# Patient Record
Sex: Male | Born: 1940 | Race: White | Hispanic: No | Marital: Married | Smoking: Current every day smoker
Health system: Southern US, Community
[De-identification: ages and names within clinical notes are randomized; demographics above are authoritative.]

## PROBLEM LIST (undated history)

## (undated) DIAGNOSIS — N183 Chronic kidney disease, stage 3 unspecified: Secondary | ICD-10-CM

## (undated) DIAGNOSIS — I509 Heart failure, unspecified: Secondary | ICD-10-CM

## (undated) DIAGNOSIS — E871 Hypo-osmolality and hyponatremia: Secondary | ICD-10-CM

## (undated) DIAGNOSIS — E7800 Pure hypercholesterolemia, unspecified: Secondary | ICD-10-CM

## (undated) DIAGNOSIS — I4891 Unspecified atrial fibrillation: Secondary | ICD-10-CM

## (undated) DIAGNOSIS — I10 Essential (primary) hypertension: Secondary | ICD-10-CM

## (undated) HISTORY — PX: CORONARY ARTERY BYPASS GRAFT: SHX141

## (undated) HISTORY — PX: HERNIA REPAIR: SHX51

---

## 2005-05-31 ENCOUNTER — Ambulatory Visit: Admission: RE | Admit: 2005-05-31 | Discharge: 2005-08-29 | Payer: Self-pay | Admitting: Radiation Oncology

## 2005-06-10 ENCOUNTER — Encounter: Admission: RE | Admit: 2005-06-10 | Discharge: 2005-06-10 | Payer: Self-pay | Admitting: Plastic Surgery

## 2005-07-21 ENCOUNTER — Ambulatory Visit (HOSPITAL_BASED_OUTPATIENT_CLINIC_OR_DEPARTMENT_OTHER): Admission: RE | Admit: 2005-07-21 | Discharge: 2005-07-21 | Payer: Self-pay | Admitting: Urology

## 2015-12-19 ENCOUNTER — Emergency Department (HOSPITAL_BASED_OUTPATIENT_CLINIC_OR_DEPARTMENT_OTHER): Payer: Commercial Managed Care - HMO

## 2015-12-19 ENCOUNTER — Encounter (HOSPITAL_BASED_OUTPATIENT_CLINIC_OR_DEPARTMENT_OTHER): Payer: Self-pay

## 2015-12-19 ENCOUNTER — Inpatient Hospital Stay (HOSPITAL_BASED_OUTPATIENT_CLINIC_OR_DEPARTMENT_OTHER)
Admission: EM | Admit: 2015-12-19 | Discharge: 2015-12-23 | DRG: 390 | Disposition: A | Discharge status: Home / Self Care | Payer: Commercial Managed Care - HMO | Attending: Surgery | Admitting: Surgery

## 2015-12-19 DIAGNOSIS — Z0189 Encounter for other specified special examinations: Secondary | ICD-10-CM

## 2015-12-19 DIAGNOSIS — K5660 Unspecified intestinal obstruction: Principal | ICD-10-CM | POA: Diagnosis present

## 2015-12-19 DIAGNOSIS — R1031 Right lower quadrant pain: Secondary | ICD-10-CM | POA: Diagnosis not present

## 2015-12-19 DIAGNOSIS — K56609 Unspecified intestinal obstruction, unspecified as to partial versus complete obstruction: Secondary | ICD-10-CM

## 2015-12-19 DIAGNOSIS — E7800 Pure hypercholesterolemia, unspecified: Secondary | ICD-10-CM | POA: Diagnosis present

## 2015-12-19 DIAGNOSIS — Z4659 Encounter for fitting and adjustment of other gastrointestinal appliance and device: Secondary | ICD-10-CM

## 2015-12-19 DIAGNOSIS — I10 Essential (primary) hypertension: Secondary | ICD-10-CM | POA: Diagnosis present

## 2015-12-19 DIAGNOSIS — F17200 Nicotine dependence, unspecified, uncomplicated: Secondary | ICD-10-CM | POA: Diagnosis present

## 2015-12-19 DIAGNOSIS — Z951 Presence of aortocoronary bypass graft: Secondary | ICD-10-CM

## 2015-12-19 HISTORY — DX: Pure hypercholesterolemia, unspecified: E78.00

## 2015-12-19 HISTORY — DX: Essential (primary) hypertension: I10

## 2015-12-19 LAB — BASIC METABOLIC PANEL
Anion gap: 12 (ref 5–15)
BUN: 29 mg/dL — AB (ref 6–20)
CHLORIDE: 103 mmol/L (ref 101–111)
CO2: 24 mmol/L (ref 22–32)
CREATININE: 1.44 mg/dL — AB (ref 0.61–1.24)
Calcium: 10 mg/dL (ref 8.9–10.3)
GFR, EST AFRICAN AMERICAN: 54 mL/min — AB (ref >60)
GFR, EST NON AFRICAN AMERICAN: 46 mL/min — AB (ref >60)
Glucose, Bld: 139 mg/dL — ABNORMAL HIGH (ref 65–99)
POTASSIUM: 4 mmol/L (ref 3.5–5.1)
SODIUM: 139 mmol/L (ref 135–145)

## 2015-12-19 LAB — CBC WITH DIFFERENTIAL/PLATELET
BASOS PCT: 0 %
Basophils Absolute: 0 10*3/uL (ref 0.0–0.1)
EOS ABS: 0 10*3/uL (ref 0.0–0.7)
EOS PCT: 0 %
HCT: 45.5 % (ref 39.0–52.0)
HEMOGLOBIN: 15.5 g/dL (ref 13.0–17.0)
Lymphocytes Relative: 10 %
Lymphs Abs: 0.8 10*3/uL (ref 0.7–4.0)
MCH: 29.3 pg (ref 26.0–34.0)
MCHC: 34.1 g/dL (ref 30.0–36.0)
MCV: 86 fL (ref 78.0–100.0)
Monocytes Absolute: 0.2 10*3/uL (ref 0.1–1.0)
Monocytes Relative: 2 %
NEUTROS PCT: 88 %
Neutro Abs: 7.3 10*3/uL (ref 1.7–7.7)
PLATELETS: 372 10*3/uL (ref 150–400)
RBC: 5.29 MIL/uL (ref 4.22–5.81)
RDW: 14.5 % (ref 11.5–15.5)
WBC: 8.3 10*3/uL (ref 4.0–10.5)

## 2015-12-19 LAB — URINALYSIS, ROUTINE W REFLEX MICROSCOPIC
BILIRUBIN URINE: NEGATIVE
GLUCOSE, UA: NEGATIVE mg/dL
HGB URINE DIPSTICK: NEGATIVE
KETONES UR: 15 mg/dL — AB
Leukocytes, UA: NEGATIVE
NITRITE: NEGATIVE
PH: 6.5 (ref 5.0–8.0)
Protein, ur: 100 mg/dL — AB
SPECIFIC GRAVITY, URINE: 1.035 — AB (ref 1.005–1.030)

## 2015-12-19 LAB — URINE MICROSCOPIC-ADD ON: Bacteria, UA: NONE SEEN

## 2015-12-19 MED ORDER — LIDOCAINE VISCOUS 2 % MT SOLN
15.0000 mL | Freq: Once | OROMUCOSAL | Status: AC
  Administered 2015-12-19: 15 mL via OROMUCOSAL
  Filled 2015-12-19: qty 15

## 2015-12-19 MED ORDER — ONDANSETRON HCL 4 MG/2ML IJ SOLN
4.0000 mg | Freq: Once | INTRAMUSCULAR | Status: AC
  Administered 2015-12-19: 4 mg via INTRAVENOUS
  Filled 2015-12-19: qty 2

## 2015-12-19 MED ORDER — MORPHINE SULFATE (PF) 4 MG/ML IV SOLN
4.0000 mg | INTRAVENOUS | Status: DC | PRN
  Administered 2015-12-19 – 2015-12-20 (×2): 4 mg via INTRAVENOUS
  Filled 2015-12-19 (×2): qty 1

## 2015-12-19 MED ORDER — FENTANYL CITRATE (PF) 100 MCG/2ML IJ SOLN
50.0000 ug | Freq: Once | INTRAMUSCULAR | Status: AC
  Administered 2015-12-19: 50 ug via INTRAVENOUS
  Filled 2015-12-19: qty 2

## 2015-12-19 MED ORDER — IOPAMIDOL (ISOVUE-300) INJECTION 61%
80.0000 mL | Freq: Once | INTRAVENOUS | Status: AC | PRN
  Administered 2015-12-19: 80 mL via INTRAVENOUS

## 2015-12-19 MED ORDER — SODIUM CHLORIDE 0.9 % IV BOLUS (SEPSIS)
500.0000 mL | Freq: Once | INTRAVENOUS | Status: AC
  Administered 2015-12-19: 500 mL via INTRAVENOUS

## 2015-12-19 NOTE — ED Notes (Signed)
MD at bedside. 

## 2015-12-19 NOTE — ED Triage Notes (Addendum)
RLQ pain n/v started today-pt grimacing in pain-squirming in chair-steady gait however taken to tx room in w/c

## 2015-12-19 NOTE — ED Provider Notes (Signed)
Emergency Department Provider Note By signing my name below, I, Emmanuella Mensah, attest that this documentation has been prepared under the direction and in the presence of Maia Plan, MD. Electronically Signed: Angelene Giovanni, ED Scribe. 12/19/15. 9:09 PM.   Maia Plan, MD has reviewed the triage vital signs and the nursing notes.   HISTORY  Chief Complaint Abdominal Pain   HPI Comments: Patrick Knox is a 75 y.o. male with a hx of high cholesterol and hypertension who presents to the Emergency Department complaining of sudden onset of gradually worsening RLQ pain onset 3 pm today. He reports associated nausea and multiple episodes of non-bloody vomiting. He adds that he has not been able to urinate since 2 pm. No alleviating factors noted. Pt has not tried any medications PTA. He reports a hx of hernia repair with a mesh in 2010. No anti-coagulant use but pt takes a daily aspirin. He reports NKDA. He denies any fever, chest pain, SOB, pain in BUE/BUE, groin pain, generalized rash, or any open wounds.   Past Medical History:  Diagnosis Date  . High cholesterol   . Hypertension     Patient Active Problem List   Diagnosis Date Noted  . SBO (small bowel obstruction) (HCC) 12/20/2015  . Hypertension   . High cholesterol     Past Surgical History:  Procedure Laterality Date  . CORONARY ARTERY BYPASS GRAFT    . HERNIA REPAIR        Allergies Review of patient's allergies indicates no known allergies.  No family history on file.  Social History Social History  Substance Use Topics  . Smoking status: Current Every Day Smoker  . Smokeless tobacco: Never Used  . Alcohol use No    Review of Systems Constitutional: No fever/chills Eyes: No visual changes. ENT: No sore throat. Cardiovascular: Denies chest pain. Respiratory: Denies shortness of breath. Gastrointestinal: Abdominal pain. Nausea, Vomiting.  No diarrhea.  No constipation. Genitourinary:  Negative for dysuria. Urinary retention Musculoskeletal: Negative for back pain. Skin: Negative for rash. Neurological: Negative for headaches, focal weakness or numbness.  10-point ROS otherwise negative.  ____________________________________________   PHYSICAL EXAM:  VITAL SIGNS: ED Triage Vitals  Enc Vitals Group     BP 12/19/15 2036 (!) 177/104     Pulse Rate 12/19/15 2036 72     Resp 12/19/15 2036 18     Temp 12/19/15 2036 98.2 F (36.8 C)     Temp Source 12/19/15 2036 Oral     SpO2 12/19/15 2036 100 %     Weight 12/19/15 2036 153 lb (69.4 kg)     Height 12/19/15 2036 6' (1.829 m)     Pain Score 12/19/15 2035 10   Constitutional: Alert and oriented. Appears uncomfortable.  Eyes: Conjunctivae are normal.  Head: Atraumatic. Nose: No congestion/rhinnorhea. Mouth/Throat: Mucous membranes are moist.  Oropharynx non-erythematous. Neck: No stridor.   Cardiovascular: Normal rate, regular rhythm. Good peripheral circulation. Grossly normal heart sounds.   Respiratory: Normal respiratory effort.  No retractions. Lungs CTAB. Gastrointestinal: Soft with significant RLQ tenderness. No rebound but some voluntary guarding. No distention.  Genitourinary: Normal exam. No scrotal mass.  Musculoskeletal: No lower extremity tenderness nor edema. No gross deformities of extremities. Neurologic:  Normal speech and language. No gross focal neurologic deficits are appreciated.  Skin:  Skin is warm, dry and intact. No rash noted. Psychiatric: Mood and affect are normal. Speech and behavior are normal.  ____________________________________________ DIAGNOSTIC STUDIES: Oxygen Saturation is 100% on RA, normal  by my interpretation.    COORDINATION OF CARE: 9:07 PM- Pt advised of plan for treatment and pt agrees. Pt will receive lab work, CT abdomen, and bladder US for further evaluation. He will also receive IV fluids, Fentanyl IM, Zofran IM, ann Morphine IM.    LABS (all labs ordered are  listed, but only abnormal results are displayed)  Labs Reviewed  BASIC METABOLIC PANEL - Abnormal; Notable for the following:       Result Value   Glucose, Bld 139 (*)    BUN 29 (*)    Creatinine, Ser 1.44 (*)    GFR calc non Af Amer 46 (*)    GFR calc Af Amer 54 (*)    All other components within normal limits  URINALYSIS, ROUTINE W REFLEX MICROSCOPIC (NOT AT [facility]) - Abnormal; Notable for the following:    Specific Gravity, Urine 1.035 (*)    Ketones, ur 15 (*)    Protein, ur 100 (*)    All other components within normal limits  URINE MICROSCOPIC-ADD ON - Abnormal; Notable for the following:    Squamous Epithelial / LPF 0-5 (*)    Casts HYALINE CASTS (*)    All other components within normal limits  BASIC METABOLIC PANEL - Abnormal; Notable for the following:    Glucose, Bld 139 (*)    BUN 30 (*)    GFR calc non Af Amer 60 (*)    All other components within normal limits  CBC WITH DIFFERENTIAL/PLATELET  CBC   ____________________________________________  EKG  Reviewed in MUSE. No STEMI.  ____________________________________________  RADIOLOGY  Ct Abdomen Pelvis W Contrast  Result Date: 12/19/2015 CLINICAL DATA:  Right lower quadrant pain, nausea and vomiting starting 3 p.m. today EXAM: CT ABDOMEN AND PELVIS WITH CONTRAST TECHNIQUE: Multidetector CT imaging of the abdomen and pelvis was performed using the standard protocol following bolus administration of intravenous contrast. CONTRAST:  80mL ISOVUE-300 IOPAMIDOL (ISOVUE-300) INJECTION 61% COMPARISON:  None. FINDINGS: Lower chest:  Lung bases are unremarkable. Hepatobiliary: Mild fatty infiltration of the liver. No calcified gallstones are noted within gallbladder. Pancreas: Enhanced pancreas is unremarkable. Spleen: Enhanced spleen is unremarkable. Adrenals/Urinary Tract: No adrenal gland mass. Enhanced kidneys are symmetrical in size. There is a cyst in upper pole of the left kidney measures 9 mm. Delayed renal images shows  bilateral renal symmetrical excretion. Bilateral visualized proximal ureter is unremarkable. The urinary bladder is unremarkable. Stomach/Bowel: Oral contrast material is noted within stomach. There are fluid distended small bowel loops in upper and lower abdomen with multiple air-fluid levels. In axial image 49 there is transition point in caliber of small bowel. The terminal ileum is decompressed small caliber. No any contrast material noted within distal small bowel or within colon. Findings are consistent with small bowel obstruction probable due to adhesions. Moderate stool noted in right colon and cecum. No pericecal inflammation. Normal appendix is partially visualized. Some colonic stool noted within rectum. Vascular/Lymphatic: Atherosclerotic calcifications of abdominal aorta iliac arteries are noted. No aortic aneurysm. No retroperitoneal or mesenteric adenopathy. Reproductive: Radiation seeds are noted in prostate gland. Other: No ascites or free abdominal air. Musculoskeletal: No destructive bony lesions are noted. There are degenerative changes lumbar spine. Multilevel disc space flattening with anterior spurring and vacuum disc phenomenon. IMPRESSION: 1. There are multiple distended small bowel loops with air-fluid levels. Axial image 48 there is transition point in caliber of small bowel in mid upper pelvis. Findings are consistent with small bowel obstruction probable due to adhesions.  Terminal ileum is decompressed small caliber. 2. No pericecal inflammation.  Normal appendix. 3. No hydronephrosis or hydroureter. 4. Atherosclerotic calcifications of abdominal aorta and iliac arteries are noted. 5. Radiation seeds are noted in prostate gland region. 6. These results were called by telephone at the time of interpretation on 12/19/2015 at 10:35 pm to Dr. Alona BeneJOSHUA Thomasenia Dowse , who verbally acknowledged these results. Electronically Signed   By: Natasha MeadLiviu  Pop M.D.   On: 12/19/2015 22:35   Dg Abd Portable  1v  Result Date: 12/19/2015 CLINICAL DATA:  Nasogastric tube placement.  Initial encounter. EXAM: PORTABLE ABDOMEN - 1 VIEW COMPARISON:  CT of the abdomen and pelvis performed earlier today at 10:19 p.m. FINDINGS: The patient's enteric tube is noted ending overlying the body of the stomach, with the side port about the gastroesophageal junction. This could be advanced at least 3 cm. The visualized bowel gas pattern is grossly unremarkable. Contrast is noted within the renal calyces. No free intra-abdominal air is seen, though evaluation for free air is limited on a single supine view. Mild degenerative change is noted along the lower lumbar spine. IMPRESSION: Enteric tube noted ending overlying the body of the stomach, with the side port about the gastroesophageal junction. This could be advanced at least 3 cm. Electronically Signed   By: Roanna RaiderJeffery  Chang M.D.   On: 12/19/2015 23:48   Dg Abd Portable 2v  Result Date: 12/20/2015 CLINICAL DATA:  Small bowel obstruction EXAM: PORTABLE ABDOMEN - 2 VIEW COMPARISON:  Yesterday FINDINGS: Nasogastric tube tip overlaps the proximal stomach. Negative for pneumoperitoneum. Persistent small bowel fluid levels and dilated loops. Stable limited gas within the colon. Persistent excretion of urinary contrast. IMPRESSION: 1. Unchanged pattern of small bowel obstruction. 2. Unchanged nasogastric tube with tip over the proximal stomach. Electronically Signed   By: Marnee SpringJonathon  Watts M.D.   On: 12/20/2015 06:49    ____________________________________________   PROCEDURES  Procedure(s) performed:   Procedures  None ____________________________________________   INITIAL IMPRESSION / ASSESSMENT AND PLAN / ED COURSE  Pertinent labs & imaging results that were available during my care of the patient were reviewed by me and considered in my medical decision making (see chart for details).  Patient resents to the emergency department for evaluation of acute onset right  lower quadrant abdominal discomfort. Patient had some associated nausea. He has tenderness to the lower abdomen worse in the right lower quadrant. No significant distention. Plan for IVF and CT abdomen/pelvis.   10:46 PM Spoke with radiology regarding CT. Patient with a complete SBO with adhesions and transition point in the mid-pelvis. Patient unsure who did his hernia surgery but reports it was some [facility]. No active vomiting but discussed needing to place NG tube and be admitted. [facility] discussion regarding treatment and admission.   11:02 PM Spoke with Dr. Carolynne Edouardoth with general surgery. He is [facility] for him to evaluate there prior to admission. Called and spoke with Dr. Clydene PughKnott, ED attending, who will accept the transfer. Appreciate assistance. Placing NG tube now. Will call [facility]. Patient is HDS.  ____________________________________________  FINAL CLINICAL IMPRESSION(S) / ED DIAGNOSES  Final diagnoses:  SBO (small bowel obstruction) (HCC)     MEDICATIONS GIVEN DURING THIS VISIT:  Medications  heparin injection 5,000 Units (not administered)  dextrose 5 % and 0.9 % NaCl with KCl 20 mEq/L infusion ( Intravenous New Bag/Given 12/20/15 0705)  morphine 2 MG/ML injection 1-2 mg (not administered)  ondansetron (ZOFRAN-ODT) disintegrating tablet 4 mg (not administered)    Or  ondansetron (ZOFRAN) injection 4 mg (not administered)  famotidine (PEPCID) IVPB 20 mg premix (not administered)  diatrizoate meglumine-sodium (GASTROGRAFIN) 66-10 % solution 90 mL (not administered)  lip balm (CARMEX) ointment 1 application (not administered)  phenol (CHLORASEPTIC) mouth spray 2 spray (not administered)  menthol-cetylpyridinium (CEPACOL) lozenge 3 mg (not administered)  magic mouthwash (not administered)  alum & mag hydroxide-simeth (MAALOX/MYLANTA) 200-200-20 MG/5ML suspension  30 mL (not administered)  bisacodyl (DULCOLAX) suppository 10 mg (not administered)  metoprolol (LOPRESSOR) injection 5 mg (not administered)  hydrALAZINE (APRESOLINE) injection 5-20 mg (not administered)  fentaNYL (SUBLIMAZE) injection 50 mcg (50 mcg Intravenous Given 12/19/15 2103)  ondansetron (ZOFRAN) injection 4 mg (4 mg Intravenous Given 12/19/15 2103)  sodium chloride 0.9 % bolus 500 mL (0 mLs Intravenous Stopped 12/19/15 2158)  iopamidol (ISOVUE-300) 61 % injection 80 mL (80 mLs Intravenous Contrast Given 12/19/15 2211)  lidocaine (XYLOCAINE) 2 % viscous mouth solution 15 mL (15 mLs Mouth/Throat Given 12/19/15 2315)  fentaNYL (SUBLIMAZE) injection 50 mcg (50 mcg Intravenous Given 12/20/15 0251)     NEW OUTPATIENT MEDICATIONS STARTED DURING THIS VISIT:  None  Performed with assistance of scribe. I have reviewed the documentation and made changes as necessary.   Note:  This document was prepared using Dragon voice recognition software and may include unintentional dictation errors.  Alona BeneJoshua Hedaya Latendresse, MD Emergency Medicine    Maia PlanJoshua G Destiny Hagin, MD 12/20/15 [phone]

## 2015-12-19 NOTE — ED Notes (Signed)
Bladder scan 134 mL.

## 2015-12-19 NOTE — ED Notes (Signed)
MD at bedside discussing results with patient and family. 

## 2015-12-19 NOTE — ED Notes (Signed)
[facility] at this time. [facility] will arrive in approximately 25 minutes

## 2015-12-19 NOTE — ED Notes (Signed)
After pain medication administration, pt seems much more relaxed. Reports pain is "no better."

## 2015-12-19 NOTE — ED Notes (Signed)
Pt transported to CT at this time.

## 2015-12-20 ENCOUNTER — Inpatient Hospital Stay (HOSPITAL_COMMUNITY): Payer: Commercial Managed Care - HMO

## 2015-12-20 ENCOUNTER — Encounter (HOSPITAL_COMMUNITY): Payer: Self-pay | Admitting: Surgery

## 2015-12-20 DIAGNOSIS — K56609 Unspecified intestinal obstruction, unspecified as to partial versus complete obstruction: Secondary | ICD-10-CM | POA: Diagnosis present

## 2015-12-20 DIAGNOSIS — E7800 Pure hypercholesterolemia, unspecified: Secondary | ICD-10-CM | POA: Diagnosis present

## 2015-12-20 DIAGNOSIS — R1031 Right lower quadrant pain: Secondary | ICD-10-CM | POA: Diagnosis present

## 2015-12-20 DIAGNOSIS — F17200 Nicotine dependence, unspecified, uncomplicated: Secondary | ICD-10-CM | POA: Diagnosis present

## 2015-12-20 DIAGNOSIS — Z951 Presence of aortocoronary bypass graft: Secondary | ICD-10-CM | POA: Diagnosis not present

## 2015-12-20 DIAGNOSIS — K5660 Unspecified intestinal obstruction: Secondary | ICD-10-CM | POA: Diagnosis present

## 2015-12-20 DIAGNOSIS — I10 Essential (primary) hypertension: Secondary | ICD-10-CM | POA: Diagnosis present

## 2015-12-20 LAB — BASIC METABOLIC PANEL
Anion gap: 10 (ref 5–15)
BUN: 30 mg/dL — AB (ref 6–20)
CALCIUM: 9.7 mg/dL (ref 8.9–10.3)
CO2: 25 mmol/L (ref 22–32)
CREATININE: 1.17 mg/dL (ref 0.61–1.24)
Chloride: 104 mmol/L (ref 101–111)
GFR calc Af Amer: >60 mL/min (ref >60)
GFR, EST NON AFRICAN AMERICAN: 60 mL/min — AB (ref >60)
GLUCOSE: 139 mg/dL — AB (ref 65–99)
POTASSIUM: 3.8 mmol/L (ref 3.5–5.1)
SODIUM: 139 mmol/L (ref 135–145)

## 2015-12-20 LAB — CBC
HCT: 43.8 % (ref 39.0–52.0)
Hemoglobin: 14.6 g/dL (ref 13.0–17.0)
MCH: 28.5 pg (ref 26.0–34.0)
MCHC: 33.3 g/dL (ref 30.0–36.0)
MCV: 85.4 fL (ref 78.0–100.0)
PLATELETS: 346 10*3/uL (ref 150–400)
RBC: 5.13 MIL/uL (ref 4.22–5.81)
RDW: 14.7 % (ref 11.5–15.5)
WBC: 8.4 10*3/uL (ref 4.0–10.5)

## 2015-12-20 MED ORDER — LIP MEDEX EX OINT
1.0000 | TOPICAL_OINTMENT | Freq: Two times a day (BID) | CUTANEOUS | Status: DC
Start: 2015-12-20 — End: 2015-12-23
  Administered 2015-12-20 – 2015-12-23 (×7): 1 via TOPICAL
  Filled 2015-12-20: qty 7

## 2015-12-20 MED ORDER — DIATRIZOATE MEGLUMINE & SODIUM 66-10 % PO SOLN
90.0000 mL | Freq: Once | ORAL | Status: AC
  Administered 2015-12-20: 90 mL via NASOGASTRIC
  Filled 2015-12-20: qty 90

## 2015-12-20 MED ORDER — METOPROLOL TARTRATE 5 MG/5ML IV SOLN: 5.0000 mg | Freq: Four times a day (QID) | INTRAVENOUS | Status: DC | PRN

## 2015-12-20 MED ORDER — MENTHOL 3 MG MT LOZG
1.0000 | LOZENGE | OROMUCOSAL | Status: DC | PRN
  Filled 2015-12-20: qty 9

## 2015-12-20 MED ORDER — MORPHINE SULFATE (PF) 2 MG/ML IV SOLN
1.0000 mg | INTRAVENOUS | Status: DC | PRN
  Administered 2015-12-20: 2 mg via INTRAVENOUS
  Filled 2015-12-20: qty 1

## 2015-12-20 MED ORDER — HEPARIN SODIUM (PORCINE) 5000 UNIT/ML IJ SOLN
5000.0000 [IU] | Freq: Three times a day (TID) | INTRAMUSCULAR | Status: DC
  Administered 2015-12-20 – 2015-12-23 (×9): 5000 [IU] via SUBCUTANEOUS
  Filled 2015-12-20 (×9): qty 1

## 2015-12-20 MED ORDER — BISACODYL 10 MG RE SUPP: 10.0000 mg | Freq: Two times a day (BID) | RECTAL | Status: DC | PRN

## 2015-12-20 MED ORDER — KCL IN DEXTROSE-NACL 20-5-0.9 MEQ/L-%-% IV SOLN
INTRAVENOUS | Status: DC
  Administered 2015-12-20 – 2015-12-23 (×7): via INTRAVENOUS
  Filled 2015-12-20 (×9): qty 1000

## 2015-12-20 MED ORDER — FAMOTIDINE IN NACL 20-0.9 MG/50ML-% IV SOLN
20.0000 mg | Freq: Two times a day (BID) | INTRAVENOUS | Status: DC
  Administered 2015-12-20 – 2015-12-22 (×6): 20 mg via INTRAVENOUS
  Filled 2015-12-20 (×7): qty 50

## 2015-12-20 MED ORDER — MAGIC MOUTHWASH
15.0000 mL | Freq: Four times a day (QID) | ORAL | Status: DC | PRN
  Filled 2015-12-20: qty 15

## 2015-12-20 MED ORDER — ONDANSETRON 4 MG PO TBDP: 4.0000 mg | ORAL_TABLET | Freq: Four times a day (QID) | ORAL | Status: DC | PRN

## 2015-12-20 MED ORDER — FENTANYL CITRATE (PF) 100 MCG/2ML IJ SOLN
50.0000 ug | Freq: Once | INTRAMUSCULAR | Status: AC
  Administered 2015-12-20: 50 ug via INTRAVENOUS
  Filled 2015-12-20: qty 2

## 2015-12-20 MED ORDER — HYDRALAZINE HCL 20 MG/ML IJ SOLN: 5.0000 mg | Freq: Four times a day (QID) | INTRAMUSCULAR | Status: DC | PRN

## 2015-12-20 MED ORDER — ONDANSETRON HCL 4 MG/2ML IJ SOLN
4.0000 mg | Freq: Four times a day (QID) | INTRAMUSCULAR | Status: DC | PRN
Start: 2015-12-20 — End: 2015-12-23
  Administered 2015-12-20 – 2015-12-21 (×2): 4 mg via INTRAVENOUS
  Filled 2015-12-20 (×2): qty 2

## 2015-12-20 MED ORDER — PHENOL 1.4 % MT LIQD
2.0000 | OROMUCOSAL | Status: DC | PRN
  Filled 2015-12-20: qty 177

## 2015-12-20 MED ORDER — ALUM & MAG HYDROXIDE-SIMETH 200-200-20 MG/5ML PO SUSP: 30.0000 mL | Freq: Four times a day (QID) | ORAL | Status: DC | PRN

## 2015-12-20 NOTE — ED Notes (Signed)
[facility]

## 2015-12-20 NOTE — H&P (Signed)
Patrick Knox is an 75 y.o. male.   Chief Complaint: vomiting HPI:  The patient is a 75 year old black male who began feeling bad yesterday afternoon. He describes some lower abdominal pain. He has also been having some nausea and vomiting. His last bowel movement was yesterday. He has never had an episode like this before. He denies any fevers or chills. He went to the emergency department where a CT scan was consistent with a small bowel obstruction. He has never had abdominal surgery before. An NG tube is in place.  Past Medical History:  Diagnosis Date  . High cholesterol   . Hypertension     Past Surgical History:  Procedure Laterality Date  . CORONARY ARTERY BYPASS GRAFT    . HERNIA REPAIR      No family history on file. Social History:  reports that he has been smoking.  He has never used smokeless tobacco. He reports that he does not drink alcohol or use drugs.  Allergies: No Known Allergies   (Not in a hospital admission)  Results for orders placed or performed during the hospital encounter of 12/19/15 (from the past 48 hour(s))  CBC with Differential     Status: None   Collection Time: 12/19/15  8:52 PM  Result Value Ref Range   WBC 8.3 4.0 - 10.5 K/uL   RBC 5.29 4.22 - 5.81 MIL/uL   Hemoglobin 15.5 13.0 - 17.0 g/dL   HCT 45.5 39.0 - 52.0 %   MCV 86.0 78.0 - 100.0 fL   MCH 29.3 26.0 - 34.0 pg   MCHC 34.1 30.0 - 36.0 g/dL   RDW 14.5 11.5 - 15.5 %   Platelets 372 150 - 400 K/uL   Neutrophils Relative % 88 %   Neutro Abs 7.3 1.7 - 7.7 K/uL   Lymphocytes Relative 10 %   Lymphs Abs 0.8 0.7 - 4.0 K/uL   Monocytes Relative 2 %   Monocytes Absolute 0.2 0.1 - 1.0 K/uL   Eosinophils Relative 0 %   Eosinophils Absolute 0.0 0.0 - 0.7 K/uL   Basophils Relative 0 %   Basophils Absolute 0.0 0.0 - 0.1 K/uL  Basic metabolic panel     Status: Abnormal   Collection Time: 12/19/15  8:52 PM  Result Value Ref Range   Sodium 139 135 - 145 mmol/L   Potassium 4.0 3.5 - 5.1 mmol/L    Chloride 103 101 - 111 mmol/L   CO2 24 22 - 32 mmol/L   Glucose, Bld 139 (H) 65 - 99 mg/dL   BUN 29 (H) 6 - 20 mg/dL   Creatinine, Ser 1.44 (H) 0.61 - 1.24 mg/dL   Calcium 10.0 8.9 - 10.3 mg/dL   GFR calc non Af Amer 46 (L) >60 mL/min   GFR calc Af Amer 54 (L) >60 mL/min    Comment: (NOTE) The eGFR has been calculated using the CKD EPI equation. This calculation has not been validated in all clinical situations. eGFR's persistently <60 mL/min signify possible Chronic Kidney Disease.    Anion gap 12 5 - 15  Urinalysis, Routine w reflex microscopic (not at [facility])     Status: Abnormal   Collection Time: 12/19/15 10:35 PM  Result Value Ref Range   Color, Urine YELLOW YELLOW   APPearance CLEAR CLEAR   Specific Gravity, Urine 1.035 (H) 1.005 - 1.030   pH 6.5 5.0 - 8.0   Glucose, UA NEGATIVE NEGATIVE mg/dL   Hgb urine dipstick NEGATIVE NEGATIVE   Bilirubin Urine NEGATIVE  NEGATIVE   Ketones, ur 15 (A) NEGATIVE mg/dL   Protein, ur 100 (A) NEGATIVE mg/dL   Nitrite NEGATIVE NEGATIVE   Leukocytes, UA NEGATIVE NEGATIVE  Urine microscopic-add on     Status: Abnormal   Collection Time: 12/19/15 10:35 PM  Result Value Ref Range   Squamous Epithelial / LPF 0-5 (A) NONE SEEN   WBC, UA 0-5 0 - 5 WBC/hpf   RBC / HPF 0-5 0 - 5 RBC/hpf   Bacteria, UA NONE SEEN NONE SEEN   Casts HYALINE CASTS (A) NEGATIVE    Comment: GRANULAR CAST   Ct Abdomen Pelvis W Contrast  Result Date: 12/19/2015 CLINICAL DATA:  Right lower quadrant pain, nausea and vomiting starting 3 p.m. today EXAM: CT ABDOMEN AND PELVIS WITH CONTRAST TECHNIQUE: Multidetector CT imaging of the abdomen and pelvis was performed using the standard protocol following bolus administration of intravenous contrast. CONTRAST:  4m ISOVUE-300 IOPAMIDOL (ISOVUE-300) INJECTION 61% COMPARISON:  None. FINDINGS: Lower chest:  Lung bases are unremarkable. Hepatobiliary: Mild fatty infiltration of the liver. No calcified gallstones are noted within  gallbladder. Pancreas: Enhanced pancreas is unremarkable. Spleen: Enhanced spleen is unremarkable. Adrenals/Urinary Tract: No adrenal gland mass. Enhanced kidneys are symmetrical in size. There is a cyst in upper pole of the left kidney measures 9 mm. Delayed renal images shows bilateral renal symmetrical excretion. Bilateral visualized proximal ureter is unremarkable. The urinary bladder is unremarkable. Stomach/Bowel: Oral contrast material is noted within stomach. There are fluid distended small bowel loops in upper and lower abdomen with multiple air-fluid levels. In axial image 49 there is transition point in caliber of small bowel. The terminal ileum is decompressed small caliber. No any contrast material noted within distal small bowel or within colon. Findings are consistent with small bowel obstruction probable due to adhesions. Moderate stool noted in right colon and cecum. No pericecal inflammation. Normal appendix is partially visualized. Some colonic stool noted within rectum. Vascular/Lymphatic: Atherosclerotic calcifications of abdominal aorta iliac arteries are noted. No aortic aneurysm. No retroperitoneal or mesenteric adenopathy. Reproductive: Radiation seeds are noted in prostate gland. Other: No ascites or free abdominal air. Musculoskeletal: No destructive bony lesions are noted. There are degenerative changes lumbar spine. Multilevel disc space flattening with anterior spurring and vacuum disc phenomenon. IMPRESSION: 1. There are multiple distended small bowel loops with air-fluid levels. Axial image 48 there is transition point in caliber of small bowel in mid upper pelvis. Findings are consistent with small bowel obstruction probable due to adhesions. Terminal ileum is decompressed small caliber. 2. No pericecal inflammation.  Normal appendix. 3. No hydronephrosis or hydroureter. 4. Atherosclerotic calcifications of abdominal aorta and iliac arteries are noted. 5. Radiation seeds are noted in  prostate gland region. 6. These results were called by telephone at the time of interpretation on 12/19/2015 at 10:35 pm to Dr. JNanda Quinton, who verbally acknowledged these results. Electronically Signed   By: LLahoma CrockerM.D.   On: 12/19/2015 22:35   Dg Abd Portable 1v  Result Date: 12/19/2015 CLINICAL DATA:  Nasogastric tube placement.  Initial encounter. EXAM: PORTABLE ABDOMEN - 1 VIEW COMPARISON:  CT of the abdomen and pelvis performed earlier today at 10:19 p.m. FINDINGS: The patient's enteric tube is noted ending overlying the body of the stomach, with the side port about the gastroesophageal junction. This could be advanced at least 3 cm. The visualized bowel gas pattern is grossly unremarkable. Contrast is noted within the renal calyces. No free intra-abdominal air is seen, though evaluation for  free air is limited on a single supine view. Mild degenerative change is noted along the lower lumbar spine. IMPRESSION: Enteric tube noted ending overlying the body of the stomach, with the side port about the gastroesophageal junction. This could be advanced at least 3 cm. Electronically Signed   By: Garald Balding M.D.   On: 12/19/2015 23:48    Review of Systems  Constitutional: Negative.   HENT: Negative.   Eyes: Negative.   Respiratory: Negative.   Cardiovascular: Negative.   Gastrointestinal: Positive for abdominal pain, nausea and vomiting.  Genitourinary: Negative.   Musculoskeletal: Negative.   Skin: Negative.   Neurological: Negative.   Endo/Heme/Allergies: Negative.   Psychiatric/Behavioral: Negative.     Blood pressure 172/92, pulse 67, temperature 98.4 F (36.9 C), resp. rate 14, height 6' (1.829 m), weight 69.4 kg (153 lb), SpO2 99 %. Physical Exam  Constitutional: He is oriented to person, place, and time. He appears well-developed and well-nourished.  HENT:  Head: Normocephalic and atraumatic.  Eyes: Conjunctivae and EOM are normal. Pupils are equal, round, and reactive to  light.  Neck: Normal range of motion. Neck supple.  Cardiovascular: Normal rate, regular rhythm and normal heart sounds.   Respiratory: Effort normal and breath sounds normal.  GI: Soft.  There is no distension. There is mild RLQ tenderness but no guarding  Musculoskeletal: Normal range of motion.  Neurological: He is alert and oriented to person, place, and time.  Skin: Skin is warm and dry.  Psychiatric: He has a normal mood and affect. His behavior is normal.     Assessment/Plan  The patient appears to have a small bowel obstruction. The etiology of this is unclear given that he has not had previous abdominal surgery. The CT scan does not show any evidence of hernia or mass. I would agree with admitting him for bowel rest and close monitoring. We will consider starting the small bowel protocol in the morning after repeating his abdominal x-rays.  Merrie Roof, MD 12/20/2015, 3:47 AM

## 2015-12-20 NOTE — ED Notes (Signed)
Report given to PorterBobby, RN 6E.

## 2015-12-20 NOTE — Progress Notes (Signed)
[facility]  [address] [place], [place] [address] Phone: [phone] FAX: [phone]   Patrick Knox [id number] [birth year]  CARE TEAM:  PCP: Irven Shelling, MD  Outpatient Care Team: Patient Care Team: Lavone Orn, MD as PCP - General (Internal Medicine)  Inpatient Treatment Team: Treatment Team: Attending Provider: Nolon Nations, MD; Attending Physician: Nolon Nations, MD; Registered Nurse: Yvette Rack, RN; Technician: Jefm Bryant, NT  Problem List:   Active Problems:   SBO (small bowel obstruction) ([place])           Assessment  Persistent SBO  Plan:  -NGT -IVF -SBO protocol -HTN control - PRN IV med -VTE prophylaxis- SCDs, etc -mobilize as tolerated to help recovery  [name], M.D., F.A.C.S. Gastrointestinal and Minimally Invasive Surgery [facility] [address], [place], [place] [address] [phone] Main / Paging   12/20/2015  Subjective:  Sleeping No emesis  Objective:  Vital signs:  Vitals:   12/19/15 2321 12/19/15 2333 12/20/15 0504 12/20/15 0549  BP: 173/95 172/92 189/95 183/87  Pulse: 67 67 71 65  Resp: _0 Temp:  98.4 F (36.9 C)  99.3 F (37.4 C)  TempSrc:    Oral  SpO2: 99% 99% 99% 100%  Weight:      Height:        Last BM Date: 12/19/15  Intake/Output   Yesterday:  08/18 0701 - 08/19 0700 In: -  Out: 475 [Emesis/NG output:475] This shift:  No intake/output data recorded.  Bowel function:  Flatus: No  BM:  No  Drain: NGT with old contrast   Physical Exam:  General: Pt awake/alert/oriented x4 in No acute distress Eyes: PERRL, normal EOM.  Sclera clear.  No icterus Neuro: CN II-XII intact w/o focal sensory/motor deficits. Lymph: No head/neck/groin lymphadenopathy Psych:  No delerium/psychosis/paranoia HENT: Normocephalic, Mucus membranes moist.  No thrush Neck: Supple, No tracheal deviation Chest: No  chest wall pain w good excursion CV:  Pulses intact.  Regular rhythm MS: Normal AROM mjr joints.  No obvious deformity Abdomen: Soft.  Moderately distended.  Nontender.  No evidence of peritonitis.  No incarcerated hernias. Ext:  SCDs BLE.  No mjr edema.  No cyanosis Skin: No petechiae / purpura  Results:   Labs: Results for orders placed or performed during the hospital encounter of 12/19/15 (from the past 48 hour(s))  CBC with Differential     Status: None   Collection Time: 12/19/15  8:52 PM  Result Value Ref Range   WBC 8.3 4.0 - 10.5 K/uL   RBC 5.29 4.22 - 5.81 MIL/uL   Hemoglobin 15.5 13.0 - 17.0 g/dL   HCT 45.5 39.0 - 52.0 %   MCV 86.0 78.0 - 100.0 fL   MCH 29.3 26.0 - 34.0 pg   MCHC 34.1 30.0 - 36.0 g/dL   RDW 14.5 11.5 - 15.5 %   Platelets 372 150 - 400 K/uL   Neutrophils Relative % 88 %   Neutro Abs 7.3 1.7 - 7.7 K/uL   Lymphocytes Relative 10 %   Lymphs Abs 0.8 0.7 - 4.0 K/uL   Monocytes Relative 2 %   Monocytes Absolute 0.2 0.1 - 1.0 K/uL   Eosinophils Relative 0 %   Eosinophils Absolute 0.0 0.0 - 0.7 K/uL   Basophils Relative 0 %   Basophils Absolute 0.0 0.0 - 0.1 K/uL  Basic metabolic panel     Status: Abnormal   Collection Time:  12/19/15  8:52 PM  Result Value Ref Range   Sodium 139 135 - 145 mmol/L   Potassium 4.0 3.5 - 5.1 mmol/L   Chloride 103 101 - 111 mmol/L   CO2 24 22 - 32 mmol/L   Glucose, Bld 139 (H) 65 - 99 mg/dL   BUN 29 (H) 6 - 20 mg/dL   Creatinine, Ser 1.44 (H) 0.61 - 1.24 mg/dL   Calcium 10.0 8.9 - 10.3 mg/dL   GFR calc non Af Amer 46 (L) >60 mL/min   GFR calc Af Amer 54 (L) >60 mL/min    Comment: (NOTE) The eGFR has been calculated using the CKD EPI equation. This calculation has not been validated in all clinical situations. eGFR's persistently <60 mL/min signify possible Chronic Kidney Disease.    Anion gap 12 5 - 15  Urinalysis, Routine w reflex microscopic (not at [facility])     Status: Abnormal   Collection Time: 12/19/15 10:35 PM   Result Value Ref Range   Color, Urine YELLOW YELLOW   APPearance CLEAR CLEAR   Specific Gravity, Urine 1.035 (H) 1.005 - 1.030   pH 6.5 5.0 - 8.0   Glucose, UA NEGATIVE NEGATIVE mg/dL   Hgb urine dipstick NEGATIVE NEGATIVE   Bilirubin Urine NEGATIVE NEGATIVE   Ketones, ur 15 (A) NEGATIVE mg/dL   Protein, ur 100 (A) NEGATIVE mg/dL   Nitrite NEGATIVE NEGATIVE   Leukocytes, UA NEGATIVE NEGATIVE  Urine microscopic-add on     Status: Abnormal   Collection Time: 12/19/15 10:35 PM  Result Value Ref Range   Squamous Epithelial / LPF 0-5 (A) NONE SEEN   WBC, UA 0-5 0 - 5 WBC/hpf   RBC / HPF 0-5 0 - 5 RBC/hpf   Bacteria, UA NONE SEEN NONE SEEN   Casts HYALINE CASTS (A) NEGATIVE    Comment: GRANULAR CAST  Basic metabolic panel     Status: Abnormal   Collection Time: 12/20/15  6:00 AM  Result Value Ref Range   Sodium 139 135 - 145 mmol/L   Potassium 3.8 3.5 - 5.1 mmol/L   Chloride 104 101 - 111 mmol/L   CO2 25 22 - 32 mmol/L   Glucose, Bld 139 (H) 65 - 99 mg/dL   BUN 30 (H) 6 - 20 mg/dL   Creatinine, Ser 1.17 0.61 - 1.24 mg/dL   Calcium 9.7 8.9 - 10.3 mg/dL   GFR calc non Af Amer 60 (L) >60 mL/min   GFR calc Af Amer >60 >60 mL/min    Comment: (NOTE) The eGFR has been calculated using the CKD EPI equation. This calculation has not been validated in all clinical situations. eGFR's persistently <60 mL/min signify possible Chronic Kidney Disease.    Anion gap 10 5 - 15  CBC     Status: None   Collection Time: 12/20/15  6:00 AM  Result Value Ref Range   WBC 8.4 4.0 - 10.5 K/uL   RBC 5.13 4.22 - 5.81 MIL/uL   Hemoglobin 14.6 13.0 - 17.0 g/dL   HCT 43.8 39.0 - 52.0 %   MCV 85.4 78.0 - 100.0 fL   MCH 28.5 26.0 - 34.0 pg   MCHC 33.3 30.0 - 36.0 g/dL   RDW 14.7 11.5 - 15.5 %   Platelets 346 150 - 400 K/uL    Imaging / Studies: Ct Abdomen Pelvis W Contrast  Result Date: 12/19/2015 CLINICAL DATA:  Right lower quadrant pain, nausea and vomiting starting 3 p.m. today EXAM: CT  ABDOMEN AND PELVIS WITH  CONTRAST TECHNIQUE: Multidetector CT imaging of the abdomen and pelvis was performed using the standard protocol following bolus administration of intravenous contrast. CONTRAST:  23m ISOVUE-300 IOPAMIDOL (ISOVUE-300) INJECTION 61% COMPARISON:  None. FINDINGS: Lower chest:  Lung bases are unremarkable. Hepatobiliary: Mild fatty infiltration of the liver. No calcified gallstones are noted within gallbladder. Pancreas: Enhanced pancreas is unremarkable. Spleen: Enhanced spleen is unremarkable. Adrenals/Urinary Tract: No adrenal gland mass. Enhanced kidneys are symmetrical in size. There is a cyst in upper pole of the left kidney measures 9 mm. Delayed renal images shows bilateral renal symmetrical excretion. Bilateral visualized proximal ureter is unremarkable. The urinary bladder is unremarkable. Stomach/Bowel: Oral contrast material is noted within stomach. There are fluid distended small bowel loops in upper and lower abdomen with multiple air-fluid levels. In axial image 49 there is transition point in caliber of small bowel. The terminal ileum is decompressed small caliber. No any contrast material noted within distal small bowel or within colon. Findings are consistent with small bowel obstruction probable due to adhesions. Moderate stool noted in right colon and cecum. No pericecal inflammation. Normal appendix is partially visualized. Some colonic stool noted within rectum. Vascular/Lymphatic: Atherosclerotic calcifications of abdominal aorta iliac arteries are noted. No aortic aneurysm. No retroperitoneal or mesenteric adenopathy. Reproductive: Radiation seeds are noted in prostate gland. Other: No ascites or free abdominal air. Musculoskeletal: No destructive bony lesions are noted. There are degenerative changes lumbar spine. Multilevel disc space flattening with anterior spurring and vacuum disc phenomenon. IMPRESSION: 1. There are multiple distended small bowel loops with  air-fluid levels. Axial image 48 there is transition point in caliber of small bowel in mid upper pelvis. Findings are consistent with small bowel obstruction probable due to adhesions. Terminal ileum is decompressed small caliber. 2. No pericecal inflammation.  Normal appendix. 3. No hydronephrosis or hydroureter. 4. Atherosclerotic calcifications of abdominal aorta and iliac arteries are noted. 5. Radiation seeds are noted in prostate gland region. 6. These results were called by telephone at the time of interpretation on 12/19/2015 at 10:35 pm to Dr. JNanda Quinton, who verbally acknowledged these results. Electronically Signed   By: LLahoma CrockerM.D.   On: 12/19/2015 22:35   Dg Abd Portable 1v  Result Date: 12/19/2015 CLINICAL DATA:  Nasogastric tube placement.  Initial encounter. EXAM: PORTABLE ABDOMEN - 1 VIEW COMPARISON:  CT of the abdomen and pelvis performed earlier today at 10:19 p.m. FINDINGS: The patient's enteric tube is noted ending overlying the body of the stomach, with the side port about the gastroesophageal junction. This could be advanced at least 3 cm. The visualized bowel gas pattern is grossly unremarkable. Contrast is noted within the renal calyces. No free intra-abdominal air is seen, though evaluation for free air is limited on a single supine view. Mild degenerative change is noted along the lower lumbar spine. IMPRESSION: Enteric tube noted ending overlying the body of the stomach, with the side port about the gastroesophageal junction. This could be advanced at least 3 cm. Electronically Signed   By: JGarald BaldingM.D.   On: 12/19/2015 23:48   Dg Abd Portable 2v  Result Date: 12/20/2015 CLINICAL DATA:  Small bowel obstruction EXAM: PORTABLE ABDOMEN - 2 VIEW COMPARISON:  Yesterday FINDINGS: Nasogastric tube tip overlaps the proximal stomach. Negative for pneumoperitoneum. Persistent small bowel fluid levels and dilated loops. Stable limited gas within the colon. Persistent excretion  of urinary contrast. IMPRESSION: 1. Unchanged pattern of small bowel obstruction. 2. Unchanged nasogastric tube with tip over the  proximal stomach. Electronically Signed   By: Monte Fantasia M.D.   On: 12/20/2015 06:49    Medications / Allergies: per chart  Antibiotics: Anti-infectives    None        Note: Portions of this report may have been transcribed using voice recognition software. Every effort was made to ensure accuracy; however, inadvertent computerized transcription errors may be present.   Any transcriptional errors that result from this process are unintentional.     [name], M.D., F.A.C.S. Gastrointestinal and Minimally Invasive Surgery [facility] [address] [place], [place] [address] [phone] Main / Paging   12/20/2015

## 2015-12-20 NOTE — Progress Notes (Signed)
Pt received gastrografin this morning at 0830. He was clamped for two hours and he reported having nausea so was placed back on LIWS and given zofran. Patient hasn't wanted to ambulate, as he says he doesn't feel well. Post 8-hour xray showed the dye in the stomach and not the bowel. Patient still on LIWS.

## 2015-12-20 NOTE — Care Management Note (Signed)
Case Management Note  Patient Details  Name: Lanice SchwabHarvie Nabi MRN: [id number] Date of Birth: 07/14/1940  Subjective/Objective:  Small bowel obstruction                  Action/Plan: Discharge Planning: Chart reviewed. Waiting final recommendations for home. CM will continue to follow for dc needs.   PCP- Kirby FunkGRIFFIN, JOHN MD   Expected Discharge Date:                Expected Discharge Plan:  Home/Self Care  In-House Referral:  NA  Discharge planning Services  CM Consult  Post Acute Care Choice:  NA Choice offered to:  NA  DME Arranged:  N/A DME Agency:  NA  HH Arranged:    HH Agency:     Status of Service:  In process, will continue to follow  If discussed at Long Length of Stay Meetings, dates discussed:    Additional Comments:  Elliot CousinShavis, Asmi Fugere Ellen, RN 12/20/2015, 3:48 PM

## 2015-12-20 NOTE — ED Notes (Signed)
[facility] here at this time 

## 2015-12-20 NOTE — ED Provider Notes (Signed)
Patient was seen and evaluated on arrival to the emergency department.  Dr Carolynne Edouardoth called and will come to evaluate as he accepted Pt.    Lyndal Pulleyaniel Willson Lipa, MD 12/20/15 [phone]

## 2015-12-20 NOTE — ED Notes (Signed)
Attempted report x1, RN in a patient room performing patient care.

## 2015-12-21 MED ORDER — ACETAMINOPHEN 650 MG RE SUPP: 650.0000 mg | Freq: Four times a day (QID) | RECTAL | Status: DC | PRN

## 2015-12-21 MED ORDER — ACETAMINOPHEN 325 MG PO TABS: 325.0000 mg | ORAL_TABLET | Freq: Four times a day (QID) | ORAL | Status: DC | PRN

## 2015-12-21 MED ORDER — METHOCARBAMOL 1000 MG/10ML IJ SOLN
1000.0000 mg | Freq: Four times a day (QID) | INTRAVENOUS | Status: DC | PRN
  Filled 2015-12-21: qty 10

## 2015-12-21 MED ORDER — BISACODYL 10 MG RE SUPP
10.0000 mg | Freq: Every day | RECTAL | Status: DC
  Administered 2015-12-21 – 2015-12-22 (×2): 10 mg via RECTAL
  Filled 2015-12-21 (×2): qty 1

## 2015-12-21 MED ORDER — HYDROMORPHONE HCL 1 MG/ML IJ SOLN: 0.5000 mg | INTRAMUSCULAR | Status: DC | PRN

## 2015-12-21 NOTE — Progress Notes (Signed)
Patrick [address]  [address] [place], [place] [address] Phone: [phone] FAX: [phone]   Patrick Knox [id number] May 15, 1940  CARE TEAM:  PCP: Irven Shelling, MD  Outpatient Care Team: Patient Care Team: Lavone Orn, MD as PCP - General (Internal Medicine)  Inpatient Treatment Team: Treatment Team: Attending Provider: Nolon Nations, MD; Attending Physician: Nolon Nations, MD; Registered Nurse: Yvette Rack, RN; Technician: Jefm Bryant, NT; Technician: April R Juarez, NT; Registered Nurse: Bobbye Charleston, RN  Problem List:   Active Problems:   SBO (small bowel obstruction) ([place])   Hypertension   High cholesterol           Assessment  Persistent SBO - ? better clinically but not by Xray  Plan:  -SBO protocol:  check Xray in AM  If Xray not better & no flatus, may need OR exploration tomorrow.  If better, may try PO.  We will see... -NGT needs to be advanced in - d/w RN -IVF  -HTN control - PRN IV med OK for now -VTE prophylaxis- SCDs, etc -mobilize as tolerated to help recovery  Patrick Knox, M.D., F.A.C.S. Gastrointestinal and Minimally Invasive Surgery [facility] [address], [place], [place] [address] [phone] Main / Paging   12/21/2015  Subjective:  Felt worse but now better No emesis  Objective:  Vital signs:  Vitals:   12/20/15 1403 12/20/15 1833 12/20/15 2311 12/21/15 0613  BP: (!) 180/88 (!) 153/73 (!) 171/68 (!) 160/67  Pulse:  (!) 40  (!) 36  Resp: _0 Temp: 98.6 F (37 C) 98.3 F (36.8 C) 98.9 F (37.2 C) 98.6 F (37 C)  TempSrc: Oral Oral Oral Oral  SpO2: 100% 100% 100% 100%  Weight:      Height:        Last BM Date: 12/19/15  Intake/Output   Yesterday:  08/19 0701 - 08/20 0700 In: 4742 [I.V.:1485; IV Piggyback:100] Out: 1450 [Urine:700; Emesis/NG output:750] This shift:  Total I/O In: -  Out: 200  [Urine:200]  Bowel function:  Flatus: A little bit  BM:  No  Drain: NGT with thick brown/tan output   Physical Exam:  General: Pt awake/alert/oriented x4 in No acute distress Eyes: PERRL, normal EOM.  Sclera clear.  No icterus Neuro: CN II-XII intact w/o focal sensory/motor deficits. Lymph: No head/neck/groin lymphadenopathy Psych:  No delerium/psychosis/paranoia HENT: Normocephalic, Mucus membranes moist.  No thrush Neck: Supple, No tracheal deviation Chest: No chest wall pain w good excursion CV:  Pulses intact.  Regular rhythm MS: Normal AROM mjr joints.  No obvious deformity Abdomen: Soft.  Mildy distended.  Nontender.  No evidence of peritonitis.  No incarcerated hernias. Ext:  SCDs BLE.  No mjr edema.  No cyanosis Skin: No petechiae / purpura  Results:   Labs: Results for orders placed or performed during the hospital encounter of 12/19/15 (from the past 48 hour(s))  CBC with Differential     Status: None   Collection Time: 12/19/15  8:52 PM  Result Value Ref Range   WBC 8.3 4.0 - 10.5 K/uL   RBC 5.29 4.22 - 5.81 MIL/uL   Hemoglobin 15.5 13.0 - 17.0 g/dL   HCT 45.5 39.0 - 52.0 %   MCV 86.0 78.0 - 100.0 fL   MCH 29.3 26.0 - 34.0 pg   MCHC 34.1 30.0 - 36.0 g/dL   RDW 14.5 11.5 - 15.5 %   Platelets 372 150 - 400  K/uL   Neutrophils Relative % 88 %   Neutro Abs 7.3 1.7 - 7.7 K/uL   Lymphocytes Relative 10 %   Lymphs Abs 0.8 0.7 - 4.0 K/uL   Monocytes Relative 2 %   Monocytes Absolute 0.2 0.1 - 1.0 K/uL   Eosinophils Relative 0 %   Eosinophils Absolute 0.0 0.0 - 0.7 K/uL   Basophils Relative 0 %   Basophils Absolute 0.0 0.0 - 0.1 K/uL  Basic metabolic panel     Status: Abnormal   Collection Time: 12/19/15  8:52 PM  Result Value Ref Range   Sodium 139 135 - 145 mmol/L   Potassium 4.0 3.5 - 5.1 mmol/L   Chloride 103 101 - 111 mmol/L   CO2 24 22 - 32 mmol/L   Glucose, Bld 139 (H) 65 - 99 mg/dL   BUN 29 (H) 6 - 20 mg/dL   Creatinine, Ser 1.44 (H) 0.61 - 1.24  mg/dL   Calcium 10.0 8.9 - 10.3 mg/dL   GFR calc non Af Amer 46 (L) >60 mL/min   GFR calc Af Amer 54 (L) >60 mL/min    Comment: (NOTE) The eGFR has been calculated using the CKD EPI equation. This calculation has not been validated in all clinical situations. eGFR's persistently <60 mL/min signify possible Chronic Kidney Disease.    Anion gap 12 5 - 15  Urinalysis, Routine w reflex microscopic (not at [facility])     Status: Abnormal   Collection Time: 12/19/15 10:35 PM  Result Value Ref Range   Color, Urine YELLOW YELLOW   APPearance CLEAR CLEAR   Specific Gravity, Urine 1.035 (H) 1.005 - 1.030   pH 6.5 5.0 - 8.0   Glucose, UA NEGATIVE NEGATIVE mg/dL   Hgb urine dipstick NEGATIVE NEGATIVE   Bilirubin Urine NEGATIVE NEGATIVE   Ketones, ur 15 (A) NEGATIVE mg/dL   Protein, ur 100 (A) NEGATIVE mg/dL   Nitrite NEGATIVE NEGATIVE   Leukocytes, UA NEGATIVE NEGATIVE  Urine microscopic-add on     Status: Abnormal   Collection Time: 12/19/15 10:35 PM  Result Value Ref Range   Squamous Epithelial / LPF 0-5 (A) NONE SEEN   WBC, UA 0-5 0 - 5 WBC/hpf   RBC / HPF 0-5 0 - 5 RBC/hpf   Bacteria, UA NONE SEEN NONE SEEN   Casts HYALINE CASTS (A) NEGATIVE    Comment: GRANULAR CAST  Basic metabolic panel     Status: Abnormal   Collection Time: 12/20/15  6:00 AM  Result Value Ref Range   Sodium 139 135 - 145 mmol/L   Potassium 3.8 3.5 - 5.1 mmol/L   Chloride 104 101 - 111 mmol/L   CO2 25 22 - 32 mmol/L   Glucose, Bld 139 (H) 65 - 99 mg/dL   BUN 30 (H) 6 - 20 mg/dL   Creatinine, Ser 1.17 0.61 - 1.24 mg/dL   Calcium 9.7 8.9 - 10.3 mg/dL   GFR calc non Af Amer 60 (L) >60 mL/min   GFR calc Af Amer >60 >60 mL/min    Comment: (NOTE) The eGFR has been calculated using the CKD EPI equation. This calculation has not been validated in all clinical situations. eGFR's persistently <60 mL/min signify possible Chronic Kidney Disease.    Anion gap 10 5 - 15  CBC     Status: None   Collection Time:  12/20/15  6:00 AM  Result Value Ref Range   WBC 8.4 4.0 - 10.5 K/uL   RBC 5.13 4.22 - 5.81 MIL/uL  Hemoglobin 14.6 13.0 - 17.0 g/dL   HCT 43.8 39.0 - 52.0 %   MCV 85.4 78.0 - 100.0 fL   MCH 28.5 26.0 - 34.0 pg   MCHC 33.3 30.0 - 36.0 g/dL   RDW 14.7 11.5 - 15.5 %   Platelets 346 150 - 400 K/uL    Imaging / Studies: Ct Abdomen Pelvis W Contrast  Result Date: 12/19/2015 CLINICAL DATA:  Right lower quadrant pain, nausea and vomiting starting 3 p.m. today EXAM: CT ABDOMEN AND PELVIS WITH CONTRAST TECHNIQUE: Multidetector CT imaging of the abdomen and pelvis was performed using the standard protocol following bolus administration of intravenous contrast. CONTRAST:  67m ISOVUE-300 IOPAMIDOL (ISOVUE-300) INJECTION 61% COMPARISON:  None. FINDINGS: Lower chest:  Lung bases are unremarkable. Hepatobiliary: Mild fatty infiltration of the liver. No calcified gallstones are noted within gallbladder. Pancreas: Enhanced pancreas is unremarkable. Spleen: Enhanced spleen is unremarkable. Adrenals/Urinary Tract: No adrenal gland mass. Enhanced kidneys are symmetrical in size. There is a cyst in upper pole of the left kidney measures 9 mm. Delayed renal images shows bilateral renal symmetrical excretion. Bilateral visualized proximal ureter is unremarkable. The urinary bladder is unremarkable. Stomach/Bowel: Oral contrast material is noted within stomach. There are fluid distended small bowel loops in upper and lower abdomen with multiple air-fluid levels. In axial image 49 there is transition point in caliber of small bowel. The terminal ileum is decompressed small caliber. No any contrast material noted within distal small bowel or within colon. Findings are consistent with small bowel obstruction probable due to adhesions. Moderate stool noted in right colon and cecum. No pericecal inflammation. Normal appendix is partially visualized. Some colonic stool noted within rectum. Vascular/Lymphatic: Atherosclerotic  calcifications of abdominal aorta iliac arteries are noted. No aortic aneurysm. No retroperitoneal or mesenteric adenopathy. Reproductive: Radiation seeds are noted in prostate gland. Other: No ascites or free abdominal air. Musculoskeletal: No destructive bony lesions are noted. There are degenerative changes lumbar spine. Multilevel disc space flattening with anterior spurring and vacuum disc phenomenon. IMPRESSION: 1. There are multiple distended small bowel loops with air-fluid levels. Axial image 48 there is transition point in caliber of small bowel in mid upper pelvis. Findings are consistent with small bowel obstruction probable due to adhesions. Terminal ileum is decompressed small caliber. 2. No pericecal inflammation.  Normal appendix. 3. No hydronephrosis or hydroureter. 4. Atherosclerotic calcifications of abdominal aorta and iliac arteries are noted. 5. Radiation seeds are noted in prostate gland region. 6. These results were called by telephone at the time of interpretation on 12/19/2015 at 10:35 pm to Dr. JNanda Quinton, who verbally acknowledged these results. Electronically Signed   By: LLahoma CrockerM.D.   On: 12/19/2015 22:35   Dg Abd Portable 1v-small Bowel Obstruction Protocol-initial, 8 Hr Delay  Result Date: 12/20/2015 CLINICAL DATA:  Small-bowel obstruction EXAM: PORTABLE ABDOMEN - 1 VIEW COMPARISON:  12/20/2015 FINDINGS: Scattered large and small bowel gas is noted. The degree of small bowel dilatation is relatively stable. No free air is seen. Contrast material is noted within the stomach but has not shown any emptying into the proximal small bowel. Continued follow-up is recommended. IMPRESSION: 8 hour film following contrast administration shows all the contrast within the stomach. Electronically Signed   By: MInez CatalinaM.D.   On: 12/20/2015 16:58   Dg Abd Portable 1v  Result Date: 12/19/2015 CLINICAL DATA:  Nasogastric tube placement.  Initial encounter. EXAM: PORTABLE ABDOMEN - 1  VIEW COMPARISON:  CT of the abdomen and  pelvis performed earlier today at 10:19 p.m. FINDINGS: The patient's enteric tube is noted ending overlying the body of the stomach, with the side port about the gastroesophageal junction. This could be advanced at least 3 cm. The visualized bowel gas pattern is grossly unremarkable. Contrast is noted within the renal calyces. No free intra-abdominal air is seen, though evaluation for free air is limited on a single supine view. Mild degenerative change is noted along the lower lumbar spine. IMPRESSION: Enteric tube noted ending overlying the body of the stomach, with the side port about the gastroesophageal junction. This could be advanced at least 3 cm. Electronically Signed   By: Garald Balding M.D.   On: 12/19/2015 23:48   Dg Abd Portable 2v  Result Date: 12/20/2015 CLINICAL DATA:  Small bowel obstruction EXAM: PORTABLE ABDOMEN - 2 VIEW COMPARISON:  Yesterday FINDINGS: Nasogastric tube tip overlaps the proximal stomach. Negative for pneumoperitoneum. Persistent small bowel fluid levels and dilated loops. Stable limited gas within the colon. Persistent excretion of urinary contrast. IMPRESSION: 1. Unchanged pattern of small bowel obstruction. 2. Unchanged nasogastric tube with tip over the proximal stomach. Electronically Signed   By: Monte Fantasia M.D.   On: 12/20/2015 06:49    Medications / Allergies: per chart  Antibiotics: Anti-infectives    None        Note: Portions of this report may have been transcribed using voice recognition software. Every effort was made to ensure accuracy; however, inadvertent computerized transcription errors may be present.   Any transcriptional errors that result from this process are unintentional.     Patrick Knox, M.D., F.A.C.S. Gastrointestinal and Minimally Invasive Surgery [facility] [address], [place],  [address] [phone] Main /  Paging   12/21/2015

## 2015-12-22 ENCOUNTER — Inpatient Hospital Stay (HOSPITAL_COMMUNITY): Payer: Commercial Managed Care - HMO

## 2015-12-22 LAB — CBC
HCT: 42.7 % (ref 39.0–52.0)
Hemoglobin: 14.1 g/dL (ref 13.0–17.0)
MCH: 28.8 pg (ref 26.0–34.0)
MCHC: 33 g/dL (ref 30.0–36.0)
MCV: 87.1 fL (ref 78.0–100.0)
PLATELETS: 269 10*3/uL (ref 150–400)
RBC: 4.9 MIL/uL (ref 4.22–5.81)
RDW: 14.7 % (ref 11.5–15.5)
WBC: 9.5 10*3/uL (ref 4.0–10.5)

## 2015-12-22 LAB — BASIC METABOLIC PANEL
Anion gap: 5 (ref 5–15)
BUN: 20 mg/dL (ref 6–20)
CO2: 29 mmol/L (ref 22–32)
Calcium: 9.1 mg/dL (ref 8.9–10.3)
Chloride: 109 mmol/L (ref 101–111)
Creatinine, Ser: 1.19 mg/dL (ref 0.61–1.24)
GFR, EST NON AFRICAN AMERICAN: 58 mL/min — AB (ref >60)
Glucose, Bld: 128 mg/dL — ABNORMAL HIGH (ref 65–99)
Potassium: 4.6 mmol/L (ref 3.5–5.1)
SODIUM: 143 mmol/L (ref 135–145)

## 2015-12-22 LAB — MAGNESIUM: MAGNESIUM: 1.7 mg/dL (ref 1.7–2.4)

## 2015-12-22 NOTE — Progress Notes (Signed)
Subjective: He feels much better. Abdomen is no longer distended. BM 2 since yesterday. NG drainage looks mostly like ice chips.  Objective: Vital signs in last 24 hours: Temp:  [98.7 F (37.1 C)-99.7 F (37.6 C)] 99.3 F (37.4 C) (08/21 0532) Pulse Rate:  [65-70] 69 (08/21 0532) Resp:  [16-20] 20 (08/21 0532) BP: (155-159)/(66-83) 156/70 (08/21 0532) SpO2:  [100 %] 100 % (08/21 0532) Last BM Date: 12/22/15 Nothing by mouth BM 1 yesterday and again this a.m. 675 urine recorded Afebrile vital signs stable Labs are all normal Film a few dilated small bowel loops in the upper abdomen contrast seen in the bowel and colon extending to the rectum. NG tube in place. Intake/Output from previous day: 08/20 0701 - 08/21 0700 In: 751.8 [I.V.:703; IV Piggyback:48.8] Out: 1126 [Urine:675; Emesis/NG output:450; Stool:1] Intake/Output this shift: Total I/O In: 0  Out: 170 [Emesis/NG output:170]  General appearance: alert, cooperative and no distress Resp: clear to auscultation bilaterally GI: soft, non-tender; bowel sounds normal; no masses,  no organomegaly  Lab Results:   Recent Labs  12/20/15 0600 12/22/15 0457  WBC 8.4 9.5  HGB 14.6 14.1  HCT 43.8 42.7  PLT 346 269    BMET  Recent Labs  12/20/15 0600 12/22/15 0457  NA 139 143  K 3.8 4.6  CL 104 109  CO2 25 29  GLUCOSE 139* 128*  BUN 30* 20  CREATININE 1.17 1.19  CALCIUM 9.7 9.1   PT/INR No results for input(s): LABPROT, INR in the last 72 hours.  No results for input(s): AST, ALT, ALKPHOS, BILITOT, PROT, ALBUMIN in the last 168 hours.   Lipase  No results found for: LIPASE   Studies/Results: Dg Abd Acute W/chest  Result Date: 12/22/2015 CLINICAL DATA:  Small bowel obstruction EXAM: DG ABDOMEN ACUTE W/ 1V CHEST COMPARISON:  Chest radiograph June 10, 2005; abdominal image December 20, 2015 FINDINGS: PA chest: There is slight left base scarring. Lungs elsewhere clear. Heart size and pulmonary  vascularity are normal. No adenopathy. Patient is status post coronary artery bypass grafting. Supine and upright abdomen: Nasogastric tube tip and side port are in stomach. Contrast is seen throughout the colon and rectum. There remain a few loops of mildly dilated small bowel in the left upper quadrant. Most small bowel loops appear normal. There are for scattered air-fluid levels. No free air. There are seed implants in the prostate. IMPRESSION: Findings consistent with resolving bowel obstruction. There remain a few dilated small bowel loops in the left upper abdomen with scattered air-fluid levels. Contrast is then bowel seen throughout the colon extending to the rectum. No free air. Nasogastric tube tip and side port in stomach. Seed implants noted in prostate. No lung edema or consolidation. Stable mild scarring left base. Electronically Signed   By: Bretta BangWilliam  Woodruff III M.D.   On: 12/22/2015 08:43   Dg Abd Portable 1v-small Bowel Obstruction Protocol-initial, 8 Hr Delay  Result Date: 12/20/2015 CLINICAL DATA:  Small-bowel obstruction EXAM: PORTABLE ABDOMEN - 1 VIEW COMPARISON:  12/20/2015 FINDINGS: Scattered large and small bowel gas is noted. The degree of small bowel dilatation is relatively stable. No free air is seen. Contrast material is noted within the stomach but has not shown any emptying into the proximal small bowel. Continued follow-up is recommended. IMPRESSION: 8 hour film following contrast administration shows all the contrast within the stomach. Electronically Signed   By: Alcide CleverMark  Lukens M.D.   On: 12/20/2015 16:58   Prior to Admission medications  Medication Sig Start Date End Date Taking? Authorizing Provider  aspirin EC 81 MG tablet Take 81 mg by mouth daily.   Yes Historical Provider, MD  hydrochlorothiazide (MICROZIDE) 12.5 MG capsule Take 1 capsule by mouth every morning. 12/02/15  Yes Historical Provider, MD  rosuvastatin (CRESTOR) 20 MG tablet Take 1 tablet by mouth every  evening. 12/02/15  Yes Historical Provider, MD    Medications: . bisacodyl  10 mg Rectal Daily  . famotidine (PEPCID) IV  20 mg Intravenous Q12H  . heparin  5,000 Units Subcutaneous Q8H  . lip balm  1 application Topical BID   . dextrose 5 % and 0.9 % NaCl with KCl 20 mEq/L 100 mL/hr at 12/22/15 0436    Assessment/Plan SBO  Hx of hernia repair Hypertension FEN: NPO ID: None DVT:  Heparin  Plan: Clamping NG maybe remove it later. Clear liquids and advance him as tolerated. I told her to get up and walk the halls as much as he can today. He asked about going home. I told him if he progresses maybe tomorrow.     LOS: 2 days    Weslee Fogg 12/22/2015 [phone]

## 2015-12-23 NOTE — Discharge Summary (Signed)
Physician Discharge Summary  Patient ID: Patrick Knox MRN: [id number] DOB/AGE: 74/10/1940 75 y.o.  Admit date: 12/19/2015 Discharge date: 12/23/2015  Admission Diagnoses:  SBO Hx of hernia repair Hypertension  Discharge Diagnoses:  SBO Active Problems:   SBO (small bowel obstruction) (HCC)   Hypertension   High cholesterol   PROCEDURES: None  Hospital Course:  The patient is a 75 year old black male who began feeling bad yesterday afternoon. He describes some lower abdominal pain. He has also been having some nausea and vomiting. His last bowel movement was yesterday. He has never had an episode like this before. He denies any fevers or chills. He went to the emergency department where a CT scan was consistent with a small bowel obstruction. He has never had abdominal surgery before. An NG tube is in place. Pt was seen in the ED, NG had already been placed.  He was admitted and placed on bowel rest, hydration and NG decompression. He was placed on the small bowel protocol. Contrast was seen in the bowel and colon to the rectum on the 8 hour film.  He has 2 BM's with the study by the following AM.  His NG was clamped and then removed.  He was advanced to full liquids and he ask to go home that AM after advancing to Full liquids. He can follow up with PCP and our office if he has further issues.  He was placed back on pre admit medicines as before.    Condition on D/C:  Improved  Disposition: 01-Home or Self Care     Medication List    TAKE these medications   aspirin EC 81 MG tablet Take 81 mg by mouth daily.   hydrochlorothiazide 12.5 MG capsule Commonly known as:  MICROZIDE Take 1 capsule by mouth every morning.   rosuvastatin 20 MG tablet Commonly known as:  CRESTOR Take 1 tablet by mouth every evening.      Follow-up Information    Lillia MountainGRIFFIN,JOHN JOSEPH, MD .   Specialty:  Internal Medicine Why:  Call for follow-up for medical issues. Contact information: [address] [place] [address] [phone]        [facility] .   Specialty:  General Surgery Why:  [facility] to care of you during your hospitalization. You do not need to follow-up. If you have recurrent issues with small bowel obstruction return to the emergency department for evaluation. Contact information: [address] [place] [address] [phone]           Signed: Sherrie GeorgeJENNINGS,Aleena Kirkeby 12/23/2015, 4:20 PM

## 2015-12-23 NOTE — Discharge Instructions (Signed)
Soft-Food Meal Plan for the next week then resume your regular diet. A soft-food meal plan includes foods that are safe and easy to swallow. This meal plan typically is used:  If you are having trouble chewing or swallowing foods.  As a transition meal plan after only having had liquid meals for a long period. WHAT DO I NEED TO KNOW ABOUT THE SOFT-FOOD MEAL PLAN? A soft-food meal plan includes tender foods that are soft and easy to chew and swallow. In most cases, bite-sized pieces of food are easier to swallow. A bite-sized piece is about  inch or smaller. Foods in this plan do not need to be ground or pureed. Foods that are very hard, crunchy, or sticky should be avoided. Also, breads, cereals, yogurts, and desserts with nuts, seeds, or fruits should be avoided. WHAT FOODS CAN I EAT? Grains Rice and wild rice. Moist bread, dressing, pasta, and noodles. Well-moistened dry or cooked cereals, such as farina (cooked wheat cereal), oatmeal, or grits. Biscuits, breads, muffins, pancakes, and waffles that have been well moistened. Vegetables Shredded lettuce. Cooked, tender vegetables, including potatoes without skins. Vegetable juices. Broths or creamed soups made with vegetables that are not stringy or chewy. Strained tomatoes (without seeds). Fruits Canned or well-cooked fruits. Soft (ripe), peeled fresh fruits, such as peaches, nectarines, kiwi, cantaloupe, honeydew melon, and watermelon (without seeds). Soft berries with small seeds, such as strawberries. Fruit juices (without pulp). Meats and Other Protein Sources Moist, tender, lean beef. Mutton. Lamb. Veal. Chicken. [place]key. Liver. Ham. Fish without bones. Eggs. Dairy Milk, milk drinks, and cream. Plain cream cheese and cottage cheese. Plain yogurt. Sweets/Desserts Flavored gelatin desserts. Custard. Plain ice cream, frozen yogurt, sherbet, milk shakes, and malts. Plain cakes and cookies. Plain hard candy.  Other Butter, margarine (without  trans fat), and cooking oils. Mayonnaise. Cream sauces. Mild spices, salt, and sugar. Syrup, molasses, honey, and jelly. The items listed above may not be a complete list of recommended foods or beverages. Contact your dietitian for more options. WHAT FOODS ARE NOT RECOMMENDED? Grains Dry bread, toast, crackers that have not been moistened. Coarse or dry cereals, such as bran, granola, and shredded wheat. Tough or chewy crusty breads, such as [place] bread or baguettes. Vegetables Corn. Raw vegetables except shredded lettuce. Cooked vegetables that are tough or stringy. Tough, crisp, fried potatoes and potato skins. Fruits Fresh fruits with skins or seeds or both, such as apples, pears, or grapes. Stringy, high-pulp fruits, such as papaya, pineapple, coconut, or mango. Fruit leather, fruit roll-ups, and all dried fruits. Meats and Other Protein Sources Sausages and hot dogs. Meats with gristle. Fish with bones. Nuts, seeds, and chunky peanut or other nut butters. Sweets/Desserts Cakes or cookies that are very dry or chewy.  The items listed above may not be a complete list of foods and beverages to avoid. Contact your dietitian for more information.   This information is not intended to replace advice given to you by your health care provider. Make sure you discuss any questions you have with your health care provider.   Document Released: 07/27/2007 Document Revised: 04/24/2013 Document Reviewed: 03/16/2013 Elsevier Interactive Patient Education [employer]2016 Elsevier Inc.

## 2015-12-23 NOTE — Progress Notes (Signed)
  Subjective: He feels good, no abdominal discomfort, tolerating full liquids. Normal bowel movement again. He would like to go home.  Objective: Vital signs in last 24 hours: Temp:  [98.5 F (36.9 C)-99.9 F (37.7 C)] 98.7 F (37.1 C) (08/22 0652) Pulse Rate:  [50-123] 50 (08/22 0652) Resp:  [16-18] 16 (08/22 0652) BP: (147-165)/(62-71) 147/69 (08/22 0652) SpO2:  [96 %-100 %] 100 % (08/22 0652) Last BM Date: 12/22/15 720 PO BM x 1 Afebrile, VSS Labs OK yesterday  Intake/Output from previous day: 08/21 0701 - 08/22 0700 In: 3218.3 [P.O.:720; I.V.:2398.3; IV Piggyback:100] Out: 250 [Emesis/NG output:250] Intake/Output this shift: No intake/output data recorded.  General appearance: alert, cooperative and no distress Resp: clear to auscultation bilaterally GI: soft, non-tender; bowel sounds normal; no masses,  no organomegaly  Lab Results:   Recent Labs  12/22/15 0457  WBC 9.5  HGB 14.1  HCT 42.7  PLT 269    BMET  Recent Labs  12/22/15 0457  NA 143  K 4.6  CL 109  CO2 29  GLUCOSE 128*  BUN 20  CREATININE 1.19  CALCIUM 9.1   PT/INR No results for input(s): LABPROT, INR in the last 72 hours.  No results for input(s): AST, ALT, ALKPHOS, BILITOT, PROT, ALBUMIN in the last 168 hours.   Lipase  No results found for: LIPASE   Studies/Results: Dg Abd Acute W/chest  Result Date: 12/22/2015 CLINICAL DATA:  Small bowel obstruction EXAM: DG ABDOMEN ACUTE W/ 1V CHEST COMPARISON:  Chest radiograph June 10, 2005; abdominal image December 20, 2015 FINDINGS: PA chest: There is slight left base scarring. Lungs elsewhere clear. Heart size and pulmonary vascularity are normal. No adenopathy. Patient is status post coronary artery bypass grafting. Supine and upright abdomen: Nasogastric tube tip and side port are in stomach. Contrast is seen throughout the colon and rectum. There remain a few loops of mildly dilated small bowel in the left upper quadrant. Most small  bowel loops appear normal. There are for scattered air-fluid levels. No free air. There are seed implants in the prostate. IMPRESSION: Findings consistent with resolving bowel obstruction. There remain a few dilated small bowel loops in the left upper abdomen with scattered air-fluid levels. Contrast is then bowel seen throughout the colon extending to the rectum. No free air. Nasogastric tube tip and side port in stomach. Seed implants noted in prostate. No lung edema or consolidation. Stable mild scarring left base. Electronically Signed   By: Bretta BangWilliam  Woodruff III M.D.   On: 12/22/2015 08:43    Medications: . bisacodyl  10 mg Rectal Daily  . famotidine (PEPCID) IV  20 mg Intravenous Q12H  . heparin  5,000 Units Subcutaneous Q8H  . lip balm  1 application Topical BID    Assessment/Plan SBO  Hx of hernia repair Hypertension FEN: full liquids ID: None DVT:  Heparin    Plan: Discharge home today soft diet, follow-up with PCP.   LOS: 3 days    Voncile Schwarz 12/23/2015 [phone]

## 2015-12-23 NOTE — Progress Notes (Signed)
Nursing Discharge Summary  Patient ID: Patrick Knox MRN: [id number] DOB/AGE: 74/10/1940 75 y.o.  Admit date: 12/19/2015 Discharge date: 12/23/2015  Discharged Condition: good  Disposition: Final discharge disposition not confirmed  Follow-up Information    Lillia MountainGRIFFIN,JOHN JOSEPH, MD .   Specialty:  Internal Medicine Why:  Call for follow-up for medical issues. Contact information: [address] [place] [address] [phone]        [facility] .   Specialty:  General Surgery Why:  [facility] to care of you during your hospitalization. You do not need to follow-up. If you have recurrent issues with small bowel obstruction return to the emergency department for evaluation. Contact information: [address] [place] [address] [phone]           Prescriptions Given: No prescriptions given - no change in medications.  Follow up appointments and medications discussed.  Patient verbalized understanding without further questions.   Means of Discharge: Patient to be discharged via private vehicle.   Signed: Gloriajean DellBaldwin, Billey Wojciak Danielle 12/23/2015, 9:42 AM

## 2016-02-17 ENCOUNTER — Inpatient Hospital Stay (HOSPITAL_BASED_OUTPATIENT_CLINIC_OR_DEPARTMENT_OTHER)
Admission: EM | Admit: 2016-02-17 | Discharge: 2016-02-19 | DRG: 244 | Disposition: A | Discharge status: Home / Self Care | Payer: Commercial Managed Care - HMO | Attending: Cardiology | Admitting: Cardiology

## 2016-02-17 ENCOUNTER — Encounter (HOSPITAL_BASED_OUTPATIENT_CLINIC_OR_DEPARTMENT_OTHER): Payer: Self-pay

## 2016-02-17 DIAGNOSIS — R001 Bradycardia, unspecified: Secondary | ICD-10-CM | POA: Diagnosis present

## 2016-02-17 DIAGNOSIS — R55 Syncope and collapse: Secondary | ICD-10-CM | POA: Diagnosis present

## 2016-02-17 DIAGNOSIS — I442 Atrioventricular block, complete: Principal | ICD-10-CM | POA: Diagnosis present

## 2016-02-17 DIAGNOSIS — E785 Hyperlipidemia, unspecified: Secondary | ICD-10-CM | POA: Diagnosis present

## 2016-02-17 DIAGNOSIS — F17210 Nicotine dependence, cigarettes, uncomplicated: Secondary | ICD-10-CM | POA: Diagnosis present

## 2016-02-17 DIAGNOSIS — I2510 Atherosclerotic heart disease of native coronary artery without angina pectoris: Secondary | ICD-10-CM | POA: Diagnosis present

## 2016-02-17 DIAGNOSIS — I255 Ischemic cardiomyopathy: Secondary | ICD-10-CM

## 2016-02-17 DIAGNOSIS — I509 Heart failure, unspecified: Secondary | ICD-10-CM | POA: Diagnosis not present

## 2016-02-17 DIAGNOSIS — I10 Essential (primary) hypertension: Secondary | ICD-10-CM | POA: Diagnosis present

## 2016-02-17 DIAGNOSIS — Z7982 Long term (current) use of aspirin: Secondary | ICD-10-CM

## 2016-02-17 DIAGNOSIS — Z8249 Family history of ischemic heart disease and other diseases of the circulatory system: Secondary | ICD-10-CM

## 2016-02-17 DIAGNOSIS — I459 Conduction disorder, unspecified: Secondary | ICD-10-CM

## 2016-02-17 DIAGNOSIS — Z951 Presence of aortocoronary bypass graft: Secondary | ICD-10-CM

## 2016-02-17 DIAGNOSIS — Z959 Presence of cardiac and vascular implant and graft, unspecified: Secondary | ICD-10-CM

## 2016-02-17 LAB — I-STAT CG4 LACTIC ACID, ED: LACTIC ACID, VENOUS: 1.18 mmol/L (ref 0.5–1.9)

## 2016-02-17 LAB — CBC WITH DIFFERENTIAL/PLATELET
BASOS ABS: 0 10*3/uL (ref 0.0–0.1)
Basophils Relative: 0 %
EOS ABS: 0.1 10*3/uL (ref 0.0–0.7)
EOS PCT: 2 %
HCT: 43.8 % (ref 39.0–52.0)
Hemoglobin: 14.8 g/dL (ref 13.0–17.0)
LYMPHS ABS: 2.4 10*3/uL (ref 0.7–4.0)
LYMPHS PCT: 42 %
MCH: 29.5 pg (ref 26.0–34.0)
MCHC: 33.8 g/dL (ref 30.0–36.0)
MCV: 87.3 fL (ref 78.0–100.0)
MONO ABS: 0.5 10*3/uL (ref 0.1–1.0)
Monocytes Relative: 8 %
Neutro Abs: 2.7 10*3/uL (ref 1.7–7.7)
Neutrophils Relative %: 48 %
PLATELETS: 228 10*3/uL (ref 150–400)
RBC: 5.02 MIL/uL (ref 4.22–5.81)
RDW: 15.6 % — AB (ref 11.5–15.5)
WBC: 5.7 10*3/uL (ref 4.0–10.5)

## 2016-02-17 LAB — COMPREHENSIVE METABOLIC PANEL
ALT: 24 U/L (ref 17–63)
AST: 23 U/L (ref 15–41)
Albumin: 4 g/dL (ref 3.5–5.0)
Alkaline Phosphatase: 45 U/L (ref 38–126)
Anion gap: 7 (ref 5–15)
BUN: 22 mg/dL — ABNORMAL HIGH (ref 6–20)
CHLORIDE: 101 mmol/L (ref 101–111)
CO2: 25 mmol/L (ref 22–32)
Calcium: 9.2 mg/dL (ref 8.9–10.3)
Creatinine, Ser: 1.32 mg/dL — ABNORMAL HIGH (ref 0.61–1.24)
GFR, EST AFRICAN AMERICAN: 60 mL/min — AB (ref >60)
GFR, EST NON AFRICAN AMERICAN: 51 mL/min — AB (ref >60)
Glucose, Bld: 110 mg/dL — ABNORMAL HIGH (ref 65–99)
POTASSIUM: 4.1 mmol/L (ref 3.5–5.1)
SODIUM: 133 mmol/L — AB (ref 135–145)
Total Bilirubin: 0.7 mg/dL (ref 0.3–1.2)
Total Protein: 7.1 g/dL (ref 6.5–8.1)

## 2016-02-17 LAB — MAGNESIUM: MAGNESIUM: 1.8 mg/dL (ref 1.7–2.4)

## 2016-02-17 LAB — TROPONIN I

## 2016-02-17 LAB — BRAIN NATRIURETIC PEPTIDE: B NATRIURETIC PEPTIDE 5: 68 pg/mL (ref 0.0–100.0)

## 2016-02-17 LAB — MRSA PCR SCREENING: MRSA by PCR: NEGATIVE

## 2016-02-17 MED ORDER — SODIUM CHLORIDE 0.9% FLUSH: 3.0000 mL | INTRAVENOUS | Status: DC | PRN

## 2016-02-17 MED ORDER — ONDANSETRON HCL 4 MG/2ML IJ SOLN: 4.0000 mg | Freq: Four times a day (QID) | INTRAMUSCULAR | Status: DC | PRN

## 2016-02-17 MED ORDER — SODIUM CHLORIDE 0.9 % IV SOLN
1.0000 g | Freq: Once | INTRAVENOUS | Status: AC
  Administered 2016-02-17: 1 g via INTRAVENOUS
  Filled 2016-02-17: qty 10

## 2016-02-17 MED ORDER — SODIUM CHLORIDE 0.9 % IV SOLN: 250.0000 mL | INTRAVENOUS | Status: DC | PRN

## 2016-02-17 MED ORDER — ATROPINE SULFATE 1 MG/ML IJ SOLN
1.0000 mg | Freq: Once | INTRAMUSCULAR | Status: AC
  Administered 2016-02-17: 1 mg via INTRAVENOUS

## 2016-02-17 MED ORDER — ASPIRIN EC 81 MG PO TBEC
81.0000 mg | DELAYED_RELEASE_TABLET | Freq: Every day | ORAL | Status: DC
  Administered 2016-02-18 – 2016-02-19 (×2): 81 mg via ORAL
  Filled 2016-02-17 (×2): qty 1

## 2016-02-17 MED ORDER — ACETAMINOPHEN 325 MG PO TABS: 650.0000 mg | ORAL_TABLET | ORAL | Status: DC | PRN

## 2016-02-17 MED ORDER — SODIUM CHLORIDE 0.9% FLUSH
3.0000 mL | Freq: Two times a day (BID) | INTRAVENOUS | Status: DC
  Administered 2016-02-17 – 2016-02-19 (×4): 3 mL via INTRAVENOUS

## 2016-02-17 MED ORDER — ATROPINE SULFATE 1 MG/ML IJ SOLN
INTRAMUSCULAR | Status: AC
  Filled 2016-02-17: qty 1

## 2016-02-17 NOTE — ED Notes (Signed)
Pt placed on pads and atropine at bedside.

## 2016-02-17 NOTE — Progress Notes (Signed)
eLink Physician-Brief Progress Note Patient Name: Patrick Knox DOB: 08/16/1940 MRN: [id number]   Date of Service  02/17/2016  HPI/Events of Note  Pt admitted to Cardiology with [facility]. Admission note pending  eICU Interventions  Will assist in monitoring     Intervention Category Major Interventions: Arrhythmia - evaluation and management Evaluation Type: New Patient Evaluation  Merwyn KatosDavid B Simonds 02/17/2016, 4:22 PM

## 2016-02-17 NOTE — ED Provider Notes (Signed)
[facility] Note   CSN: [id number] Arrival date & time: 02/17/16  1249     History   Chief Complaint Chief Complaint  Patient presents with  . Dizziness    HPI Patrick Knox is a 75 y.o. male.  HPI Reports lightheadedness over the last month intermittently, not associated with anything. Had episode of syncope on Sunday. No chest pain, no shortness of breath. Does not take any beta blockers/calcium channel blockers or digoxin. No known kidney disease.  Reports intermittent lightheadedness that worsened today.  Past Medical History:  Diagnosis Date  . High cholesterol   . Hypertension     Patient Active Problem List   Diagnosis Date Noted  . Bradycardia 02/17/2016  . 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 Heart block   . SBO (small bowel obstruction) 12/20/2015  . Hypertension   . High cholesterol     Past Surgical History:  Procedure Laterality Date  . CORONARY ARTERY BYPASS GRAFT    . HERNIA REPAIR         Home Medications    Prior to Admission medications   Medication Sig Start Date End Date Taking? Authorizing Provider  aspirin EC 81 MG tablet Take 81 mg by mouth daily.    Historical Provider, MD  hydrochlorothiazide (MICROZIDE) 12.5 MG capsule Take 1 capsule by mouth every morning. 12/02/15   Historical Provider, MD  rosuvastatin (CRESTOR) 20 MG tablet Take 1 tablet by mouth every evening. 12/02/15   Historical Provider, MD    Family History No family history on file.  Social History Social History  Substance Use Topics  . Smoking status: Current Every Day Smoker    Types: Cigarettes  . Smokeless tobacco: Never Used  . Alcohol use No     Allergies   Review of patient's allergies indicates no known allergies.   Review of Systems Review of Systems  Constitutional: Negative for fever.  HENT: Negative for sore throat.   Eyes: Negative for visual disturbance.  Respiratory: Negative for shortness of breath.     Cardiovascular: Negative for chest pain.  Gastrointestinal: Negative for abdominal pain, diarrhea, nausea and vomiting.  Genitourinary: Negative for difficulty urinating.  Musculoskeletal: Negative for back pain and neck stiffness.  Skin: Negative for rash.  Neurological: Positive for syncope and light-headedness. Negative for headaches.     Physical Exam Updated Vital Signs BP 125/60   Pulse (!) 47   Temp 98 F (36.7 C) (Oral)   Resp 15   Ht 6\' 1"  (1.854 m)   Wt 148 lb 9.4 oz (67.4 kg)   SpO2 100%   BMI 19.60 kg/m   Physical Exam  Constitutional: He is oriented to person, place, and time. He appears well-developed and well-nourished. No distress.  HENT:  Head: Normocephalic and atraumatic.  Eyes: Conjunctivae and EOM are normal.  Neck: Normal range of motion.  Cardiovascular: Regular rhythm, normal heart sounds and intact distal pulses.  Bradycardia present.  Exam reveals no gallop and no friction rub.   No murmur heard. Pulmonary/Chest: Effort normal and breath sounds normal. No respiratory distress. He has no wheezes. He has no rales.  Abdominal: Soft. He exhibits no distension. There is no tenderness. There is no guarding.  Musculoskeletal: He exhibits no edema.  Neurological: He is alert and oriented to person, place, and time.  Skin: Skin is warm and dry. He is not diaphoretic.  Nursing note and vitals reviewed.    ED Treatments / Results  Labs (all labs ordered  are listed, but only abnormal results are displayed) Labs Reviewed  CBC WITH DIFFERENTIAL/PLATELET - Abnormal; Notable for the following:       Result Value   RDW 15.6 (*)    All other components within normal limits  COMPREHENSIVE METABOLIC PANEL - Abnormal; Notable for the following:    Sodium 133 (*)    Glucose, Bld 110 (*)    BUN 22 (*)    Creatinine, Ser 1.32 (*)    GFR calc non Af Amer 51 (*)    GFR calc Af Amer 60 (*)    All other components within normal limits  MRSA PCR SCREENING   TROPONIN I  BRAIN NATRIURETIC PEPTIDE  MAGNESIUM  BASIC METABOLIC PANEL  CBC  I-STAT CG4 LACTIC ACID, ED  I-STAT CG4 LACTIC ACID, ED    EKG  EKG Interpretation  Date/Time:  Tuesday February 17 2016 13:03:00 EDT Ventricular Rate:  35 PR Interval:    QRS Duration: 175 QT Interval:  601 QTC Calculation: 459 R Axis:   -73 Text Interpretation:  Bradycardia with heart block, unclear type of heart block by ECG, possible complete Ventricular premature complex RBBB and LAFB Rate is slower and heart block is new since prior ECG Confirmed by [facility] MD, Mearl Olver ([id number]) on 02/17/2016 2:17:22 PM       Radiology No results found.  Procedures Procedures (including critical care time)  Medications Ordered in ED Medications  aspirin EC tablet 81 mg (not administered)  acetaminophen (TYLENOL) tablet 650 mg (not administered)  ondansetron (ZOFRAN) injection 4 mg (not administered)  sodium chloride flush (NS) 0.9 % injection 3 mL (not administered)  sodium chloride flush (NS) 0.9 % injection 3 mL (not administered)  0.9 %  sodium chloride infusion (not administered)  atropine injection 1 mg (1 mg Intravenous Given 02/17/16 1318)  calcium gluconate 1 g in sodium chloride 0.9 % 100 mL IVPB (0 g Intravenous Stopped 02/17/16 1351)     Initial Impression / Assessment and Plan / ED Course  I have reviewed the triage vital signs and the nursing notes.  Pertinent labs & imaging results that were available during my care of the patient were reviewed by me and considered in my medical decision making (see chart for details).  Clinical Course   75yo male presents with concern for lightheadedness. Patient bradycardic to 30s on arrival, ECG shows new heart block and more severe bradycardia, unclear type of heart block on ECG however suspect complete heart block. Rhythm strip also concerning for complete heart block.  Morphology of QRS similar to prior.  Pads placed on patient.  Atropine given  without change.  Patient with normal mentation, normal blood pressures, no CP, and continued to monitor closely in ED. HR decreased to 27 however blood pressure and mentation remain stable.  Calcium given empirically initially on arrival as awaiting potassium.  Potassium returned within normal limits. Cardiology consulted emergently and patient transferred to ICU at [facility] for further care.   Final Clinical Impressions(s) / ED Diagnoses   Final diagnoses:  Bradycardia  Heart block    New Prescriptions Current Discharge Medication List       Alvira Monday, MD 02/17/16 2308

## 2016-02-17 NOTE — ED Notes (Signed)
Pt is A/Ox 4, pt denies dizziness at this time, denies SOB.  Pt states he fainted Sunday but didn't get seen.  Pt denies any pain at this time.

## 2016-02-17 NOTE — ED Triage Notes (Signed)
C/o intermittent dizziness x "few months"-has not sought medical treatment-denies HA, CP-NAD-steady gait

## 2016-02-17 NOTE — H&P (Signed)
CARDIOLOGY CONSULT NOTE     Primary Care Physician: Lillia Mountain, MD Referring Physician:  Admit Date: 02/17/2016  Reason for consultation:  Patrick Knox is a 75 y.o. male with a h/o CABG in the 1990s presented to the hospital after being dizzy for the last few days and a syncopal episode on Sunday.  He says that he has not had a change in energy levels in that time.  He cannot say how long he has been feeling bad, only that he felt well prior to his hospitalization in August. He has no chest pain, but does get SOB when walking up to 0.5 miles on flat ground.  He can climb a flight of stairs and has no PND or orthopnea.  He was lost to cardiology follow up in the past.  Today, he denies symptoms of palpitations, chest pain, shortness of breath, orthopnea, PND, lower extremity edema, dizziness, presyncope, syncope, or neurologic sequela. The patient is tolerating medications without difficulties and is otherwise without complaint today.   Past Medical History:  Diagnosis Date  . High cholesterol   . Hypertension    Past Surgical History:  Procedure Laterality Date  . CORONARY ARTERY BYPASS GRAFT    . HERNIA REPAIR         No Known Allergies  Social History   Social History  . Marital status: Married    Spouse name: N/A  . Number of children: N/A  . Years of education: N/A   Occupational History  . Not on file.   Social History Main Topics  . Smoking status: Current Every Day Smoker    Types: Cigarettes  . Smokeless tobacco: Never Used  . Alcohol use No  . Drug use: No  . Sexual activity: Not on file   Other Topics Concern  . Not on file   Social History Narrative  . No narrative on file    Family History: HTN in mother, CAD in brother  ROS- All systems are reviewed and negative except as per the HPI above  Physical Exam: Telemetry: Vitals:   02/17/16 1331 02/17/16 1347 02/17/16 1423 02/17/16 1619  BP: 138/69 136/66 154/66 (!) 133/119  Pulse:  (!) 33 (!) 31 (!) 28 (!) 29  Resp: 18 17 18    Temp:    98 F (36.7 C)  TempSrc:    Oral  SpO2: 100% 100% 100% 100%  Weight:    148 lb 9.4 oz (67.4 kg)  Height:    6\' 1"  (1.854 m)    GEN- The patient is well appearing, alert and oriented x 3 today.   Head- normocephalic, atraumatic Eyes-  Sclera clear, conjunctiva pink Ears- hearing intact Oropharynx- clear Neck- supple, no JVP Lymph- no cervical lymphadenopathy Lungs- Clear to ausculation bilaterally, normal work of breathing Heart- regular, bradycardic, no murmurs, rubs or gallops, PMI not laterally displaced GI- soft, NT, ND, + BS Extremities- no clubbing, cyanosis, or edema MS- no significant deformity or atrophy Skin- no rash or lesion Psych- euthymic mood, full affect Neuro- strength and sensation are intact  EKG: complete AV block with RBBB, LAFB escape  Labs:   Lab Results  Component Value Date   WBC 5.7 02/17/2016   HGB 14.8 02/17/2016   HCT 43.8 02/17/2016   MCV 87.3 02/17/2016   PLT 228 02/17/2016    Recent Labs Lab 02/17/16 1305  NA 133*  K 4.1  CL 101  CO2 25  BUN 22*  CREATININE 1.32*  CALCIUM 9.2  PROT 7.1  BILITOT 0.7  ALKPHOS 45  ALT 24  AST 23  GLUCOSE 110*   Lab Results  Component Value Date   TROPONINI <0.03 02/17/2016   No results found for: CHOL No results found for: HDL No results found for: LDLCALC No results found for: TRIG No results found for: CHOLHDL No results found for: LDLDIRECT    Echo: pending  ASSESSMENT AND PLAN:  1. Complete AV block: currently with escape that resembles his native QRS and thus may be high in the conduction system making it more stable.  Bedside TTE shows EF potentially at 35%.  Athalene Kolle plan for formal TTE tomorrow with possible pacemaker placement. Charnelle Bergeman possibly need CRT depending on echo results.  2. Hyperlipidemia: continue statin  3. Coronary artery disease: no current chest pain.  Danney Bungert need beta blockers at discharge potentially.   Guenther Dunshee  Jorja LoaMartin Quantarius Genrich, MD 02/17/2016  4:30 PM

## 2016-02-17 NOTE — ED Notes (Signed)
Pt states he does have hx of low HR but not in the 30s-taken to treatment area in w/c

## 2016-02-17 NOTE — ED Notes (Signed)
MD at bedside. 

## 2016-02-18 ENCOUNTER — Encounter (HOSPITAL_COMMUNITY): Payer: Self-pay | Admitting: Internal Medicine

## 2016-02-18 ENCOUNTER — Inpatient Hospital Stay (HOSPITAL_COMMUNITY): Payer: Commercial Managed Care - HMO

## 2016-02-18 ENCOUNTER — Encounter (HOSPITAL_COMMUNITY)
Admission: EM | Disposition: A | Discharge status: Home / Self Care | Payer: Self-pay | Source: Home / Self Care | Attending: Cardiology

## 2016-02-18 DIAGNOSIS — I255 Ischemic cardiomyopathy: Secondary | ICD-10-CM

## 2016-02-18 DIAGNOSIS — I442 Atrioventricular block, complete: Principal | ICD-10-CM

## 2016-02-18 DIAGNOSIS — R55 Syncope and collapse: Secondary | ICD-10-CM

## 2016-02-18 DIAGNOSIS — I509 Heart failure, unspecified: Secondary | ICD-10-CM

## 2016-02-18 HISTORY — PX: EP IMPLANTABLE DEVICE: SHX172B

## 2016-02-18 LAB — BASIC METABOLIC PANEL
ANION GAP: 6 (ref 5–15)
BUN: 23 mg/dL — AB (ref 6–20)
CO2: 24 mmol/L (ref 22–32)
Calcium: 9 mg/dL (ref 8.9–10.3)
Chloride: 104 mmol/L (ref 101–111)
Creatinine, Ser: 1.2 mg/dL (ref 0.61–1.24)
GFR, EST NON AFRICAN AMERICAN: 58 mL/min — AB (ref >60)
Glucose, Bld: 124 mg/dL — ABNORMAL HIGH (ref 65–99)
POTASSIUM: 3.9 mmol/L (ref 3.5–5.1)
SODIUM: 134 mmol/L — AB (ref 135–145)

## 2016-02-18 LAB — CBC
HEMATOCRIT: 40.1 % (ref 39.0–52.0)
Hemoglobin: 13.2 g/dL (ref 13.0–17.0)
MCH: 28.6 pg (ref 26.0–34.0)
MCHC: 32.9 g/dL (ref 30.0–36.0)
MCV: 86.8 fL (ref 78.0–100.0)
PLATELETS: 198 10*3/uL (ref 150–400)
RBC: 4.62 MIL/uL (ref 4.22–5.81)
RDW: 15.6 % — AB (ref 11.5–15.5)
WBC: 5.1 10*3/uL (ref 4.0–10.5)

## 2016-02-18 LAB — ECHOCARDIOGRAM COMPLETE
HEIGHTINCHES: 73 in
Weight: 2377.44 oz

## 2016-02-18 LAB — SURGICAL PCR SCREEN
MRSA, PCR: NEGATIVE
Staphylococcus aureus: POSITIVE — AB

## 2016-02-18 SURGERY — BIV PACEMAKER INSERTION CRT-P
Anesthesia: LOCAL

## 2016-02-18 MED ORDER — MIDAZOLAM HCL 5 MG/5ML IJ SOLN
INTRAMUSCULAR | Status: AC
  Filled 2016-02-18: qty 5

## 2016-02-18 MED ORDER — CARVEDILOL 3.125 MG PO TABS
3.1250 mg | ORAL_TABLET | Freq: Two times a day (BID) | ORAL | Status: DC
  Administered 2016-02-18 – 2016-02-19 (×2): 3.125 mg via ORAL
  Filled 2016-02-18 (×2): qty 1

## 2016-02-18 MED ORDER — CHLORHEXIDINE GLUCONATE 4 % EX LIQD
60.0000 mL | Freq: Once | CUTANEOUS | Status: AC
  Administered 2016-02-18: 4 via TOPICAL
  Filled 2016-02-18: qty 60

## 2016-02-18 MED ORDER — CEFAZOLIN SODIUM-DEXTROSE 2-4 GM/100ML-% IV SOLN
2.0000 g | INTRAVENOUS | Status: AC
  Administered 2016-02-18: 2 g via INTRAVENOUS

## 2016-02-18 MED ORDER — HEPARIN (PORCINE) IN NACL 2-0.9 UNIT/ML-% IJ SOLN
INTRAMUSCULAR | Status: AC
  Filled 2016-02-18: qty 500

## 2016-02-18 MED ORDER — CEFAZOLIN IN D5W 1 GM/50ML IV SOLN
1.0000 g | Freq: Four times a day (QID) | INTRAVENOUS | Status: AC
  Administered 2016-02-18 – 2016-02-19 (×3): 1 g via INTRAVENOUS
  Filled 2016-02-18 (×4): qty 50

## 2016-02-18 MED ORDER — ONDANSETRON HCL 4 MG/2ML IJ SOLN: 4.0000 mg | Freq: Four times a day (QID) | INTRAMUSCULAR | Status: DC | PRN

## 2016-02-18 MED ORDER — IOPAMIDOL (ISOVUE-370) INJECTION 76%
INTRAVENOUS | Status: DC | PRN
  Administered 2016-02-18: 16 mL via INTRAVENOUS

## 2016-02-18 MED ORDER — SODIUM CHLORIDE 0.9 % IR SOLN
Status: AC
  Filled 2016-02-18: qty 2

## 2016-02-18 MED ORDER — SODIUM CHLORIDE 0.9 % IR SOLN: 80.0000 mg | Status: DC

## 2016-02-18 MED ORDER — IOPAMIDOL (ISOVUE-370) INJECTION 76%
INTRAVENOUS | Status: AC
  Filled 2016-02-18: qty 50

## 2016-02-18 MED ORDER — SODIUM CHLORIDE 0.9 % IV SOLN
INTRAVENOUS | Status: DC
  Administered 2016-02-18: 09:00:00 via INTRAVENOUS

## 2016-02-18 MED ORDER — CEFAZOLIN SODIUM-DEXTROSE 2-4 GM/100ML-% IV SOLN
INTRAVENOUS | Status: AC
  Filled 2016-02-18: qty 100

## 2016-02-18 MED ORDER — SODIUM CHLORIDE 0.9 % IV SOLN
INTRAVENOUS | Status: AC
  Administered 2016-02-18: 13:00:00 via INTRAVENOUS

## 2016-02-18 MED ORDER — ACETAMINOPHEN 325 MG PO TABS
325.0000 mg | ORAL_TABLET | ORAL | Status: DC | PRN
  Administered 2016-02-18 – 2016-02-19 (×2): 650 mg via ORAL
  Filled 2016-02-18 (×2): qty 2

## 2016-02-18 MED ORDER — FENTANYL CITRATE (PF) 100 MCG/2ML IJ SOLN
INTRAMUSCULAR | Status: DC | PRN
  Administered 2016-02-18 (×2): 25 ug via INTRAVENOUS

## 2016-02-18 MED ORDER — CHLORHEXIDINE GLUCONATE 4 % EX LIQD: 60.0000 mL | Freq: Once | CUTANEOUS | Status: AC

## 2016-02-18 MED ORDER — FENTANYL CITRATE (PF) 100 MCG/2ML IJ SOLN
INTRAMUSCULAR | Status: AC
  Filled 2016-02-18: qty 2

## 2016-02-18 MED ORDER — MIDAZOLAM HCL 5 MG/5ML IJ SOLN
INTRAMUSCULAR | Status: DC | PRN
  Administered 2016-02-18 (×3): 1 mg via INTRAVENOUS

## 2016-02-18 MED ORDER — LIDOCAINE HCL (PF) 1 % IJ SOLN
INTRAMUSCULAR | Status: AC
  Filled 2016-02-18: qty 60

## 2016-02-18 SURGICAL SUPPLY — 20 items
ALLURE CRT PM3262 (Pacemaker) ×2 IMPLANT
BALLN COR SINUS VENO 6FR 80 (BALLOONS) ×2
BALLOON COR SINUS VENO 6FR 80 (BALLOONS) IMPLANT
CABLE SURGICAL S-101-97-12 (CABLE) ×2 IMPLANT
CATH CPS DIRECT 135 DS2C020 (CATHETERS) ×1 IMPLANT
ELECT DEFIB PAD ADLT CADENCE (PAD) ×1 IMPLANT
HEMOSTAT SURGICEL 2X4 FIBR (HEMOSTASIS) ×1 IMPLANT
KIT ESSENTIALS PG (KITS) ×1 IMPLANT
LEAD QUARTET 1458Q-86CM (Lead) ×1 IMPLANT
LEAD TENDRIL MRI 52CM LPA1200M (Lead) ×1 IMPLANT
LEAD TENDRIL MRI 58CM LPA1200M (Lead) ×1 IMPLANT
PACEMAKER ALLURE CRT (Pacemaker) IMPLANT
PAD DEFIB LIFELINK (PAD) ×1 IMPLANT
SHEATH CLASSIC 8F (SHEATH) ×2 IMPLANT
SHEATH CLASSIC 9.5F (SHEATH) ×1 IMPLANT
SHIELD RADPAD SCOOP 12X17 (MISCELLANEOUS) ×1 IMPLANT
SLITTER UNIVERSAL DS2A003 (MISCELLANEOUS) ×1 IMPLANT
TRAY PACEMAKER INSERTION (PACKS) ×1 IMPLANT
WIRE ACUITY WHISPER EDS 4648 (WIRE) ×1 IMPLANT
WIRE HI TORQ VERSACORE-J 145CM (WIRE) ×1 IMPLANT

## 2016-02-18 NOTE — Progress Notes (Signed)
    SUBJECTIVE: The patient is doing well today.  At this time, he denies chest pain, shortness of breath, or any new concerns.  CURRENT MEDICATIONS: . aspirin EC  81 mg Oral Daily  . sodium chloride flush  3 mL Intravenous Q12H      OBJECTIVE: Physical Exam: Vitals:   02/18/16 0200 02/18/16 0300 02/18/16 0400 02/18/16 0500  BP: (!) 116/49   (!) 114/52  Pulse: (!) 30 (!) 29 (!) 31 (!) 30  Resp: 13 12 17 13   Temp:      TempSrc:      SpO2: 99% 100% 100% 100%  Weight:      Height:        Intake/Output Summary (Last 24 hours) at 02/18/16 0721 Last data filed at 02/18/16 0200  Gross per 24 hour  Intake              240 ml  Output              525 ml  Net             -285 ml    Telemetry reveals sinus rhythm with complete heart block   GEN- The patient is well appearing, alert and oriented x 3 today.   Head- normocephalic, atraumatic Eyes-  Sclera clear, conjunctiva pink Ears- hearing intact Oropharynx- clear Neck- supple  Lungs- Clear to ausculation bilaterally, normal work of breathing Heart- Bradycardic regular rate and rhythm  GI- soft, NT, ND, + BS Extremities- no clubbing, cyanosis, or edema Skin- no rash or lesion Psych- euthymic mood, full affect Neuro- strength and sensation are intact  LABS: Basic Metabolic Panel:  Recent Labs  78/29/5610/17/17 1305 02/18/16 0325  NA 133* 134*  K 4.1 3.9  CL 101 104  CO2 25 24  GLUCOSE 110* 124*  BUN 22* 23*  CREATININE 1.32* 1.20  CALCIUM 9.2 9.0  MG 1.8  --    Liver Function Tests:  Recent Labs  02/17/16 1305  AST 23  ALT 24  ALKPHOS 45  BILITOT 0.7  PROT 7.1  ALBUMIN 4.0   CBC:  Recent Labs  02/17/16 1305 02/18/16 0325  WBC 5.7 5.1  NEUTROABS 2.7  --   HGB 14.8 13.2  HCT 43.8 40.1  MCV 87.3 86.8  PLT 228 198   Cardiac Enzymes:  Recent Labs  02/17/16 1305  TROPONINI <0.03    ASSESSMENT AND PLAN:  Active Problems:   Bradycardia   Heart block   Complete heart block (HCC)  1.   Complete heart block with syncope The patient has complete heart block with no reversible causes identified. Plan PPM implant later today. Risks, benefits reviewed with patient who wishes to proceed. Will update echo prior to device implant.   2.  CAD s/p CABG No recent ischemic symptoms Continue ASA, statin Consider BB after pacemaker placement He needs to re-establish with general cardiology.  Will arrange at discharge.  FLP in am  Gypsy BalsamAmber Seiler, NP 02/18/2016 7:24 AM  Seen and examined  CHB in setting of ischemic cardiomyopathy With LV dysfunction will need cath  prob delay following device implant Will need CRT for CHB and LV dysfunction EF 40%  Reviewed withDr DB  The benefits and risks were reviewed including but not limited to death,  perforation, infection, lead dislodgement and device malfunction.  The patient understands agrees and is willing to proceed.

## 2016-02-18 NOTE — Progress Notes (Signed)
EKG CRITICAL VALUE     12 lead EKG performed.  Critical value noted.  Lexi, RN notified.   Oda Coganiara S Woodard, CCT 02/18/2016 7:46 AM

## 2016-02-18 NOTE — Progress Notes (Signed)
  Echocardiogram 2D Echocardiogram has been performed.  Arvil ChacoFoster, Mansour Balboa 02/18/2016, 9:12 AM

## 2016-02-19 ENCOUNTER — Inpatient Hospital Stay (HOSPITAL_COMMUNITY): Payer: Commercial Managed Care - HMO

## 2016-02-19 ENCOUNTER — Telehealth: Payer: Self-pay | Admitting: Nurse Practitioner

## 2016-02-19 MED ORDER — CARVEDILOL 6.25 MG PO TABS: 6.2500 mg | ORAL_TABLET | Freq: Two times a day (BID) | ORAL | 2 refills | Status: DC

## 2016-02-19 MED ORDER — CARVEDILOL 6.25 MG PO TABS: 3.1250 mg | ORAL_TABLET | Freq: Two times a day (BID) | ORAL | 2 refills | Status: DC

## 2016-02-19 MED FILL — Cefazolin Sodium-Dextrose IV Solution 2 GM/100ML-4%: INTRAVENOUS | Qty: 100 | Status: AC

## 2016-02-19 MED FILL — Sodium Chloride Irrigation Soln 0.9%: Qty: 500 | Status: AC

## 2016-02-19 MED FILL — Heparin Sodium (Porcine) 2 Unit/ML in Sodium Chloride 0.9%: INTRAMUSCULAR | Qty: 500 | Status: AC

## 2016-02-19 MED FILL — Lidocaine HCl Local Preservative Free (PF) Inj 1%: INTRAMUSCULAR | Qty: 30 | Status: AC

## 2016-02-19 MED FILL — Gentamicin Sulfate Inj 40 MG/ML: INTRAMUSCULAR | Qty: 2 | Status: AC

## 2016-02-19 NOTE — Telephone Encounter (Signed)
Discharge Medications:    Medication List    TAKE these medications   aspirin EC 81 MG tablet Take 81 mg by mouth daily.  carvedilol 6.25 MG tablet Commonly known as:  COREG Take 1 tablet (6.25 mg total) by mouth 2 (two) times daily with a meal.  hydrochlorothiazide 12.5 MG capsule Commonly known as:  MICROZIDE Take 12.5 mg by mouth every morning.  rosuvastatin 20 MG tablet Commonly known as:  CRESTOR Take 20 mg by mouth every morning.

## 2016-02-19 NOTE — Telephone Encounter (Signed)
New Message:    Needs clarification of his Carvedilol prescription please.

## 2016-02-19 NOTE — Care Management Note (Signed)
Case Management Note  Patient Details  Name: Lanice SchwabHarvie Bye MRN: [id number] Date of Birth: 12/24/1940  Subjective/Objective:          Adm w complete heart block          Action/Plan: lives at home, pacer placed on 10-18   Expected Discharge Date:  02/20/16               Expected Discharge Plan:  Home/Self Care  In-House Referral:     Discharge planning Services     Post Acute Care Choice:    Choice offered to:     DME Arranged:    DME Agency:     HH Arranged:    HH Agency:     Status of Service:  Completed, signed off  If discussed at [employer] Length of Stay Meetings, dates discussed:    Additional Comments:dc home today. No dc needs.  Hanley Haysowell, Wilmont Olund T, RN 02/19/2016, 9:37 AM

## 2016-02-19 NOTE — Telephone Encounter (Signed)
Called the patient and he stated that he has not called the office, so I am not sure why the message was sent indicating that he did. I made him aware that the rx is at the pharmacy, but he stated that he just left the pharmacy and they claim to not have it. I called the pharmacy and they stated that they called the office for clarification as they received two different rx's with different sigs. The latest one that was sent in and per hospital note, the patient is to take one tablet twice a day. I asked that they please refill this and let the patient know when it is ready per his request.

## 2016-02-19 NOTE — Discharge Summary (Signed)
ELECTROPHYSIOLOGY PROCEDURE DISCHARGE SUMMARY    Patient ID: Patrick Knox,  MRN: [id number], DOB/AGE: 1941/02/11 75 y.o.  Admit date: 02/17/2016 Discharge date: 02/19/2016  Primary Care Physician: Patrick Mountain, MD Primary Cardiologist: Patrick Knox (to establish as outpatient) Electrophysiologist: Patrick Knox  Primary Discharge Diagnosis:  Symptomatic complete heart block and LV dysfunction status post CRTP implantation this admission  Secondary Discharge Diagnosis:  1.  CAD s/p CABG  No Known Allergies   Procedures This Admission:  1.  Echo on 02/18/16 demonstrated EF 35-40%, LA mildly dilated, mid/basal inferior wall, septal and apical akinesis 2.  Implantation of a STJ CRTP on 02/18/16 by Dr Patrick Knox.  See op note for full details.  There were no immediate post procedure complications. 2.  CXR on 02/19/16 demonstrated no pneumothorax status post device implantation.   Brief HPI/Knox Course:  Patrick Knox is a 75 y.o. male with a past medical history of CAD s/p CABG.  He has not had cardiology follow up in several years. He presented for evaluation of dizziness and was found to be in complete heart block. He also has had recent syncope. Echocardiogram demonstrated LV dysfunction.  The patient has had symptomatic bradycardia without reversible causes identified.  Risks, benefits, and alternatives to CRTP implantation were reviewed with the patient who wished to proceed. The patient underwent implantation of a STJ CRTP with details as outlined above.  He  was monitored on telemetry overnight which demonstrated AV pacing.  Left chest was without hematoma or ecchymosis.  The device was interrogated and found to be functioning normally.  CXR was obtained and demonstrated no pneumothorax status post device implantation.  Wound care, arm mobility, and restrictions were reviewed with the patient.  The patient was examined and considered stable for discharge to home.   Patrick Knox be  added at discharge and up-titrated as an outpatient. Consideration could be given for outpatient ischemic evaluation, but as the patient has not had recent ischemic symptoms, Patrick Knox defer for now.    Physical Exam: Vitals:   02/18/16 2300 02/19/16 0000 02/19/16 0200 02/19/16 0400  BP: (!) 154/99 136/88 (!) 144/90 140/88  Pulse:      Resp: 18 15 14 15   Temp:  98.8 F (37.1 C)  99 F (37.2 C)  TempSrc:  Oral  Oral  SpO2: 100% 98% 98% 99%  Weight:      Height:        GEN- The patient is well appearing, alert and oriented x 3 today.   HEENT: normocephalic, atraumatic; sclera clear, conjunctiva pink; hearing intact; oropharynx clear; neck supple  Lungs- Clear to ausculation bilaterally, normal work of breathing.  No wheezes, rales, rhonchi Heart- Regular rate and rhythm (paced) GI- soft, non-tender, non-distended, bowel sounds present  Extremities- no clubbing, cyanosis, or edema; DP/PT/radial pulses 2+ bilaterally MS- no significant deformity or atrophy Skin- warm and dry, no rash or lesion, left chest without hematoma/ecchymosis Psych- euthymic mood, full affect Neuro- strength and sensation are intact   Labs:   Lab Results  Component Value Date   WBC 5.1 02/18/2016   HGB 13.2 02/18/2016   HCT 40.1 02/18/2016   MCV 86.8 02/18/2016   PLT 198 02/18/2016     Recent Labs Lab 02/17/16 1305 02/18/16 0325  NA 133* 134*  K 4.1 3.9  CL 101 104  CO2 25 24  BUN 22* 23*  CREATININE 1.32* 1.20  CALCIUM 9.2 9.0  PROT 7.1  --   BILITOT 0.7  --   [facility]  45  --   ALT 24  --   AST 23  --   GLUCOSE 110* 124*    Discharge Medications:    Medication List    TAKE these medications   aspirin EC 81 MG tablet Take 81 mg by mouth daily.   carvedilol 6.25 MG tablet Commonly known as:  COREG Take 1 tablet (6.25 mg total) by mouth 2 (two) times daily with a meal.   hydrochlorothiazide 12.5 MG capsule Commonly known as:  MICROZIDE Take 12.5 mg by mouth every morning.     rosuvastatin 20 MG tablet Commonly known as:  CRESTOR Take 20 mg by mouth every morning.       Disposition:  Discharge Instructions    Diet - low sodium heart healthy    Complete by:  As directed    Increase activity slowly    Complete by:  As directed      Follow-up Information    Patrick MillersBrian Crenshaw, MD Follow up on 03/17/2016.   Specialty:  Cardiology Why:  at [facility]10AM Contact information: [address] [place] [address] [phone]        [facility] [address] [facility] Follow up on 03/03/2016.   Specialty:  Cardiology Why:  at 9:30AM for wound check  Contact information: [address], [address] [place] [id number] [phone]       Patrick Knox Patrick LoaMartin Jameelah Watts, MD Follow up on 05/19/2016.   Specialty:  Cardiology Why:  at [facility]  Contact information: [address] [place] [address] [phone]           Duration of Discharge Encounter: Greater than 30 minutes including physician time.  Signed, Patrick BalsamAmber Seiler, NP 02/19/2016 6:27 AM  I have seen and examined this patient with Patrick Knox.  Agree with above, note added to reflect my findings.  On exam, regular rhythm, no murmurs, lungs clear. Presented to the Knox in heart block.  TTE showed EF 35-40%.  Had CRT-P placed without issues.  Plan for discharge today with follow up in device clinic.  Patrick Knox likely need ischemic workup as has had CABG with new onset heart failure but Patrick Knox defer to outpatient.    Patrick Skilton M. Patrick Osterhout MD 02/19/2016 11:10 AM

## 2016-02-25 ENCOUNTER — Encounter: Payer: Self-pay | Admitting: Cardiology

## 2016-02-25 ENCOUNTER — Ambulatory Visit (INDEPENDENT_AMBULATORY_CARE_PROVIDER_SITE_OTHER): Payer: Commercial Managed Care - HMO | Admitting: Cardiology

## 2016-02-25 VITALS — BP 133/86 | Ht 73.0 in | Wt 155.4 lb

## 2016-02-25 DIAGNOSIS — I25810 Atherosclerosis of coronary artery bypass graft(s) without angina pectoris: Secondary | ICD-10-CM

## 2016-02-25 DIAGNOSIS — I255 Ischemic cardiomyopathy: Secondary | ICD-10-CM

## 2016-02-25 DIAGNOSIS — E7800 Pure hypercholesterolemia, unspecified: Secondary | ICD-10-CM | POA: Diagnosis not present

## 2016-02-25 DIAGNOSIS — I10 Essential (primary) hypertension: Secondary | ICD-10-CM

## 2016-02-25 DIAGNOSIS — Z720 Tobacco use: Secondary | ICD-10-CM | POA: Diagnosis not present

## 2016-02-25 MED ORDER — LISINOPRIL 2.5 MG PO TABS: 2.5000 mg | ORAL_TABLET | Freq: Every day | ORAL | 3 refills | Status: DC

## 2016-02-25 MED ORDER — ROSUVASTATIN CALCIUM 40 MG PO TABS: 40.0000 mg | ORAL_TABLET | ORAL | 3 refills | Status: AC

## 2016-02-25 NOTE — Patient Instructions (Signed)
Medication Instructions:   START LISINOPRIL 2.5 MG ONCE DAILY  INCREASE ROSUVASTATIN TO 40 MG ONCE DAILY= 2 OF THE 20 MG TABLETS ONCE DAILY  Labwork:  Your physician recommends that you return for lab work in: ONE WEEK  Your physician recommends that you return for lab work in: 4 WEEKS AFTER INCREASING ROSUVASTATIN= DO NOT EAT PRIOR TO LAB WORK  Testing/Procedures:  Your physician has requested that you have a lexiscan myoview. For further information please visit [email or web address]. Please follow instruction sheet, as given.    Follow-Up:  Your physician wants you to follow-up in: 6 MONTHS WITH DR Jens SomRENSHAW You will receive a reminder letter in the mail two months in advance. If you don't receive a letter, please call our office to schedule the follow-up appointment.   If you need a refill on your cardiac medications before your next appointment, please call your pharmacy.

## 2016-02-25 NOTE — Progress Notes (Signed)
      HPI: Follow-up coronary artery disease, ischemic cardiomyopathy and CRTP implantation. Patient had coronary artery bypass and graft in the 90s. Abdominal CT August 2017 showed no aneurysm. He was recently admitted with a syncopal episode and was found to be in complete heart block. Echocardiogram October 2017 showed ejection fraction 35-40%, moderate left ventricular enlargement, mild left atrial enlargement. Patient had CRTP placed. Since last seen he denies dyspnea, chest pain, palpitations or syncope. No fevers.   Current Outpatient Prescriptions  Medication Sig Dispense Refill  . aspirin EC 81 MG tablet Take 81 mg by mouth daily.    . carvedilol (COREG) 6.25 MG tablet Take 1 tablet (6.25 mg total) by mouth 2 (two) times daily with a meal. 60 tablet 2  . hydrochlorothiazide (MICROZIDE) 12.5 MG capsule Take 12.5 mg by mouth every morning.     . rosuvastatin (CRESTOR) 20 MG tablet Take 20 mg by mouth every morning.      No current facility-administered medications for this visit.      Past Medical History:  Diagnosis Date  . High cholesterol   . Hypertension     Past Surgical History:  Procedure Laterality Date  . CORONARY ARTERY BYPASS GRAFT    . EP IMPLANTABLE DEVICE N/A 02/18/2016   Procedure: BiV Pacemaker Insertion CRT-P;  Surgeon: Duke SalviaSteven C Klein, MD;  [facility];  Service: Cardiovascular;  Laterality: N/A;  . HERNIA REPAIR      Social History   Social History  . Marital status: Married    Spouse name: N/A  . Number of children: N/A  . Years of education: N/A   Occupational History  . Not on file.   Social History Main Topics  . Smoking status: Current Every Day Smoker    Types: Cigarettes  . Smokeless tobacco: Never Used  . Alcohol use No  . Drug use: No  . Sexual activity: Not on file   Other Topics Concern  . Not on file   Social History Narrative  . No narrative on file    History reviewed. No pertinent family history.  ROS:  no fevers or chills, productive cough, hemoptysis, dysphasia, odynophagia, melena, hematochezia, dysuria, hematuria, rash, seizure activity, orthopnea, PND, pedal edema, claudication. Remaining systems are negative.  Physical Exam: Well-developed well-nourished in no acute distress.  Skin is warm and dry.  HEENT is normal.  Neck is supple.  Chest is clear to auscultation with normal expansion. ICD site with mild tenderness to palpation but no erythema and no discharge.  Cardiovascular exam is regular rate and rhythm.  Abdominal exam nontender or distended. No masses palpated. Extremities show no edema. neuro grossly intact   A/P  1 coronary artery disease s/p CABG-continue aspirin and statin. I will arrange a nuclear study for risk stratification.  2 ischemic cardiomyopathy-continue beta blocker. Add lisinopril 2.5 mg daily. Check potassium and renal function in 1 week. Increase medications as an outpatient as tolerated.   3 status post CRTP-follow-up with electrophysiology.  4 tobacco abuse-patient counseled on discontinuing.  5 hyperlipidemia-increase Crestor to 40 mg daily. Check lipids and liver in 4 weeks.  6 hypertension-blood pressure controlled. Continue present medications.  Olga MillersBrian Crenshaw, MD

## 2016-03-01 ENCOUNTER — Telehealth: Payer: Self-pay | Admitting: Cardiology

## 2016-03-01 NOTE — Telephone Encounter (Signed)
Patient called in regards to correcting his e-mail account listed on demographics. I have completed this correction. This did not need an RN phone call.

## 2016-03-01 NOTE — Telephone Encounter (Signed)
°  New Prob   Pt is requesting additional information regarding his upcoming stress test procedure. Please call.

## 2016-03-03 ENCOUNTER — Encounter: Payer: Self-pay | Admitting: *Deleted

## 2016-03-03 ENCOUNTER — Ambulatory Visit (INDEPENDENT_AMBULATORY_CARE_PROVIDER_SITE_OTHER): Payer: Commercial Managed Care - HMO | Admitting: *Deleted

## 2016-03-03 DIAGNOSIS — Z950 Presence of cardiac pacemaker: Secondary | ICD-10-CM

## 2016-03-03 DIAGNOSIS — I442 Atrioventricular block, complete: Secondary | ICD-10-CM | POA: Diagnosis not present

## 2016-03-03 LAB — CUP PACEART INCLINIC DEVICE CHECK
Battery Remaining Longevity: 50 mo
Battery Voltage: 2.99 V
Implantable Lead Implant Date: 20171018
Implantable Lead Location: 753858
Lead Channel Impedance Value: 400 Ohm
Lead Channel Pacing Threshold Amplitude: 0.75 V
Lead Channel Pacing Threshold Amplitude: 1.375 V
Lead Channel Pacing Threshold Pulse Width: 0.4 ms
Lead Channel Pacing Threshold Pulse Width: 0.4 ms
Lead Channel Sensing Intrinsic Amplitude: 2.1 mV
Lead Channel Setting Pacing Amplitude: 2.375
MDC IDC LEAD IMPLANT DT: 20171018
MDC IDC LEAD IMPLANT DT: 20171018
MDC IDC LEAD LOCATION: 753859
MDC IDC LEAD LOCATION: 753860
MDC IDC MSMT LEADCHNL LV IMPEDANCE VALUE: 687.5 Ohm
MDC IDC MSMT LEADCHNL RA PACING THRESHOLD PULSEWIDTH: 0.4 ms
MDC IDC MSMT LEADCHNL RV IMPEDANCE VALUE: 575 Ohm
MDC IDC MSMT LEADCHNL RV PACING THRESHOLD AMPLITUDE: 0.5 V
MDC IDC PG IMPLANT DT: 20171018
MDC IDC PG SERIAL: 7936214
MDC IDC SESS DTM: 20171101133631
MDC IDC SET LEADCHNL LV PACING PULSEWIDTH: 0.4 ms
MDC IDC SET LEADCHNL RA PACING AMPLITUDE: 3.5 V
MDC IDC SET LEADCHNL RV PACING AMPLITUDE: 3.5 V
MDC IDC SET LEADCHNL RV PACING PULSEWIDTH: 0.4 ms
MDC IDC SET LEADCHNL RV SENSING SENSITIVITY: 5 mV
MDC IDC STAT BRADY RA PERCENT PACED: 73 %
MDC IDC STAT BRADY RV PERCENT PACED: 98 %

## 2016-03-03 LAB — BASIC METABOLIC PANEL
BUN: 16 mg/dL (ref 7–25)
CO2: 28 mmol/L (ref 20–31)
CREATININE: 1.14 mg/dL (ref 0.70–1.18)
Calcium: 9.3 mg/dL (ref 8.6–10.3)
Chloride: 105 mmol/L (ref 98–110)
GLUCOSE: 112 mg/dL — AB (ref 65–99)
POTASSIUM: 5 mmol/L (ref 3.5–5.3)
Sodium: 138 mmol/L (ref 135–146)

## 2016-03-03 LAB — HEPATIC FUNCTION PANEL
ALBUMIN: 4.2 g/dL (ref 3.6–5.1)
ALT: 15 U/L (ref 9–46)
AST: 20 U/L (ref 10–35)
Alkaline Phosphatase: 70 U/L (ref 40–115)
Bilirubin, Direct: 0.1 mg/dL (ref <=0.2)
Indirect Bilirubin: 0.5 mg/dL (ref 0.2–1.2)
TOTAL PROTEIN: 7.4 g/dL (ref 6.1–8.1)
Total Bilirubin: 0.6 mg/dL (ref 0.2–1.2)

## 2016-03-03 LAB — LIPID PANEL
CHOLESTEROL: 199 mg/dL (ref 125–200)
HDL: 73 mg/dL (ref >=40)
LDL CALC: 109 mg/dL (ref <130)
Total CHOL/HDL Ratio: 2.7 Ratio (ref <=5.0)
Triglycerides: 84 mg/dL (ref <150)
VLDL: 17 mg/dL (ref <30)

## 2016-03-03 NOTE — Progress Notes (Signed)
Wound check appointment. Steri-strips removed. Wound without redness or edema. Incision edges approximated, wound well healed. Normal device function. Thresholds, sensing, and impedances consistent with implant measurements. Device programmed at 3.5V (RA and RV) with auto capture programmed on in LV for extra safety margin until 3 month visit. Patient BiVp 98%. Histogram distribution appropriate for patient and level of activity. No mode switches or high ventricular rates noted. Patient educated about wound care, arm mobility, lifting restrictions. ROV with WC on 05/19/16.

## 2016-03-05 ENCOUNTER — Inpatient Hospital Stay (HOSPITAL_COMMUNITY): Admission: RE | Admit: 2016-03-05 | Payer: Commercial Managed Care - HMO | Source: Ambulatory Visit

## 2016-03-05 ENCOUNTER — Telehealth (HOSPITAL_COMMUNITY): Payer: Self-pay

## 2016-03-05 NOTE — Telephone Encounter (Signed)
Encounter complete. 

## 2016-03-10 ENCOUNTER — Ambulatory Visit (HOSPITAL_COMMUNITY)
Admission: RE | Admit: 2016-03-10 | Discharge: 2016-03-10 | Disposition: A | Discharge status: Home / Self Care | Payer: Commercial Managed Care - HMO | Source: Ambulatory Visit | Attending: Cardiovascular Disease | Admitting: Cardiovascular Disease

## 2016-03-10 DIAGNOSIS — I25810 Atherosclerosis of coronary artery bypass graft(s) without angina pectoris: Secondary | ICD-10-CM | POA: Diagnosis present

## 2016-03-10 LAB — MYOCARDIAL PERFUSION IMAGING
CHL CUP NUCLEAR SRS: 7
CHL CUP NUCLEAR SSS: 10
CSEPPHR: 71 {beats}/min
LVDIAVOL: 192 mL (ref 62–150)
LVSYSVOL: 117 mL
Rest HR: 60 {beats}/min
SDS: 3
TID: 1.12

## 2016-03-10 MED ORDER — TECHNETIUM TC 99M TETROFOSMIN IV KIT
10.2000 | PACK | Freq: Once | INTRAVENOUS | Status: AC | PRN
  Administered 2016-03-10: 10.2 via INTRAVENOUS
  Filled 2016-03-10: qty 11

## 2016-03-10 MED ORDER — TECHNETIUM TC 99M TETROFOSMIN IV KIT
30.6000 | PACK | Freq: Once | INTRAVENOUS | Status: AC | PRN
  Administered 2016-03-10: 30.6 via INTRAVENOUS
  Filled 2016-03-10: qty 31

## 2016-03-10 MED ORDER — AMINOPHYLLINE 25 MG/ML IV SOLN
75.0000 mg | Freq: Once | INTRAVENOUS | Status: AC
  Administered 2016-03-10: 75 mg via INTRAVENOUS

## 2016-03-10 MED ORDER — REGADENOSON 0.4 MG/5ML IV SOLN
0.4000 mg | Freq: Once | INTRAVENOUS | Status: AC
  Administered 2016-03-10: 0.4 mg via INTRAVENOUS

## 2016-03-17 ENCOUNTER — Ambulatory Visit: Payer: Commercial Managed Care - HMO | Admitting: Cardiology

## 2016-04-09 ENCOUNTER — Encounter (HOSPITAL_BASED_OUTPATIENT_CLINIC_OR_DEPARTMENT_OTHER): Payer: Self-pay | Admitting: *Deleted

## 2016-04-09 ENCOUNTER — Emergency Department (HOSPITAL_BASED_OUTPATIENT_CLINIC_OR_DEPARTMENT_OTHER)
Admission: EM | Admit: 2016-04-09 | Discharge: 2016-04-09 | Disposition: A | Discharge status: Home / Self Care | Payer: Commercial Managed Care - HMO | Attending: Emergency Medicine | Admitting: Emergency Medicine

## 2016-04-09 ENCOUNTER — Emergency Department (HOSPITAL_BASED_OUTPATIENT_CLINIC_OR_DEPARTMENT_OTHER): Payer: Commercial Managed Care - HMO

## 2016-04-09 DIAGNOSIS — R740 Nonspecific elevation of levels of transaminase and lactic acid dehydrogenase [LDH]: Secondary | ICD-10-CM | POA: Diagnosis not present

## 2016-04-09 DIAGNOSIS — I951 Orthostatic hypotension: Secondary | ICD-10-CM

## 2016-04-09 DIAGNOSIS — Z7982 Long term (current) use of aspirin: Secondary | ICD-10-CM | POA: Diagnosis not present

## 2016-04-09 DIAGNOSIS — R7401 Elevation of levels of liver transaminase levels: Secondary | ICD-10-CM

## 2016-04-09 DIAGNOSIS — Z79899 Other long term (current) drug therapy: Secondary | ICD-10-CM | POA: Insufficient documentation

## 2016-04-09 DIAGNOSIS — Z951 Presence of aortocoronary bypass graft: Secondary | ICD-10-CM | POA: Insufficient documentation

## 2016-04-09 DIAGNOSIS — I10 Essential (primary) hypertension: Secondary | ICD-10-CM | POA: Insufficient documentation

## 2016-04-09 DIAGNOSIS — F17210 Nicotine dependence, cigarettes, uncomplicated: Secondary | ICD-10-CM | POA: Diagnosis not present

## 2016-04-09 DIAGNOSIS — R55 Syncope and collapse: Secondary | ICD-10-CM | POA: Diagnosis present

## 2016-04-09 LAB — CBC WITH DIFFERENTIAL/PLATELET
BASOS PCT: 0 %
Basophils Absolute: 0 10*3/uL (ref 0.0–0.1)
Eosinophils Absolute: 0.1 10*3/uL (ref 0.0–0.7)
Eosinophils Relative: 1 %
HEMATOCRIT: 39.4 % (ref 39.0–52.0)
Hemoglobin: 13.5 g/dL (ref 13.0–17.0)
Lymphocytes Relative: 29 %
Lymphs Abs: 1.4 10*3/uL (ref 0.7–4.0)
MCH: 29.2 pg (ref 26.0–34.0)
MCHC: 34.3 g/dL (ref 30.0–36.0)
MCV: 85.1 fL (ref 78.0–100.0)
MONO ABS: 0.4 10*3/uL (ref 0.1–1.0)
MONOS PCT: 9 %
NEUTROS ABS: 3 10*3/uL (ref 1.7–7.7)
Neutrophils Relative %: 61 %
Platelets: 174 10*3/uL (ref 150–400)
RBC: 4.63 MIL/uL (ref 4.22–5.81)
RDW: 13.9 % (ref 11.5–15.5)
WBC: 4.9 10*3/uL (ref 4.0–10.5)

## 2016-04-09 LAB — TROPONIN I
TROPONIN I: 0.03 ng/mL — AB (ref <0.03)
Troponin I: <0.03 ng/mL

## 2016-04-09 LAB — COMPREHENSIVE METABOLIC PANEL
ALBUMIN: 3.8 g/dL (ref 3.5–5.0)
ALT: 76 U/L — ABNORMAL HIGH (ref 17–63)
ANION GAP: 10 (ref 5–15)
AST: 102 U/L — ABNORMAL HIGH (ref 15–41)
Alkaline Phosphatase: 54 U/L (ref 38–126)
BILIRUBIN TOTAL: 0.7 mg/dL (ref 0.3–1.2)
BUN: 13 mg/dL (ref 6–20)
CO2: 25 mmol/L (ref 22–32)
Calcium: 9.2 mg/dL (ref 8.9–10.3)
Chloride: 99 mmol/L — ABNORMAL LOW (ref 101–111)
Creatinine, Ser: 0.91 mg/dL (ref 0.61–1.24)
GFR calc Af Amer: >60 mL/min (ref >60)
GLUCOSE: 110 mg/dL — AB (ref 65–99)
POTASSIUM: 4 mmol/L (ref 3.5–5.1)
Sodium: 134 mmol/L — ABNORMAL LOW (ref 135–145)
TOTAL PROTEIN: 7.7 g/dL (ref 6.5–8.1)

## 2016-04-09 MED ORDER — SODIUM CHLORIDE 0.9 % IV BOLUS (SEPSIS)
500.0000 mL | Freq: Once | INTRAVENOUS | Status: AC
  Administered 2016-04-09: 500 mL via INTRAVENOUS

## 2016-04-09 NOTE — Discharge Instructions (Signed)
Get help right away if: °You have chest pain. °You have a fast or irregular heartbeat. °You develop numbness in any part of your body. °You cannot move your arms or your legs. °You have trouble speaking. °You become sweaty or feel lightheaded. °You faint. °You feel short of breath. °You have trouble staying awake. °You feel confused. °

## 2016-04-09 NOTE — ED Provider Notes (Signed)
  Physical Exam  BP 123/79   Pulse 60   Temp 98 F (36.7 C)   Resp 16   Ht 6\' 1"  (1.854 m)   Wt 70.8 kg   SpO2 100%   BMI 20.58 kg/m  Patient taken in sign out from PA Kirichenko. 75 year old gentleman with a history of hypertension, CAD, ischemic cardiomyopathy, incomplete heart block with recent pacemaker defibrillator placement who had an episode of syncope today. Patient also drank last night and was dehydrated with positive orthostatic vital signs. Initial troponin elevated slightly. He will have a repeat troponin and is ambulatory with negative troponin. He may be discharged.   Agent. Repeat troponin negative. Patient ambulatory after fluids, given without dizziness.  Physical Exam Physical Exam  Constitutional: He appears well-developed and well-nourished. No distress.  HENT:  Head: Normocephalic and atraumatic.  Eyes: Conjunctivae are normal.  Neck: Neck supple.  Cardiovascular: Normal rate, regular rhythm and normal heart sounds.   Pulmonary/Chest: Effort normal. No respiratory distress. He has no wheezes. He has no rales.  Abdominal: Soft. Bowel sounds are normal. He exhibits no distension. There is no tenderness. There is no rebound.  Musculoskeletal: He exhibits no edema.  Neurological: He is alert.  Skin: Skin is warm and dry.  Nursing note and vitals reviewed. ED Course  Procedures  MDM Patient with orthostatic syncope. Given fluids here in the emergency department. He is advised to follow-up with his primary care physician. No signs of cardiac etiology. Pierce safe for discharge at this time. Discussed return precautions.       Arthor CaptainAbigail Dezzie Badilla, PA-C 04/12/16 1943    Melene Planan Floyd, DO 04/13/16 1524

## 2016-04-09 NOTE — ED Provider Notes (Signed)
[facility] Provider Note   CSN: [id number] Arrival date & time: 04/09/16  1411     History   Chief Complaint Chief Complaint  Patient presents with  . Loss of Consciousness    HPI Patrick Knox is a 75 y.o. male.  HPI  Patrick Knox is a 75 y.o. male with history of hypertension, coronary disease, ischemic cardiomyopathy, history of complete heart block with recent placement of pacemaker defibrillator just 2 months ago for syncope, presents to emergency department after an episode of syncope. Patient was with his family visiting his sister in law who is in hospice, states he was standing around instantly felt dizzy. His wife states that he leaned on the wall, and started slowly sinking down, when they caught him and lowered him to the ground. Patient quickly regained consciousness. Patient denies any complaints since then, this occurred at noon. Prior to the episode he states he did feel dizzy but denies any palpitations, any chest pain, shortness of breath. He denies any symptoms since the syncopal episode. He states that he had no appetite this morning and did not eat or drink anything yet. He does admit drinking alcohol last night, and states he normally does not drink but has been having a few drinks a night in the last several days. Patient denies any chest pain at this time, no palpitations no shortness of breath, no dizziness.   Past Medical History:  Diagnosis Date  . High cholesterol   . Hypertension     Patient Active Problem List   Diagnosis Date Noted  . Cardiomyopathy, ischemic   . Bradycardia 02/17/2016  . 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 Heart block   . SBO (small bowel obstruction) 12/20/2015  . Hypertension   . High cholesterol     Past Surgical History:  Procedure Laterality Date  . CORONARY ARTERY BYPASS GRAFT    . EP IMPLANTABLE DEVICE N/A 02/18/2016   Procedure: BiV Pacemaker Insertion CRT-P;  Surgeon: Duke SalviaSteven C Klein, MD;   [facility];  Service: Cardiovascular;  Laterality: N/A;  . HERNIA REPAIR         Home Medications    Prior to Admission medications   Medication Sig Start Date End Date Taking? Authorizing Provider  aspirin EC 81 MG tablet Take 81 mg by mouth daily.    Historical Provider, MD  carvedilol (COREG) 6.25 MG tablet Take 1 tablet (6.25 mg total) by mouth 2 (two) times daily with a meal. 02/19/16   Amber Caryl BisK Seiler, NP  hydrochlorothiazide (MICROZIDE) 12.5 MG capsule Take 12.5 mg by mouth every morning.  12/02/15   Historical Provider, MD  lisinopril (PRINIVIL,ZESTRIL) 2.5 MG tablet Take 1 tablet (2.5 mg total) by mouth daily. 02/25/16 05/25/16  Lewayne BuntingBrian S Crenshaw, MD  rosuvastatin (CRESTOR) 40 MG tablet Take 1 tablet (40 mg total) by mouth every morning. 02/25/16   Lewayne BuntingBrian S Crenshaw, MD    Family History History reviewed. No pertinent family history.  Social History Social History  Substance Use Topics  . Smoking status: Current Every Day Smoker    Packs/day: 0.50    Types: Cigarettes  . Smokeless tobacco: Never Used  . Alcohol use No     Allergies   Patient has no known allergies.   Review of Systems Review of Systems  Constitutional: Negative for chills and fever.  Respiratory: Negative for cough, chest tightness and shortness of breath.   Cardiovascular: Negative for chest pain, palpitations and leg swelling.  Gastrointestinal:  Negative for abdominal distention, abdominal pain, diarrhea, nausea and vomiting.  Genitourinary: Negative for dysuria, frequency, hematuria and urgency.  Musculoskeletal: Negative for arthralgias, myalgias, neck pain and neck stiffness.  Skin: Negative for rash.  Allergic/Immunologic: Negative for immunocompromised state.  Neurological: Positive for syncope. Negative for dizziness, weakness, light-headedness, numbness and headaches.  All other systems reviewed and are negative.    Physical Exam Updated Vital Signs BP 123/79   Pulse  60   Temp 98 F (36.7 C)   Resp 16   Ht 6\' 1"  (1.854 m)   Wt 70.8 kg   SpO2 100%   BMI 20.58 kg/m   Physical Exam  Constitutional: He appears well-developed and well-nourished. No distress.  HENT:  Head: Normocephalic and atraumatic.  Eyes: Conjunctivae are normal.  Neck: Neck supple.  Cardiovascular: Normal rate, regular rhythm and normal heart sounds.   Pulmonary/Chest: Effort normal. No respiratory distress. He has no wheezes. He has no rales.  Abdominal: Soft. Bowel sounds are normal. He exhibits no distension. There is no tenderness. There is no rebound.  Musculoskeletal: He exhibits no edema.  Neurological: He is alert.  Skin: Skin is warm and dry.  Nursing note and vitals reviewed.    ED Treatments / Results  Labs (all labs ordered are listed, but only abnormal results are displayed) Labs Reviewed  CBC WITH DIFFERENTIAL/PLATELET  COMPREHENSIVE METABOLIC PANEL  TROPONIN I  URINALYSIS, ROUTINE W REFLEX MICROSCOPIC    EKG  EKG Interpretation None       Radiology No results found.  Procedures Procedures (including critical care time)  Medications Ordered in ED Medications - No data to display   Initial Impression / Assessment and Plan / ED Course  I have reviewed the triage vital signs and the nursing notes.  Pertinent labs & imaging results that were available during my care of the patient were reviewed by me and considered in my medical decision making (see chart for details).  Clinical Course    Patient emergency department after syncopal episode. Will check orthostatics, labs, EKG. Patient recently admitted for the same, was found to be in complete heart block with bradycardia and had a pacemaker defibrillator placed. Patient has been feeling well since then. Patient also states he has not eaten or drank anything since this morning, it could be dehydrated. Admits to alcohol use last night. He is not a regular drinker..   Patient's EKG is paced.  His pacemaker interrogated and shows no evidence. His troponin is borderline elevated at 0.03. His LFTs are elevated, they were normal just one month ago. Suspect could be related to his increased alcohol intake recently. Glucose rechecked by his doctor. At this time he has no abdominal pain or tenderness on exam. No nausea or vomiting. We'll give him some food, and call cardiology.   4:07 PM Discussed with cardiology, advised to delta troponin is the same or lower, discharge home with close outpatient follow-up. If troponins trending up, patient will need admission. Discussed plan with family who agree.   Will repeat trop at 5:45 Pt signed out to PA Harris at shift change pending IV fluids, ambulation, recheck trop    Final Clinical Impressions(s) / ED Diagnoses   Final diagnoses:  None    New Prescriptions New Prescriptions   No medications on file     Jaynie Crumbleatyana Sherhonda Gaspar, PA-C 04/10/16 [id number]    Geoffery Lyonsouglas Delo, MD 04/10/16 1345

## 2016-04-09 NOTE — ED Triage Notes (Signed)
pt c/o sudden onset off dizziness with syncopal episode x 2 hrs ago

## 2016-04-09 NOTE — ED Notes (Signed)
Pt transported back from xray 

## 2016-05-19 ENCOUNTER — Encounter: Payer: Commercial Managed Care - HMO | Admitting: Cardiology

## 2016-06-08 ENCOUNTER — Ambulatory Visit (INDEPENDENT_AMBULATORY_CARE_PROVIDER_SITE_OTHER): Payer: Medicare PPO | Admitting: Cardiology

## 2016-06-08 ENCOUNTER — Encounter: Payer: Self-pay | Admitting: Cardiology

## 2016-06-08 VITALS — BP 134/80 | HR 60 | Ht 73.0 in | Wt 159.6 lb

## 2016-06-08 DIAGNOSIS — I442 Atrioventricular block, complete: Secondary | ICD-10-CM | POA: Diagnosis not present

## 2016-06-08 DIAGNOSIS — Z950 Presence of cardiac pacemaker: Secondary | ICD-10-CM

## 2016-06-08 DIAGNOSIS — I255 Ischemic cardiomyopathy: Secondary | ICD-10-CM

## 2016-06-08 DIAGNOSIS — I480 Paroxysmal atrial fibrillation: Secondary | ICD-10-CM | POA: Diagnosis not present

## 2016-06-08 LAB — CUP PACEART INCLINIC DEVICE CHECK
Battery Voltage: 2.98 V
Brady Statistic RV Percent Paced: 96 %
Implantable Lead Implant Date: 20171018
Implantable Lead Implant Date: 20171018
Implantable Lead Implant Date: 20171018
Implantable Lead Location: 753858
Implantable Lead Location: 753860
Lead Channel Impedance Value: 375 Ohm
Lead Channel Impedance Value: 600 Ohm
Lead Channel Impedance Value: 775 Ohm
Lead Channel Pacing Threshold Amplitude: 0.625 V
Lead Channel Pacing Threshold Amplitude: 0.75 V
Lead Channel Pacing Threshold Pulse Width: 0.4 ms
Lead Channel Pacing Threshold Pulse Width: 0.4 ms
Lead Channel Pacing Threshold Pulse Width: 0.8 ms
Lead Channel Sensing Intrinsic Amplitude: 2.1 mV
Lead Channel Setting Pacing Amplitude: 2 V
Lead Channel Setting Pacing Pulse Width: 0.8 ms
Lead Channel Setting Sensing Sensitivity: 5 mV
MDC IDC LEAD LOCATION: 753859
MDC IDC MSMT LEADCHNL LV PACING THRESHOLD AMPLITUDE: 1 V
MDC IDC MSMT LEADCHNL LV PACING THRESHOLD AMPLITUDE: 1 V
MDC IDC MSMT LEADCHNL LV PACING THRESHOLD PULSEWIDTH: 0.8 ms
MDC IDC MSMT LEADCHNL RA PACING THRESHOLD AMPLITUDE: 0.5 V
MDC IDC MSMT LEADCHNL RA PACING THRESHOLD AMPLITUDE: 0.5 V
MDC IDC MSMT LEADCHNL RA PACING THRESHOLD PULSEWIDTH: 0.4 ms
MDC IDC MSMT LEADCHNL RV PACING THRESHOLD PULSEWIDTH: 0.4 ms
MDC IDC PG IMPLANT DT: 20171018
MDC IDC PG SERIAL: 7936214
MDC IDC SESS DTM: 20180206085816
MDC IDC SET LEADCHNL RA PACING AMPLITUDE: 2 V
MDC IDC SET LEADCHNL RV PACING AMPLITUDE: 2 V
MDC IDC SET LEADCHNL RV PACING PULSEWIDTH: 0.4 ms
MDC IDC STAT BRADY RA PERCENT PACED: 60 %

## 2016-06-08 MED ORDER — APIXABAN 5 MG PO TABS: 5.0000 mg | ORAL_TABLET | Freq: Two times a day (BID) | ORAL | 3 refills | Status: DC

## 2016-06-08 MED ORDER — LISINOPRIL 5 MG PO TABS
5.0000 mg | ORAL_TABLET | Freq: Every day | ORAL | 3 refills | Status: DC
Start: 2016-06-08 — End: 2017-01-22

## 2016-06-08 MED ORDER — APIXABAN 5 MG PO TABS: 5.0000 mg | ORAL_TABLET | Freq: Two times a day (BID) | ORAL | 0 refills | Status: DC

## 2016-06-08 MED ORDER — CARVEDILOL 12.5 MG PO TABS
12.5000 mg | ORAL_TABLET | Freq: Two times a day (BID) | ORAL | 3 refills | Status: DC
Start: 2016-06-08 — End: 2016-12-23

## 2016-06-08 NOTE — Progress Notes (Signed)
Electrophysiology Office Note   Date:  06/08/2016   ID:  Patrick Knox, DOB 05/03/1941, MRN [id number]  PCP:  Lillia Mountain, MD  Cardiologist:  Jens Som Primary Electrophysiologist:  Shriyan Arakawa Jorja Loa, MD    Chief Complaint  Patient presents with  . Follow-up    no chest pain no SOB no light headedness or dizziness no edema      History of Present Illness: Patrick Knox is a 76 y.o. male who presents today for electrophysiology evaluation.   He has a history of coronary disease status post CABG. He was lost to cardiology follow-up, but presented to the hospital with dizziness and was found to be in complete heart block in October 2017. He had an echocardiogram that showed LV dysfunction with an EF of 35-40%. Due to his heart block, he had CRT P implantation. A Myoview to evaluate his low EF showed evidence of infarction without ischemia.   Today, he denies symptoms of palpitations, chest pain, shortness of breath, orthopnea, PND, lower extremity edema, claudication, dizziness, presyncope, syncope, bleeding, or neurologic sequela. The patient is tolerating medications without difficulties and is otherwise without complaint today.  Interrogation of his pacemaker showed up to 2-1/2 hours of atrial fibrillation. He was not aware of palpitations.   Past Medical History:  Diagnosis Date  . High cholesterol   . Hypertension    Past Surgical History:  Procedure Laterality Date  . CORONARY ARTERY BYPASS GRAFT    . EP IMPLANTABLE DEVICE N/A 02/18/2016   Procedure: BiV Pacemaker Insertion CRT-P;  Surgeon: Duke Salvia, MD;  Location: [facility] INVASIVE CV LAB;  Service: Cardiovascular;  Laterality: N/A;  . HERNIA REPAIR       Current Outpatient Prescriptions  Medication Sig Dispense Refill  . aspirin EC 81 MG tablet Take 81 mg by mouth daily.    . rosuvastatin (CRESTOR) 40 MG tablet Take 1 tablet (40 mg total) by mouth every morning. 90 tablet 3   No current facility-administered  medications for this visit.     Allergies:   Patient has no known allergies.   Social History:  The patient  reports that he has been smoking Cigarettes.  He has been smoking about 0.50 packs per day. He has never used smokeless tobacco. He reports that he does not drink alcohol or use drugs.   Family History:  The patient's family history includes Breast cancer in his sister; Hypertension in his father and mother.    ROS:  Please see the history of present illness.   Otherwise, review of systems is positive for none.   All other systems are reviewed and negative.    PHYSICAL EXAM: VS:  BP 134/80   Pulse 60   Ht 6\' 1"  (1.854 m)   Wt 159 lb 9.6 oz (72.4 kg)   BMI 21.06 kg/m  , BMI Body mass index is 21.06 kg/m. GEN: Well nourished, well developed, in no acute distress  HEENT: normal  Neck: no JVD, carotid bruits, or masses Cardiac: RRR; no murmurs, rubs, or gallops,no edema  Respiratory:  clear to auscultation bilaterally, normal work of breathing GI: soft, nontender, nondistended, + BS MS: no deformity or atrophy  Skin: warm and dry,  device pocket is well healed Neuro:  Strength and sensation are intact Psych: euthymic mood, full affect  EKG:  EKG is ordered today. Personal review of the ekg ordered shows dual AV paced   Device interrogation is reviewed today in detail.  See PaceArt for details.  Recent Labs: 02/17/2016: B Natriuretic Peptide 68.0; Magnesium 1.8 04/09/2016: ALT 76; BUN 13; Creatinine, Ser 0.91; Hemoglobin 13.5; Platelets 174; Potassium 4.0; Sodium 134    Lipid Panel     Component Value Date/Time   CHOL 199 03/02/2016 0851   TRIG 84 03/02/2016 0851   HDL 73 03/02/2016 0851   CHOLHDL 2.7 03/02/2016 0851   VLDL 17 03/02/2016 0851   LDLCALC 109 03/02/2016 0851     Wt Readings from Last 3 Encounters:  06/08/16 159 lb 9.6 oz (72.4 kg)  04/09/16 156 lb (70.8 kg)  03/10/16 155 lb (70.3 kg)      Other studies Reviewed: Additional studies/  records that were reviewed today include: TTE 02/18/16  Review of the above records today demonstrates:  - Left ventricle: Mid/Basal inferior wall, septal and apical   akinesis The cavity size was moderately dilated. Wall thickness   was normal. Systolic function was moderately reduced. The   estimated ejection fraction was in the range of 35% to 40%. - Left atrium: The atrium was mildly dilated. - Atrial septum: No defect or patent foramen ovale was identified. - Impressions: Patient appears to be in complete heart block on   montior  Myoview 03/10/16  The left ventricular ejection fraction is moderately decreased (30-44%).  Nuclear stress EF: 39%.  Defect 1: There is a medium defect of moderate severity present in the basal inferoseptal, basal inferior, mid inferoseptal and mid inferior location.  Defect 2: There is a small defect of moderate severity present in the apex location.  Findings consistent with prior myocardial infarction. No ischemia.  This is an intermediate risk study.   ASSESSMENT AND PLAN:  1.  Complete AV block: Status post St. Jude CRT P. Device functioning appropriately. Changes were made for long-term management. Auto capture the RV and LV were turned on.  2. Coronary artery disease status post CABG: Currently without chest pain. Continue current management.  3. Ischemic cardiomyopathy: Has had a Myoview in the past that showed infarction with no ischemia. Is currently on carvedilol. We'll plan to increase coreg and lisinopril today.  4. Paroxysmal atrial fibrillation: Incidentally found on his device interrogation. He does have a high stroke risk. Today we are increasing his carvedilol, and we'll start him on Eliquis. I have told him that if he is having trouble affording the Eliquis that we would switch him to Coumadin.  This patients CHA2DS2-VASc Score and unadjusted Ischemic Stroke Rate (% per year) is equal to 4.8 % stroke rate/year from a score of  4  Above score calculated as 1 point each if present [CHF, HTN, DM, Vascular=MI/PAD/Aortic Plaque, Age if 65-74, or Male] Above score calculated as 2 points each if present [Age > 75, or Stroke/TIA/TE]   Current medicines are reviewed at length with the patient today.   The patient does not have concerns regarding his medicines.  The following changes were made today:  none  Labs/ tests ordered today include:  No orders of the defined types were placed in this encounter.    Disposition:   FU with Hania Cerone 9 months  Signed, Hasel Janish Jorja LoaMartin Sylar Voong, MD  06/08/2016 8:43 AM     [facility] [address] [place] [address] [phone] (office) [phone] (fax)

## 2016-06-08 NOTE — Patient Instructions (Addendum)
Medication Instructions:    Your physician has recommended you make the following change in your medication: 1) INCREASE Carvedilol to 12.5 mg twice daily 2) INCREASE Lisinopril to 5 mg once daily 3) STOP Hydrochlorothiazide 3) START Eliquis 5 mg twice daily  --- If you need a refill on your cardiac medications before your next appointment, please call your pharmacy. ---  Labwork:  None ordered  Testing/Procedures:  None ordered  Follow-Up: Remote monitoring is used to monitor your Pacemaker of ICD from home. This monitoring reduces the number of office visits required to check your device to one time per year. It allows [place] to keep an eye on the functioning of your device to ensure it is working properly. You are scheduled for a device check from home on 09/07/2016. You may send your transmission at any time that day. If you have a wireless device, the transmission will be sent automatically. After your physician reviews your transmission, you will receive a postcard with your next transmission date.   Your physician wants you to follow-up in: 9 months with Dr. Elberta Fortis.  You will receive a reminder letter in the mail two months in advance. If you don't receive a letter, please call our office to schedule the follow-up appointment.  Thank you for choosing [facility]!!   Dory Horn, RN [phone]  Any Other Special Instructions Will Be Listed Below (If Applicable).  Apixaban oral tablets What is this medicine? APIXABAN (a PIX a ban) is an anticoagulant (blood thinner). It is used to lower the chance of stroke in people with a medical condition called atrial fibrillation. It is also used to treat or prevent blood clots in the lungs or in the veins. COMMON BRAND NAME(S): Eliquis What should I tell my health care provider before I take this medicine? They need to know if you have any of these conditions: -bleeding disorders -bleeding in the brain -blood in your stools  (black or tarry stools) or if you have blood in your vomit -history of stomach bleeding -kidney disease -liver disease -mechanical heart valve -an unusual or allergic reaction to apixaban, other medicines, foods, dyes, or preservatives -pregnant or trying to get pregnant -breast-feeding How should I use this medicine? Take this medicine by mouth with a glass of water. Follow the directions on the prescription label. You can take it with or without food. If it upsets your stomach, take it with food. Take your medicine at regular intervals. Do not take it more often than directed. Do not stop taking except on your doctor's advice. Stopping this medicine may increase your risk of a blot clot. Be sure to refill your prescription before you run out of medicine. Talk to your pediatrician regarding the use of this medicine in children. Special care may be needed. What if I miss a dose? If you miss a dose, take it as soon as you can. If it is almost time for your next dose, take only that dose. Do not take double or extra doses. What may interact with this medicine? This medicine may interact with the following: -aspirin and aspirin-like medicines -certain medicines for fungal infections like ketoconazole and itraconazole -certain medicines for seizures like carbamazepine and phenytoin -certain medicines that treat or prevent blood clots like warfarin, enoxaparin, and dalteparin -clarithromycin -NSAIDs, medicines for pain and inflammation, like ibuprofen or naproxen -rifampin -ritonavir -St. John's wort What should I watch for while using this medicine? Notify your doctor or health care professional and seek emergency treatment  if you develop breathing problems; changes in vision; chest pain; severe, sudden headache; pain, swelling, warmth in the leg; trouble speaking; sudden numbness or weakness of the face, arm, or leg. These can be signs that your condition has gotten worse. If you are going to  have surgery, tell your doctor or health care professional that you are taking this medicine. Tell your health care professional that you use this medicine before you have a spinal or epidural procedure. Sometimes people who take this medicine have bleeding problems around the spine when they have a spinal or epidural procedure. This bleeding is very rare. If you have a spinal or epidural procedure while on this medicine, call your health care professional immediately if you have back pain, numbness or tingling (especially in your legs and feet), muscle weakness, paralysis, or loss of bladder or bowel control. Avoid sports and activities that might cause injury while you are using this medicine. Severe falls or injuries can cause unseen bleeding. Be careful when using sharp tools or knives. Consider using an Neurosurgeonelectric razor. Take special care brushing or flossing your teeth. Report any injuries, bruising, or red spots on the skin to your doctor or health care professional. What side effects may I notice from receiving this medicine? Side effects that you should report to your doctor or health care professional as soon as possible: -allergic reactions like skin rash, itching or hives, swelling of the face, lips, or tongue -signs and symptoms of bleeding such as bloody or black, tarry stools; red or dark-brown urine; spitting up blood or brown material that looks like coffee grounds; red spots on the skin; unusual bruising or bleeding from the eye, gums, or nose Where should I keep my medicine? Keep out of the reach of children. Store at room temperature between 20 and 25 degrees C (68 and 77 degrees F). Throw away any unused medicine after the expiration date.  2017 Elsevier/Gold Standard (2015-05-22 09:26:49)

## 2016-06-14 ENCOUNTER — Other Ambulatory Visit: Payer: Self-pay | Admitting: Nurse Practitioner

## 2016-06-15 ENCOUNTER — Other Ambulatory Visit: Payer: Self-pay | Admitting: Cardiology

## 2016-06-15 ENCOUNTER — Other Ambulatory Visit: Payer: Self-pay | Admitting: Nurse Practitioner

## 2016-06-15 ENCOUNTER — Telehealth: Payer: Self-pay | Admitting: Cardiology

## 2016-06-15 ENCOUNTER — Telehealth: Payer: Self-pay | Admitting: Pharmacist

## 2016-06-15 DIAGNOSIS — I480 Paroxysmal atrial fibrillation: Secondary | ICD-10-CM

## 2016-06-15 MED ORDER — WARFARIN SODIUM 5 MG PO TABS: 5.0000 mg | ORAL_TABLET | Freq: Every day | ORAL | 0 refills | Status: DC

## 2016-06-15 NOTE — Telephone Encounter (Signed)
Pt called to report that Eliquis is too expensive. Advised that we can switch to Xarelto or warfarin, but that Xarelto likely would be similar cost wise.   We discussed warfarin and he does not want to pursue warfarin. He will call if Xarelto will be cheaper copay for him.

## 2016-06-15 NOTE — Telephone Encounter (Signed)
Spoke to patient about transition. He will take both Eliquis and warfarin starting tomorrow for 3 days then INR check scheduled for Monday 2/19.

## 2016-06-15 NOTE — Telephone Encounter (Signed)
Start warfarin 5mg  daily tomorrow. Rx sent to pharmacy of choice. Pt states understanding of instructions and appreciation for help.

## 2016-06-15 NOTE — Telephone Encounter (Signed)
Pt spoke with Prudence DavidsonKelley Auten, RPH earlier.  Pt states that he spoke with his insurance company and they told him Xarelto would be about the same price as the Eliquis.  Pt willing to switch to Warfarin.  Advised pt I will send this message to [facility] and have them contact him to get him set up with Coumadin Clinic.  Pt appreciative for assistance.

## 2016-06-15 NOTE — Telephone Encounter (Signed)
Spoke to wife, pt is out of house. Left phone number to call back to coordinate transition to warfarin from Eliquis.  Will plan 3 day overlap and check INR in 5-7days.

## 2016-06-15 NOTE — Telephone Encounter (Signed)
Mr.Holsonback is wanting to speak with someone about his medication (Eliquis) states its to expensive and unable to afford that . Want to know if there is something different he can take. Please call

## 2016-06-16 NOTE — Telephone Encounter (Signed)
Medication Detail    Disp Refills Start End   carvedilol (COREG) 12.5 MG tablet 180 tablet 3 06/08/2016 09/06/2016   Sig - Route: Take 1 tablet (12.5 mg total) by mouth 2 (two) times daily. - Oral   Notes to Pharmacy: Increased dosage   E-Prescribing Status: Receipt confirmed by pharmacy (06/08/2016 8:48 AM EST)   Associated Diagnoses   Ischemic cardiomyopathy     Pharmacy   [facility] [place] - [phone]

## 2016-06-21 ENCOUNTER — Ambulatory Visit (INDEPENDENT_AMBULATORY_CARE_PROVIDER_SITE_OTHER): Payer: Medicare PPO | Admitting: *Deleted

## 2016-06-21 DIAGNOSIS — I480 Paroxysmal atrial fibrillation: Secondary | ICD-10-CM

## 2016-06-21 DIAGNOSIS — Z5181 Encounter for therapeutic drug level monitoring: Secondary | ICD-10-CM | POA: Diagnosis not present

## 2016-06-21 NOTE — Patient Instructions (Signed)

## 2016-06-22 ENCOUNTER — Other Ambulatory Visit: Payer: Self-pay | Admitting: Cardiology

## 2016-06-28 ENCOUNTER — Ambulatory Visit (INDEPENDENT_AMBULATORY_CARE_PROVIDER_SITE_OTHER): Payer: Medicare PPO | Admitting: *Deleted

## 2016-06-28 DIAGNOSIS — Z5181 Encounter for therapeutic drug level monitoring: Secondary | ICD-10-CM

## 2016-06-28 DIAGNOSIS — I480 Paroxysmal atrial fibrillation: Secondary | ICD-10-CM | POA: Diagnosis not present

## 2016-06-28 LAB — POCT INR: INR: 1.2

## 2016-07-05 ENCOUNTER — Ambulatory Visit (INDEPENDENT_AMBULATORY_CARE_PROVIDER_SITE_OTHER): Payer: Medicare PPO | Admitting: *Deleted

## 2016-07-05 DIAGNOSIS — Z5181 Encounter for therapeutic drug level monitoring: Secondary | ICD-10-CM

## 2016-07-05 DIAGNOSIS — I480 Paroxysmal atrial fibrillation: Secondary | ICD-10-CM | POA: Diagnosis not present

## 2016-07-05 LAB — POCT INR: INR: 3.6

## 2016-07-12 ENCOUNTER — Ambulatory Visit (INDEPENDENT_AMBULATORY_CARE_PROVIDER_SITE_OTHER): Payer: Medicare PPO | Admitting: *Deleted

## 2016-07-12 DIAGNOSIS — I480 Paroxysmal atrial fibrillation: Secondary | ICD-10-CM | POA: Diagnosis not present

## 2016-07-12 DIAGNOSIS — Z5181 Encounter for therapeutic drug level monitoring: Secondary | ICD-10-CM | POA: Diagnosis not present

## 2016-07-12 LAB — POCT INR: INR: 2.5

## 2016-07-19 ENCOUNTER — Ambulatory Visit (INDEPENDENT_AMBULATORY_CARE_PROVIDER_SITE_OTHER): Payer: Medicare PPO | Admitting: *Deleted

## 2016-07-19 DIAGNOSIS — I480 Paroxysmal atrial fibrillation: Secondary | ICD-10-CM

## 2016-07-19 DIAGNOSIS — Z5181 Encounter for therapeutic drug level monitoring: Secondary | ICD-10-CM | POA: Diagnosis not present

## 2016-07-19 LAB — POCT INR: INR: 3.2

## 2016-07-20 ENCOUNTER — Other Ambulatory Visit: Payer: Self-pay | Admitting: Cardiology

## 2016-07-20 MED ORDER — WARFARIN SODIUM 5 MG PO TABS: 5.0000 mg | ORAL_TABLET | Freq: Every day | ORAL | 3 refills | Status: DC

## 2016-07-26 ENCOUNTER — Ambulatory Visit (INDEPENDENT_AMBULATORY_CARE_PROVIDER_SITE_OTHER): Payer: Medicare PPO | Admitting: *Deleted

## 2016-07-26 DIAGNOSIS — Z5181 Encounter for therapeutic drug level monitoring: Secondary | ICD-10-CM | POA: Diagnosis not present

## 2016-07-26 DIAGNOSIS — I480 Paroxysmal atrial fibrillation: Secondary | ICD-10-CM

## 2016-07-26 LAB — POCT INR: INR: 3.8

## 2016-08-02 ENCOUNTER — Ambulatory Visit (INDEPENDENT_AMBULATORY_CARE_PROVIDER_SITE_OTHER): Payer: Medicare PPO

## 2016-08-02 DIAGNOSIS — I480 Paroxysmal atrial fibrillation: Secondary | ICD-10-CM

## 2016-08-02 DIAGNOSIS — Z5181 Encounter for therapeutic drug level monitoring: Secondary | ICD-10-CM | POA: Diagnosis not present

## 2016-08-02 LAB — POCT INR: INR: 2.4

## 2016-08-12 ENCOUNTER — Ambulatory Visit (INDEPENDENT_AMBULATORY_CARE_PROVIDER_SITE_OTHER): Payer: Medicare PPO | Admitting: Pharmacist

## 2016-08-12 DIAGNOSIS — I480 Paroxysmal atrial fibrillation: Secondary | ICD-10-CM | POA: Diagnosis not present

## 2016-08-12 DIAGNOSIS — Z5181 Encounter for therapeutic drug level monitoring: Secondary | ICD-10-CM

## 2016-08-12 LAB — POCT INR: INR: 2.3

## 2016-08-26 ENCOUNTER — Ambulatory Visit (INDEPENDENT_AMBULATORY_CARE_PROVIDER_SITE_OTHER): Payer: Medicare PPO | Admitting: *Deleted

## 2016-08-26 DIAGNOSIS — I480 Paroxysmal atrial fibrillation: Secondary | ICD-10-CM | POA: Diagnosis not present

## 2016-08-26 DIAGNOSIS — Z5181 Encounter for therapeutic drug level monitoring: Secondary | ICD-10-CM | POA: Diagnosis not present

## 2016-08-26 LAB — POCT INR: INR: 3.3

## 2016-09-07 ENCOUNTER — Ambulatory Visit (INDEPENDENT_AMBULATORY_CARE_PROVIDER_SITE_OTHER): Payer: Medicare PPO | Admitting: *Deleted

## 2016-09-07 ENCOUNTER — Telehealth: Payer: Self-pay | Admitting: Cardiology

## 2016-09-07 DIAGNOSIS — I442 Atrioventricular block, complete: Secondary | ICD-10-CM | POA: Diagnosis not present

## 2016-09-07 NOTE — Telephone Encounter (Signed)
Spoke with pt and reminded pt of remote transmission that is due today. Pt verbalized understanding.   

## 2016-09-07 NOTE — Progress Notes (Signed)
Remote pacemaker transmission.   

## 2016-09-09 ENCOUNTER — Ambulatory Visit (INDEPENDENT_AMBULATORY_CARE_PROVIDER_SITE_OTHER): Payer: Medicare PPO | Admitting: *Deleted

## 2016-09-09 DIAGNOSIS — Z5181 Encounter for therapeutic drug level monitoring: Secondary | ICD-10-CM

## 2016-09-09 DIAGNOSIS — I480 Paroxysmal atrial fibrillation: Secondary | ICD-10-CM

## 2016-09-09 LAB — CUP PACEART REMOTE DEVICE CHECK
Battery Remaining Longevity: 87 mo
Battery Remaining Percentage: 95.5 %
Brady Statistic AP VS Percent: 1 %
Brady Statistic AS VS Percent: 1 %
Date Time Interrogation Session: 20180508181130
Implantable Lead Implant Date: 20171018
Implantable Lead Location: 753858
Implantable Lead Location: 753859
Implantable Lead Location: 753860
Implantable Pulse Generator Implant Date: 20171018
Lead Channel Impedance Value: 380 Ohm
Lead Channel Impedance Value: 740 Ohm
Lead Channel Pacing Threshold Amplitude: 0.75 V
Lead Channel Pacing Threshold Amplitude: 0.875 V
Lead Channel Pacing Threshold Pulse Width: 0.8 ms
Lead Channel Sensing Intrinsic Amplitude: 1.9 mV
Lead Channel Sensing Intrinsic Amplitude: 12 mV
Lead Channel Setting Pacing Amplitude: 2 V
Lead Channel Setting Pacing Amplitude: 2 V
Lead Channel Setting Pacing Pulse Width: 0.4 ms
Lead Channel Setting Pacing Pulse Width: 0.8 ms
Lead Channel Setting Sensing Sensitivity: 5 mV
MDC IDC LEAD IMPLANT DT: 20171018
MDC IDC LEAD IMPLANT DT: 20171018
MDC IDC MSMT BATTERY VOLTAGE: 2.98 V
MDC IDC MSMT LEADCHNL RA PACING THRESHOLD AMPLITUDE: 0.75 V
MDC IDC MSMT LEADCHNL RA PACING THRESHOLD PULSEWIDTH: 0.4 ms
MDC IDC MSMT LEADCHNL RV IMPEDANCE VALUE: 560 Ohm
MDC IDC MSMT LEADCHNL RV PACING THRESHOLD PULSEWIDTH: 0.4 ms
MDC IDC PG SERIAL: 7936214
MDC IDC SET LEADCHNL LV PACING AMPLITUDE: 2 V
MDC IDC STAT BRADY AP VP PERCENT: 73 %
MDC IDC STAT BRADY AS VP PERCENT: 23 %
MDC IDC STAT BRADY RA PERCENT PACED: 68 %

## 2016-09-09 LAB — POCT INR: INR: 1.9

## 2016-09-23 ENCOUNTER — Ambulatory Visit (INDEPENDENT_AMBULATORY_CARE_PROVIDER_SITE_OTHER): Payer: Medicare PPO | Admitting: *Deleted

## 2016-09-23 DIAGNOSIS — I480 Paroxysmal atrial fibrillation: Secondary | ICD-10-CM | POA: Diagnosis not present

## 2016-09-23 DIAGNOSIS — Z5181 Encounter for therapeutic drug level monitoring: Secondary | ICD-10-CM

## 2016-09-23 LAB — POCT INR: INR: 2.9

## 2016-10-14 ENCOUNTER — Ambulatory Visit (INDEPENDENT_AMBULATORY_CARE_PROVIDER_SITE_OTHER): Payer: Medicare PPO | Admitting: Pharmacist

## 2016-10-14 DIAGNOSIS — Z5181 Encounter for therapeutic drug level monitoring: Secondary | ICD-10-CM

## 2016-10-14 DIAGNOSIS — I480 Paroxysmal atrial fibrillation: Secondary | ICD-10-CM | POA: Diagnosis not present

## 2016-10-14 LAB — POCT INR: INR: 2.7

## 2016-11-11 ENCOUNTER — Ambulatory Visit (INDEPENDENT_AMBULATORY_CARE_PROVIDER_SITE_OTHER): Payer: Medicare PPO | Admitting: Pharmacist

## 2016-11-11 DIAGNOSIS — Z5181 Encounter for therapeutic drug level monitoring: Secondary | ICD-10-CM

## 2016-11-11 DIAGNOSIS — I480 Paroxysmal atrial fibrillation: Secondary | ICD-10-CM | POA: Diagnosis not present

## 2016-11-11 LAB — POCT INR: INR: 2.3

## 2016-12-06 ENCOUNTER — Other Ambulatory Visit: Payer: Self-pay | Admitting: Cardiology

## 2016-12-07 ENCOUNTER — Ambulatory Visit (INDEPENDENT_AMBULATORY_CARE_PROVIDER_SITE_OTHER): Payer: Medicare PPO | Admitting: *Deleted

## 2016-12-07 DIAGNOSIS — I442 Atrioventricular block, complete: Secondary | ICD-10-CM | POA: Diagnosis not present

## 2016-12-07 NOTE — Progress Notes (Signed)
Remote pacemaker transmission.   

## 2016-12-08 ENCOUNTER — Encounter: Payer: Self-pay | Admitting: Cardiology

## 2016-12-09 ENCOUNTER — Ambulatory Visit (INDEPENDENT_AMBULATORY_CARE_PROVIDER_SITE_OTHER): Payer: Medicare PPO | Admitting: *Deleted

## 2016-12-09 DIAGNOSIS — Z5181 Encounter for therapeutic drug level monitoring: Secondary | ICD-10-CM | POA: Diagnosis not present

## 2016-12-09 DIAGNOSIS — I480 Paroxysmal atrial fibrillation: Secondary | ICD-10-CM | POA: Diagnosis not present

## 2016-12-09 LAB — POCT INR: INR: 2.9

## 2016-12-14 LAB — CUP PACEART REMOTE DEVICE CHECK
Battery Remaining Longevity: 94 mo
Battery Voltage: 2.98 V
Brady Statistic AP VS Percent: 1 %
Date Time Interrogation Session: 20180807105208
Implantable Lead Location: 753859
Implantable Lead Location: 753860
Implantable Pulse Generator Implant Date: 20171018
Lead Channel Impedance Value: 390 Ohm
Lead Channel Pacing Threshold Amplitude: 0.75 V
Lead Channel Pacing Threshold Amplitude: 0.75 V
Lead Channel Pacing Threshold Pulse Width: 0.4 ms
Lead Channel Pacing Threshold Pulse Width: 0.8 ms
Lead Channel Sensing Intrinsic Amplitude: 2.8 mV
Lead Channel Setting Pacing Amplitude: 2 V
Lead Channel Setting Pacing Amplitude: 2 V
Lead Channel Setting Pacing Pulse Width: 0.4 ms
Lead Channel Setting Pacing Pulse Width: 0.8 ms
MDC IDC LEAD IMPLANT DT: 20171018
MDC IDC LEAD IMPLANT DT: 20171018
MDC IDC LEAD IMPLANT DT: 20171018
MDC IDC LEAD LOCATION: 753858
MDC IDC MSMT BATTERY REMAINING PERCENTAGE: 95.5 %
MDC IDC MSMT LEADCHNL LV IMPEDANCE VALUE: 810 Ohm
MDC IDC MSMT LEADCHNL LV PACING THRESHOLD AMPLITUDE: 0.875 V
MDC IDC MSMT LEADCHNL RA PACING THRESHOLD PULSEWIDTH: 0.4 ms
MDC IDC MSMT LEADCHNL RV IMPEDANCE VALUE: 530 Ohm
MDC IDC MSMT LEADCHNL RV SENSING INTR AMPL: 12 mV
MDC IDC SET LEADCHNL LV PACING AMPLITUDE: 2 V
MDC IDC SET LEADCHNL RV SENSING SENSITIVITY: 5 mV
MDC IDC STAT BRADY AP VP PERCENT: 78 %
MDC IDC STAT BRADY AS VP PERCENT: 18 %
MDC IDC STAT BRADY AS VS PERCENT: 1 %
MDC IDC STAT BRADY RA PERCENT PACED: 72 %
Pulse Gen Model: 3262
Pulse Gen Serial Number: 7936214

## 2016-12-23 ENCOUNTER — Telehealth: Payer: Self-pay | Admitting: *Deleted

## 2016-12-23 MED ORDER — CARVEDILOL 25 MG PO TABS: 25.0000 mg | ORAL_TABLET | Freq: Two times a day (BID) | ORAL | 3 refills | Status: DC

## 2016-12-23 NOTE — Telephone Encounter (Signed)
-----   Message from Will Jorja Loa, MD sent at 12/19/2016  7:52 PM EDT ----- Abnormal LINQ reviewed. Notable for AF with elevated rates. Increase coreg to 25 mg.

## 2016-12-23 NOTE — Telephone Encounter (Signed)
Called patient back regarding medication. Informed patient that Dr.Camnitz wants to increase Carvedilol to 25mg  BID. I told patient that he can take (2) tablets of his 12.5mg  BID until he runs out. Patient verbalized understanding. I offered to send in a new Rx for the 25mg  BID, but patient prefers to call back when he needs new script. Medication updated in patient's chart.

## 2016-12-23 NOTE — Telephone Encounter (Signed)
Attempted home # x 1-Patient at church per wife. Wife provided patient's cell number.  LMTCB/sss (cell)

## 2016-12-23 NOTE — Telephone Encounter (Signed)
Patient is returning your call.  

## 2017-01-01 DIAGNOSIS — E871 Hypo-osmolality and hyponatremia: Secondary | ICD-10-CM

## 2017-01-01 HISTORY — DX: Hypo-osmolality and hyponatremia: E87.1

## 2017-01-18 ENCOUNTER — Emergency Department (HOSPITAL_BASED_OUTPATIENT_CLINIC_OR_DEPARTMENT_OTHER)
Admission: EM | Admit: 2017-01-18 | Discharge: 2017-01-18 | Disposition: A | Discharge status: Home / Self Care | Payer: Medicare PPO | Source: Home / Self Care | Attending: Emergency Medicine | Admitting: Emergency Medicine

## 2017-01-18 ENCOUNTER — Encounter (HOSPITAL_BASED_OUTPATIENT_CLINIC_OR_DEPARTMENT_OTHER): Payer: Self-pay | Admitting: *Deleted

## 2017-01-18 DIAGNOSIS — F17210 Nicotine dependence, cigarettes, uncomplicated: Secondary | ICD-10-CM | POA: Insufficient documentation

## 2017-01-18 DIAGNOSIS — I10 Essential (primary) hypertension: Secondary | ICD-10-CM | POA: Insufficient documentation

## 2017-01-18 DIAGNOSIS — I255 Ischemic cardiomyopathy: Secondary | ICD-10-CM | POA: Insufficient documentation

## 2017-01-18 DIAGNOSIS — M62838 Other muscle spasm: Secondary | ICD-10-CM

## 2017-01-18 DIAGNOSIS — I442 Atrioventricular block, complete: Secondary | ICD-10-CM | POA: Insufficient documentation

## 2017-01-18 DIAGNOSIS — Z7901 Long term (current) use of anticoagulants: Secondary | ICD-10-CM | POA: Insufficient documentation

## 2017-01-18 DIAGNOSIS — Z950 Presence of cardiac pacemaker: Secondary | ICD-10-CM | POA: Insufficient documentation

## 2017-01-18 MED ORDER — CYCLOBENZAPRINE HCL 5 MG PO TABS: 5.0000 mg | ORAL_TABLET | Freq: Every day | ORAL | 0 refills | Status: DC

## 2017-01-18 NOTE — ED Triage Notes (Signed)
Pt c./o left neck pain x 4 days, pt unable to turn head to left d/t increased pain with movt

## 2017-01-18 NOTE — ED Provider Notes (Signed)
[facility] Provider Note   CSN: [id number] Arrival date & time: 01/18/17  1643     History   Chief Complaint Chief Complaint  Patient presents with  . Neck Pain    HPI Patrick Knox is a 76 y.o. male.  Patient reports left sided neck discomfort x 4 days. He states he thinks he "slept wrong" and awoke with neck stiffness. Discomfort increased when turning head to the left. No fever. No difficulty swallowing.   The history is provided by the patient. No language interpreter was used.  Muscle Pain  This is a new problem. The current episode started more than 2 days ago. The problem occurs constantly. The problem has not changed since onset.   Past Medical History:  Diagnosis Date  . High cholesterol   . Hypertension     Patient Active Problem List   Diagnosis Date Noted  . Encounter for therapeutic drug monitoring 06/21/2016  . Paroxysmal atrial fibrillation (HCC) 06/08/2016  . Cardiomyopathy, ischemic   . Bradycardia 02/17/2016  . 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 Heart block   . SBO (small bowel obstruction) (HCC) 12/20/2015  . Hypertension   . High cholesterol     Past Surgical History:  Procedure Laterality Date  . CORONARY ARTERY BYPASS GRAFT    . EP IMPLANTABLE DEVICE N/A 02/18/2016   Procedure: BiV Pacemaker Insertion CRT-P;  Surgeon: Duke Salvia, MD;  Location: [facility] INVASIVE CV LAB;  Service: Cardiovascular;  Laterality: N/A;  . HERNIA REPAIR         Home Medications    Prior to Admission medications   Medication Sig Start Date End Date Taking? Authorizing Provider  carvedilol (COREG) 25 MG tablet Take 1 tablet (25 mg total) by mouth 2 (two) times daily. 12/23/16 03/23/17  Camnitz, Andree Coss, MD  lisinopril (PRINIVIL,ZESTRIL) 5 MG tablet Take 1 tablet (5 mg total) by mouth daily. 06/08/16 09/06/16  Camnitz, Andree Coss, MD  rosuvastatin (CRESTOR) 40 MG tablet Take 1 tablet (40 mg total) by mouth every morning. 02/25/16   Lewayne Bunting, MD  warfarin (COUMADIN) 5 MG tablet Take 1 tablet by mouth once daily or as directed by coumadin clinic 12/06/16   Lewayne Bunting, MD    Family History Family History  Problem Relation Age of Onset  . Hypertension Mother   . Hypertension Father   . Breast cancer Sister     Social History Social History  Substance Use Topics  . Smoking status: Current Every Day Smoker    Packs/day: 0.50    Types: Cigarettes  . Smokeless tobacco: Never Used  . Alcohol use No     Allergies   Patient has no known allergies.   Review of Systems Review of Systems  Musculoskeletal: Positive for myalgias, neck pain and neck stiffness.  All other systems reviewed and are negative.    Physical Exam Updated Vital Signs BP 124/67   Pulse 65   Temp 98.2 F (36.8 C)   Resp 16   Ht  (1.854 m)   Wt 74.8 kg (165 lb)   SpO2 97%   BMI 21.77 kg/m   Physical Exam  Constitutional: He is oriented to person, place, and time. He appears well-developed and well-nourished.  HENT:  Head: Atraumatic.  Eyes: Conjunctivae are normal.  Neck: Neck supple.  Cardiovascular: Normal rate.   Pulmonary/Chest: Effort normal and breath sounds normal.  Abdominal: Soft.  Musculoskeletal: He exhibits tenderness.  Neurological: He is alert and  oriented to person, place, and time.  Skin: Skin is warm and dry.  Psychiatric: He has a normal mood and affect.  Nursing note and vitals reviewed.    ED Treatments / Results  Labs (all labs ordered are listed, but only abnormal results are displayed) Labs Reviewed - No data to display  EKG  EKG Interpretation None       Radiology No results found.  Procedures Procedures (including critical care time)  Medications Ordered in ED Medications - No data to display   Initial Impression / Assessment and Plan / ED Course  I have reviewed the triage vital signs and the nursing notes.  Pertinent labs & imaging results that were available during  my care of the patient were reviewed by me and considered in my medical decision making (see chart for details).   Patient discussed with Dr. Eudelia Bunch.  Patient with muscle spasm of neck. No sign of infection. No neurologic findings. Care instructions provided and return precautions discussed. Follow-up with PCP.  Final Clinical Impressions(s) / ED Diagnoses   Final diagnoses:  Muscle spasms of neck    New Prescriptions Discharge Medication List as of 01/18/2017  7:03 PM    START taking these medications   Details  cyclobenzaprine (FLEXERIL) 5 MG tablet Take 1 tablet (5 mg total) by mouth at bedtime., Starting Tue 01/18/2017, Print         Felicie Morn, NP 01/19/17 0102    Nira Conn, MD 01/24/17 [phone]

## 2017-01-21 ENCOUNTER — Encounter (HOSPITAL_BASED_OUTPATIENT_CLINIC_OR_DEPARTMENT_OTHER): Payer: Self-pay | Admitting: *Deleted

## 2017-01-21 ENCOUNTER — Inpatient Hospital Stay (HOSPITAL_COMMUNITY): Payer: Medicare PPO

## 2017-01-21 ENCOUNTER — Emergency Department (HOSPITAL_BASED_OUTPATIENT_CLINIC_OR_DEPARTMENT_OTHER): Payer: Medicare PPO

## 2017-01-21 ENCOUNTER — Inpatient Hospital Stay (HOSPITAL_BASED_OUTPATIENT_CLINIC_OR_DEPARTMENT_OTHER)
Admission: EM | Admit: 2017-01-21 | Discharge: 2017-02-05 | DRG: 260 | Disposition: A | Discharge status: Skilled Nursing Facility | Payer: Medicare PPO | Attending: Internal Medicine | Admitting: Internal Medicine

## 2017-01-21 DIAGNOSIS — N179 Acute kidney failure, unspecified: Secondary | ICD-10-CM | POA: Diagnosis present

## 2017-01-21 DIAGNOSIS — E7800 Pure hypercholesterolemia, unspecified: Secondary | ICD-10-CM | POA: Diagnosis present

## 2017-01-21 DIAGNOSIS — I248 Other forms of acute ischemic heart disease: Secondary | ICD-10-CM | POA: Diagnosis present

## 2017-01-21 DIAGNOSIS — Z720 Tobacco use: Secondary | ICD-10-CM | POA: Diagnosis not present

## 2017-01-21 DIAGNOSIS — I10 Essential (primary) hypertension: Secondary | ICD-10-CM | POA: Diagnosis not present

## 2017-01-21 DIAGNOSIS — Y831 Surgical operation with implant of artificial internal device as the cause of abnormal reaction of the patient, or of later complication, without mention of misadventure at the time of the procedure: Secondary | ICD-10-CM | POA: Diagnosis present

## 2017-01-21 DIAGNOSIS — Z959 Presence of cardiac and vascular implant and graft, unspecified: Secondary | ICD-10-CM | POA: Diagnosis not present

## 2017-01-21 DIAGNOSIS — E871 Hypo-osmolality and hyponatremia: Secondary | ICD-10-CM | POA: Diagnosis present

## 2017-01-21 DIAGNOSIS — A4101 Sepsis due to Methicillin susceptible Staphylococcus aureus: Secondary | ICD-10-CM | POA: Diagnosis present

## 2017-01-21 DIAGNOSIS — B999 Unspecified infectious disease: Secondary | ICD-10-CM

## 2017-01-21 DIAGNOSIS — T827XXD Infection and inflammatory reaction due to other cardiac and vascular devices, implants and grafts, subsequent encounter: Secondary | ICD-10-CM | POA: Diagnosis not present

## 2017-01-21 DIAGNOSIS — R7989 Other specified abnormal findings of blood chemistry: Secondary | ICD-10-CM | POA: Diagnosis present

## 2017-01-21 DIAGNOSIS — I130 Hypertensive heart and chronic kidney disease with heart failure and stage 1 through stage 4 chronic kidney disease, or unspecified chronic kidney disease: Secondary | ICD-10-CM | POA: Diagnosis present

## 2017-01-21 DIAGNOSIS — A419 Sepsis, unspecified organism: Secondary | ICD-10-CM | POA: Diagnosis not present

## 2017-01-21 DIAGNOSIS — E860 Dehydration: Secondary | ICD-10-CM | POA: Diagnosis present

## 2017-01-21 DIAGNOSIS — N183 Chronic kidney disease, stage 3 (moderate): Secondary | ICD-10-CM | POA: Diagnosis present

## 2017-01-21 DIAGNOSIS — E872 Acidosis: Secondary | ICD-10-CM | POA: Diagnosis present

## 2017-01-21 DIAGNOSIS — I959 Hypotension, unspecified: Secondary | ICD-10-CM | POA: Diagnosis present

## 2017-01-21 DIAGNOSIS — M549 Dorsalgia, unspecified: Secondary | ICD-10-CM | POA: Diagnosis not present

## 2017-01-21 DIAGNOSIS — N170 Acute kidney failure with tubular necrosis: Secondary | ICD-10-CM | POA: Diagnosis present

## 2017-01-21 DIAGNOSIS — I480 Paroxysmal atrial fibrillation: Secondary | ICD-10-CM | POA: Diagnosis present

## 2017-01-21 DIAGNOSIS — N390 Urinary tract infection, site not specified: Secondary | ICD-10-CM | POA: Diagnosis present

## 2017-01-21 DIAGNOSIS — R748 Abnormal levels of other serum enzymes: Secondary | ICD-10-CM

## 2017-01-21 DIAGNOSIS — K5900 Constipation, unspecified: Secondary | ICD-10-CM | POA: Diagnosis not present

## 2017-01-21 DIAGNOSIS — I340 Nonrheumatic mitral (valve) insufficiency: Secondary | ICD-10-CM | POA: Diagnosis not present

## 2017-01-21 DIAGNOSIS — T829XXA Unspecified complication of cardiac and vascular prosthetic device, implant and graft, initial encounter: Secondary | ICD-10-CM | POA: Diagnosis not present

## 2017-01-21 DIAGNOSIS — F17210 Nicotine dependence, cigarettes, uncomplicated: Secondary | ICD-10-CM | POA: Diagnosis present

## 2017-01-21 DIAGNOSIS — I442 Atrioventricular block, complete: Secondary | ICD-10-CM | POA: Diagnosis present

## 2017-01-21 DIAGNOSIS — Z7901 Long term (current) use of anticoagulants: Secondary | ICD-10-CM

## 2017-01-21 DIAGNOSIS — B9561 Methicillin susceptible Staphylococcus aureus infection as the cause of diseases classified elsewhere: Secondary | ICD-10-CM | POA: Diagnosis present

## 2017-01-21 DIAGNOSIS — T827XXA Infection and inflammatory reaction due to other cardiac and vascular devices, implants and grafts, initial encounter: Secondary | ICD-10-CM | POA: Diagnosis present

## 2017-01-21 DIAGNOSIS — E785 Hyperlipidemia, unspecified: Secondary | ICD-10-CM | POA: Diagnosis present

## 2017-01-21 DIAGNOSIS — Z8249 Family history of ischemic heart disease and other diseases of the circulatory system: Secondary | ICD-10-CM

## 2017-01-21 DIAGNOSIS — I361 Nonrheumatic tricuspid (valve) insufficiency: Secondary | ICD-10-CM | POA: Diagnosis not present

## 2017-01-21 DIAGNOSIS — R109 Unspecified abdominal pain: Secondary | ICD-10-CM | POA: Diagnosis not present

## 2017-01-21 DIAGNOSIS — G9341 Metabolic encephalopathy: Secondary | ICD-10-CM | POA: Diagnosis present

## 2017-01-21 DIAGNOSIS — A4901 Methicillin susceptible Staphylococcus aureus infection, unspecified site: Secondary | ICD-10-CM | POA: Diagnosis not present

## 2017-01-21 DIAGNOSIS — Y838 Other surgical procedures as the cause of abnormal reaction of the patient, or of later complication, without mention of misadventure at the time of the procedure: Secondary | ICD-10-CM | POA: Diagnosis not present

## 2017-01-21 DIAGNOSIS — Z950 Presence of cardiac pacemaker: Secondary | ICD-10-CM | POA: Diagnosis not present

## 2017-01-21 DIAGNOSIS — I2510 Atherosclerotic heart disease of native coronary artery without angina pectoris: Secondary | ICD-10-CM | POA: Diagnosis present

## 2017-01-21 DIAGNOSIS — R778 Other specified abnormalities of plasma proteins: Secondary | ICD-10-CM | POA: Diagnosis present

## 2017-01-21 DIAGNOSIS — Z951 Presence of aortocoronary bypass graft: Secondary | ICD-10-CM | POA: Diagnosis not present

## 2017-01-21 DIAGNOSIS — R7881 Bacteremia: Secondary | ICD-10-CM | POA: Diagnosis not present

## 2017-01-21 DIAGNOSIS — I5022 Chronic systolic (congestive) heart failure: Secondary | ICD-10-CM | POA: Diagnosis present

## 2017-01-21 DIAGNOSIS — I9589 Other hypotension: Secondary | ICD-10-CM | POA: Diagnosis not present

## 2017-01-21 DIAGNOSIS — Z7982 Long term (current) use of aspirin: Secondary | ICD-10-CM | POA: Diagnosis not present

## 2017-01-21 HISTORY — DX: Heart failure, unspecified: I50.9

## 2017-01-21 HISTORY — DX: Hypo-osmolality and hyponatremia: E87.1

## 2017-01-21 HISTORY — DX: Chronic kidney disease, stage 3 (moderate): N18.3

## 2017-01-21 HISTORY — DX: Chronic kidney disease, stage 3 unspecified: N18.30

## 2017-01-21 HISTORY — DX: Unspecified atrial fibrillation: I48.91

## 2017-01-21 LAB — URINALYSIS, MICROSCOPIC (REFLEX)

## 2017-01-21 LAB — CBC WITH DIFFERENTIAL/PLATELET
BASOS ABS: 0 10*3/uL (ref 0.0–0.1)
BASOS PCT: 0 %
Eosinophils Absolute: 0 10*3/uL (ref 0.0–0.7)
Eosinophils Relative: 0 %
HEMATOCRIT: 41 % (ref 39.0–52.0)
Hemoglobin: 14.3 g/dL (ref 13.0–17.0)
Lymphocytes Relative: 5 %
Lymphs Abs: 0.6 10*3/uL — ABNORMAL LOW (ref 0.7–4.0)
MCH: 29.1 pg (ref 26.0–34.0)
MCHC: 34.9 g/dL (ref 30.0–36.0)
MCV: 83.5 fL (ref 78.0–100.0)
MONO ABS: 1 10*3/uL (ref 0.1–1.0)
Monocytes Relative: 9 %
Neutro Abs: 9.8 10*3/uL — ABNORMAL HIGH (ref 1.7–7.7)
Neutrophils Relative %: 86 %
PLATELETS: 193 10*3/uL (ref 150–400)
RBC: 4.91 MIL/uL (ref 4.22–5.81)
RDW: 14.9 % (ref 11.5–15.5)
WBC: 11.4 10*3/uL — ABNORMAL HIGH (ref 4.0–10.5)

## 2017-01-21 LAB — COMPREHENSIVE METABOLIC PANEL
ALK PHOS: 54 U/L (ref 38–126)
ALT: 83 U/L — AB (ref 17–63)
AST: 173 U/L — ABNORMAL HIGH (ref 15–41)
Albumin: 3.3 g/dL — ABNORMAL LOW (ref 3.5–5.0)
Anion gap: 12 (ref 5–15)
BILIRUBIN TOTAL: 1.5 mg/dL — AB (ref 0.3–1.2)
BUN: 84 mg/dL — ABNORMAL HIGH (ref 6–20)
CALCIUM: 9.1 mg/dL (ref 8.9–10.3)
CO2: 18 mmol/L — AB (ref 22–32)
CREATININE: 3.4 mg/dL — AB (ref 0.61–1.24)
Chloride: 97 mmol/L — ABNORMAL LOW (ref 101–111)
GFR, EST AFRICAN AMERICAN: 19 mL/min — AB (ref >60)
GFR, EST NON AFRICAN AMERICAN: 16 mL/min — AB (ref >60)
Glucose, Bld: 192 mg/dL — ABNORMAL HIGH (ref 65–99)
Potassium: 4.4 mmol/L (ref 3.5–5.1)
Sodium: 127 mmol/L — ABNORMAL LOW (ref 135–145)
Total Protein: 8.1 g/dL (ref 6.5–8.1)

## 2017-01-21 LAB — URINALYSIS, ROUTINE W REFLEX MICROSCOPIC
Bilirubin Urine: NEGATIVE
Glucose, UA: NEGATIVE mg/dL
KETONES UR: NEGATIVE mg/dL
LEUKOCYTES UA: NEGATIVE
NITRITE: NEGATIVE
PH: 5.5 (ref 5.0–8.0)
Specific Gravity, Urine: 1.025 (ref 1.005–1.030)

## 2017-01-21 LAB — I-STAT CG4 LACTIC ACID, ED
LACTIC ACID, VENOUS: 2.53 mmol/L — AB (ref 0.5–1.9)
Lactic Acid, Venous: 1.79 mmol/L (ref 0.5–1.9)

## 2017-01-21 LAB — GLUCOSE, CAPILLARY: Glucose-Capillary: 143 mg/dL — ABNORMAL HIGH (ref 65–99)

## 2017-01-21 LAB — PROTIME-INR
INR: 1.8
PROTHROMBIN TIME: 20.7 s — AB (ref 11.4–15.2)

## 2017-01-21 LAB — TROPONIN I
TROPONIN I: 0.18 ng/mL — AB (ref <0.03)
Troponin I: 0.18 ng/mL (ref <0.03)

## 2017-01-21 MED ORDER — HYDRALAZINE HCL 20 MG/ML IJ SOLN: 5.0000 mg | INTRAMUSCULAR | Status: DC | PRN

## 2017-01-21 MED ORDER — NICOTINE 21 MG/24HR TD PT24
21.0000 mg | MEDICATED_PATCH | Freq: Every day | TRANSDERMAL | Status: DC
  Administered 2017-01-22: 21 mg via TRANSDERMAL
  Filled 2017-01-21: qty 1

## 2017-01-21 MED ORDER — ASPIRIN 300 MG RE SUPP
150.0000 mg | Freq: Every day | RECTAL | Status: DC
  Administered 2017-01-22: 150 mg via RECTAL
  Filled 2017-01-21 (×2): qty 1

## 2017-01-21 MED ORDER — ACETAMINOPHEN 325 MG PO TABS
650.0000 mg | ORAL_TABLET | Freq: Four times a day (QID) | ORAL | Status: DC | PRN
Start: 2017-01-21 — End: 2017-01-25
  Administered 2017-01-22: 650 mg via ORAL
  Filled 2017-01-21: qty 2

## 2017-01-21 MED ORDER — VANCOMYCIN HCL IN DEXTROSE 750-5 MG/150ML-% IV SOLN
750.0000 mg | INTRAVENOUS | Status: DC
  Administered 2017-01-22: 750 mg via INTRAVENOUS
  Filled 2017-01-21 (×2): qty 150

## 2017-01-21 MED ORDER — MORPHINE SULFATE (PF) 2 MG/ML IV SOLN: 1.0000 mg | INTRAVENOUS | Status: DC | PRN

## 2017-01-21 MED ORDER — VANCOMYCIN HCL IN DEXTROSE 1-5 GM/200ML-% IV SOLN
1000.0000 mg | Freq: Once | INTRAVENOUS | Status: AC
Start: 2017-01-21 — End: 2017-01-21
  Administered 2017-01-21: 1000 mg via INTRAVENOUS
  Filled 2017-01-21: qty 200

## 2017-01-21 MED ORDER — SODIUM CHLORIDE 0.9 % IV SOLN
INTRAVENOUS | Status: DC
  Administered 2017-01-21 – 2017-01-22 (×2): via INTRAVENOUS

## 2017-01-21 MED ORDER — PIPERACILLIN-TAZOBACTAM 3.375 G IVPB 30 MIN
3.3750 g | Freq: Once | INTRAVENOUS | Status: AC
  Administered 2017-01-21: 3.375 g via INTRAVENOUS
  Filled 2017-01-21 (×2): qty 50

## 2017-01-21 MED ORDER — ONDANSETRON HCL 4 MG PO TABS: 4.0000 mg | ORAL_TABLET | Freq: Four times a day (QID) | ORAL | Status: DC | PRN

## 2017-01-21 MED ORDER — ACETAMINOPHEN 650 MG RE SUPP: 650.0000 mg | Freq: Four times a day (QID) | RECTAL | Status: DC | PRN

## 2017-01-21 MED ORDER — ONDANSETRON HCL 4 MG/2ML IJ SOLN: 4.0000 mg | Freq: Four times a day (QID) | INTRAMUSCULAR | Status: DC | PRN

## 2017-01-21 MED ORDER — SODIUM CHLORIDE 0.9 % IV BOLUS (SEPSIS)
500.0000 mL | Freq: Once | INTRAVENOUS | Status: AC
  Administered 2017-01-21: 500 mL via INTRAVENOUS

## 2017-01-21 MED ORDER — PIPERACILLIN-TAZOBACTAM IN DEX 2-0.25 GM/50ML IV SOLN
2.2500 g | Freq: Three times a day (TID) | INTRAVENOUS | Status: DC
  Administered 2017-01-22: 2.25 g via INTRAVENOUS
  Filled 2017-01-21 (×4): qty 50

## 2017-01-21 NOTE — Progress Notes (Signed)
Patient off unit for CT

## 2017-01-21 NOTE — Progress Notes (Signed)
Patient back from CT.

## 2017-01-21 NOTE — ED Notes (Signed)
Primary rn Christy, rn and dr. Rubin Payor alerted to troponin of 0.18

## 2017-01-21 NOTE — Progress Notes (Signed)
75 yo presented with weakness, fatigue and dizziness. He was noted to be hypotensive, hyponatremic and have acute renal failure. Hypotension has responded nicely to normal saline bolus. Of note he had been started on Flexeril a few days earlier and has had decreased po intake since then. Also of note is that physical exam revealed exposed pacemaker wires in his left upper chest. There is no evidence of cellulitis on the skin around it. Blood cultures have been drawn. Patient got Vanco and Zosyn. He is afebrile and has a white count of 11.9. Cardiology has been called who will contact EP to change that out. Patient has a history of complete heart block. I will admit patient to telemetry as an inpatient.

## 2017-01-21 NOTE — Progress Notes (Signed)
Unable to do admission assessment because patient was not able to answer questions appropriately. Patient does not know why he is in the hospital  and who brought him to the hospital.  Patient oriented to himself at this time

## 2017-01-21 NOTE — ED Notes (Signed)
Open skin area to L upper chest covered with sterile pink foam dressing.

## 2017-01-21 NOTE — ED Triage Notes (Signed)
Pt son states his mother called him and said "your dad is weak, sweating and short of breath", pt is slow to respond to questions at triage, son states he also seems confused, "talking out of his head."

## 2017-01-21 NOTE — ED Notes (Signed)
Pt in wheelchair. 

## 2017-01-21 NOTE — Progress Notes (Signed)
Dr Lorretta Harp at bedside to evaluate patient

## 2017-01-21 NOTE — Progress Notes (Signed)
Patient arrived to unit via stretcher shivering  Rigorously, Warm blankets placed on patient and room temperature increased. As per patient shivering occurred on his was to [facility]. Call bell within reach and bed alarm activated

## 2017-01-21 NOTE — Progress Notes (Signed)
Patient stopped shivering after increasing room temperature and placing blankets on patient

## 2017-01-21 NOTE — Progress Notes (Signed)
Patrick Knox text paged to inform patient has arrived to unit and patient needs orders.

## 2017-01-21 NOTE — ED Notes (Signed)
ED Provider at bedside. 

## 2017-01-21 NOTE — H&P (Addendum)
History and Physical    Patrick Knox:[id number] DOB: [birth year] DOA: 01/21/2017  Referring MD/NP/PA:   PCP: Kirby Funk, MD   Patient coming from:  The patient is coming from home.  At baseline, pt is independent for most of ADL.  Chief Complaint: AMS, weakness   HPI: Patrick Knox is a 76 y.o. male with medical history significant of hypertension, hyperlipidemia, sCHF with EF 35-4%, A fib on Coumadin, bradycardia, complete heart block, pacemaker placement, CAD, s/p of CABG, tobacco abuse, CKD-3, who presents with altered mental status and weakness.  Patient has AMS, and is unable to provide accurate medical history, therefore, most of the history is obtained by discussing the case with ED physician, per EMS report, and with the nursing staff.  Per report, family reported that pt has is weak, sweating, dizzy and with short of breath. Pt is confused and "talking out of his head."  Not sure if he had any fall. No active cough, respiratory distress, nausea, vomiting, diarrhea noted. He moves all extremities normally. Patient was hypotensive with blood pressure 86/46 in ED, which responded to NS bolus and improved to 163/89 after 500 cc x 2 of NS bolus. Of note, he had been started on Flexeril a few days earlier and has had decreased po intake since then. Also of note is that physical exam revealed exposed pacemaker wires in his left upper chest.   ED Course: pt was found to have negative WBC 11.4, left acid 2.53, 1.79, INR 1.8, positive troponin 0.18, BNP 764, worsening renal function, sodium 127, negative urinalysis, temperature 99.4, bradycardia, tachypnea, oxygen saturation section 94% on room air, negative chest x-ray. CT head for acute intracranial abnormalities. Patient is admitted to telemetry bed as inpatient. EDP consulted Dr. Anne Fu from cardiology and he states electrophysiology will see patient.  Review of Systems:  Could not be reviewed accurately due to altered mental  status.  Allergy: No Known Allergies  Past Medical History:  Diagnosis Date  . High cholesterol   . Hypertension     Past Surgical History:  Procedure Laterality Date  . CORONARY ARTERY BYPASS GRAFT    . EP IMPLANTABLE DEVICE N/A 02/18/2016   Procedure: BiV Pacemaker Insertion CRT-P;  Surgeon: Duke Salvia, MD;  Location: [facility] INVASIVE CV LAB;  Service: Cardiovascular;  Laterality: N/A;  . HERNIA REPAIR      Social History:  reports that he has been smoking Cigarettes.  He has been smoking about 0.50 packs per day. He has never used smokeless tobacco. He reports that he does not drink alcohol or use drugs.  Family History:  Family History  Problem Relation Age of Onset  . Hypertension Mother   . Hypertension Father   . Breast cancer Sister      Prior to Admission medications   Medication Sig Start Date End Date Taking? Authorizing Provider  carvedilol (COREG) 25 MG tablet Take 1 tablet (25 mg total) by mouth 2 (two) times daily. 12/23/16 03/23/17  Camnitz, Andree Coss, MD  cyclobenzaprine (FLEXERIL) 5 MG tablet Take 1 tablet (5 mg total) by mouth at bedtime. 01/18/17   Felicie Morn, NP  lisinopril (PRINIVIL,ZESTRIL) 5 MG tablet Take 1 tablet (5 mg total) by mouth daily. 06/08/16 09/06/16  Camnitz, Andree Coss, MD  rosuvastatin (CRESTOR) 40 MG tablet Take 1 tablet (40 mg total) by mouth every morning. 02/25/16   Lewayne Bunting, MD  warfarin (COUMADIN) 5 MG tablet Take 1 tablet by mouth once daily or as directed by coumadin  clinic 12/06/16   Lewayne Bunting, MD    Physical Exam: Vitals:   01/21/17 1530 01/21/17 1738 01/21/17 1845 01/21/17 2133  BP: 122/77 (!) 130/92 (!) 144/79 (!) 163/89  Pulse: 64 70 66 88  Resp: (!) Temp:    99.4 F (37.4 C)  TempSrc:    Oral  SpO2: 98% 94% 100% 94%  Weight:    75.6 kg (166 lb 10.7 oz)  Height:     (1.854 m)   General: Not in acute distress HEENT:       Eyes: PERRL, EOMI, no scleral icterus.       ENT: No discharge  from the ears and nose, no pharynx injection, no tonsillar enlargement.        Neck: No JVD, no bruit, no mass felt. Heme: No neck lymph node enlargement. Cardiac: S1/S2, RRR, No murmurs, No gallops or rubs. Respiratory:  No rales, wheezing, rhonchi or rubs. GI: Soft, nondistended, nontender, no organomegaly, BS present. GU: No hematuria Ext: No pitting leg edema bilaterally. 2+DP/PT pulse bilaterally. Musculoskeletal: No joint deformities, No joint redness or warmth, no limitation of ROM in spin. Skin: No rashes.  Neuro: confused, oriented to place, but not to time and person. cranial nerves II-XII grossly intact, moves all extremities. Psych: Patient is not psychotic, no suicidal or hemocidal ideation.  Labs on Admission: I have personally reviewed following labs and imaging studies  CBC:  Recent Labs Lab 01/21/17 1030  WBC 11.4*  NEUTROABS 9.8*  HGB 14.3  HCT 41.0  MCV 83.5  PLT 193   Basic Metabolic Panel:  Recent Labs Lab 01/21/17 1030 01/21/17 2329  NA 127* 131*  K 4.4 4.1  CL 97* 103  CO2 18* 15*  GLUCOSE 192* 142*  BUN 84* 75*  CREATININE 3.40* 2.65*  CALCIUM 9.1 9.0   GFR: Estimated Creatinine Clearance: 25.8 mL/min (A) (by C-G formula based on SCr of 2.65 mg/dL (H)). Liver Function Tests:  Recent Labs Lab 01/21/17 1030  AST 173*  ALT 83*  ALKPHOS 54  BILITOT 1.5*  PROT 8.1  ALBUMIN 3.3*   No results for input(s): LIPASE, AMYLASE in the last 168 hours. No results for input(s): AMMONIA in the last 168 hours. Coagulation Profile:  Recent Labs Lab 01/21/17 1030  INR 1.80   Cardiac Enzymes:  Recent Labs Lab 01/21/17 1030 01/21/17 1530 01/21/17 2329  TROPONINI 0.18* 0.18* 0.17*   BNP (last 3 results) No results for input(s): PROBNP in the last 8760 hours. HbA1C: No results for input(s): HGBA1C in the last 72 hours. CBG:  Recent Labs Lab 01/21/17 2127  GLUCAP 143*   Lipid Profile: No results for input(s): CHOL, HDL, LDLCALC,  TRIG, CHOLHDL, LDLDIRECT in the last 72 hours. Thyroid Function Tests: No results for input(s): TSH, T4TOTAL, FREET4, T3FREE, THYROIDAB in the last 72 hours. Anemia Panel: No results for input(s): VITAMINB12, FOLATE, FERRITIN, TIBC, IRON, RETICCTPCT in the last 72 hours. Urine analysis:    Component Value Date/Time   COLORURINE YELLOW 01/21/2017 1215   APPEARANCEUR CLOUDY (A) 01/21/2017 1215   LABSPEC 1.025 01/21/2017 1215   PHURINE 5.5 01/21/2017 1215   GLUCOSEU NEGATIVE 01/21/2017 1215   HGBUR LARGE (A) 01/21/2017 1215   BILIRUBINUR NEGATIVE 01/21/2017 1215   KETONESUR NEGATIVE 01/21/2017 1215   PROTEINUR >300 (A) 01/21/2017 1215   NITRITE NEGATIVE 01/21/2017 1215   LEUKOCYTESUR NEGATIVE 01/21/2017 1215   Sepsis Labs: (procalcitonin:4,lacticidven:4) ) Recent Results (from the past 240 hour(s))  Culture,  blood (routine x 2)     Status: None (Preliminary result)   Collection Time: 01/21/17 11:08 AM  Result Value Ref Range Status   Specimen Description BLOOD LEFT WRIST  Final   Special Requests   Final    BOTTLES DRAWN AEROBIC AND ANAEROBIC Blood Culture adequate volume   Culture  Setup Time   Final    GRAM POSITIVE COCCI IN CLUSTERS AEROBIC BOTTLE ONLY Organism ID to follow Performed at [facility], [address] [place], [place] [address]    Culture PENDING  Incomplete   Report Status PENDING  Incomplete     Radiological Exams on Admission: Ct Head Wo Contrast  Result Date: 01/21/2017 CLINICAL DATA:  Altered level consciousness. EXAM: CT HEAD WITHOUT CONTRAST TECHNIQUE: Contiguous axial images were obtained from the base of the skull through the vertex without intravenous contrast. COMPARISON:  None. FINDINGS: Brain: No acute intracranial hemorrhage. No focal mass lesion. No CT evidence of acute infarction. No midline shift or mass effect. No hydrocephalus. Basilar cisterns are patent. Vascular: No hyperdense vessel or unexpected calcification. Skull:  Normal. Negative for fracture or focal lesion. Sinuses/Orbits: Paranasal sinuses and mastoid air cells are clear. Orbits are clear. Other: None. IMPRESSION: No acute intracranial findings. Electronically Signed   By: Genevive Bi M.D.   On: 01/21/2017 23:18   Dg Chest Portable 1 View  Result Date: 01/21/2017 CLINICAL DATA:  Neck pain.  Nonsmoker. EXAM: PORTABLE CHEST 1 VIEW COMPARISON:  04/09/2016; 06/10/2005 FINDINGS: Grossly unchanged cardiac silhouette and mediastinal contours given reduced lung volumes and AP projection. Post median sternotomy and CABG. Stable position of support apparatus. No focal airspace opacities. Minimal left basilar opacities favored to represent atelectasis. No discrete focal airspace opacities. No pleural effusion or pneumothorax. No evidence of edema. No definite acute osseus abnormalities. Several mildly dilated loops of bowel are seen below left hemidiaphragm. IMPRESSION: No definite acute cardiopulmonary disease on this AP portable examination. Further evaluation with a PA and lateral chest radiograph may be obtained as clinically indicated. Electronically Signed   By: Simonne Come M.D.   On: 01/21/2017 11:21     EKG: Independently reviewed.  Pacer rhythm, QTC 457, nonspecific T-wave change.   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 The source of infection is not clear. Urinalysis negative. Chest x-ray negative. Patient has exposed pacemaker wires-->? Infection. Lactic acid is elevated at 2.53, which is normalized, 0.17 now.  -will admit  to tele bed as inpt -IV vancomycin and Zosyn -will get Procalcitonin and trend lactic acid levels per sepsis protocol. -IVF: 500 cc x 2 NS bolus in ED, followed by 75 cc/h (patient has EF 35-40%, limiting aggressive IV fluids treatment). -f/u Bx and Ux  Hypotension:  Possibly due to combination of dehydration and sepsis. Blood pressure improved quickly with IV fluids. -IV fluid as above  Elevated trop: trop 0.18  no chest pain. Possibly due to demand ischemia secondary to sepsis and hypotension. - cycle CE q6 x3 and repeat EKG in the am  - prn Morphine - ASA per rectal - will not give NTG  SL and crestor due to AMS  - Risk factor stratification: will check FLP and A1C  - 2d echo  Acute metabolic encephalopath: likely due to sepsis. CT-head negative -see above -Frequent neuro check -hold oral medications until mental status improves  HTN: -Hold oral medications -IV hydralazine when necessary  Hyperlipidemia, -Hold her Crestor  Paroxysmal atrial fibrillation (HCC): CHA2DS2-VASc Score is 5, needs oral anticoagulation. Patient is on Coumadin at home. INR is 1.8 on admission. Heart rate is well controlled. -switch coumadin to IV heparin due to altered mental status, and also because of positive troponin 0.18   AoCKD-III: Baseline Cre is 1.1-1.2, pt's Cre is 3.40 on admission. Likely due to prerenal secondary to dehydration and continuation of ACEI - IVF as above - Check FeNa - Follow up renal function by BMP - Hold lisinopril  Chronic systolic CHF (congestive heart failure) (HCC): 2-D echo on 02/18/16 showed EF of 35-40 percent. Patient does not have leg edema JVD, but BNP 764.7, indicating possible mild exacerbation. Given patient has a sepsis, we cannot start diuretics. -Continue aspirin and Coreg  Hyponatremia: Na 127. Likely due to decreased oral intake, dehydration and continuation of lisinopril -Check TSH -IV fluid as above  Tobacco abuse -Nicotine patch  Pace maker  issues: pt has exposed pacemaker wires -EDP consulted Dr. Anne Fu from cardiology and he states electrophysiology will see patient.   DVT ppx: on IV Heparin      Code Status: Full code Family Communication: None at bed side. Disposition Plan:  Anticipate discharge back to previous home environment Consults called:  EDP consulted Dr. Anne Fu, EP will see pt Admission status: Inpatient/tele     Date of Service 01/22/2017    Lorretta Harp [facility] Pager [phone]  If 7PM-7AM, please contact night-coverage [email or web address] Password [facility] 01/22/2017, 3:22 AM

## 2017-01-21 NOTE — ED Notes (Signed)
Pt on cardiac monitor and auto VS 

## 2017-01-21 NOTE — ED Notes (Signed)
Primary rn diane notified of pt troponin 0.18

## 2017-01-21 NOTE — ED Provider Notes (Addendum)
[facility] Provider Note   CSN: [id number] Arrival date & time: 01/21/17  1000     History   Chief Complaint Chief Complaint  Patient presents with  . Altered Mental Status    HPI Patrick Knox is a 76 y.o. male.  HPI  Patient brought in because he was " weak, sweating and short of breath."Patient son states that he was also a little confused. Patient states he does have some right-sided chest pain. Denies fevers. Recently seen in the ER for neck pain and started on Flexeril. States he had been taking this. No real cough. No real shortness of breath. No nausea vomiting. No fevers. Patient has a pacemaker. Looking distress there is exposed leads on his left upper chest wall with a Band-Aid over it. No swelling in his legs. He states he has been taking his medicine. No headache.  Past Medical History:  Diagnosis Date  . High cholesterol   . Hypertension     Patient Active Problem List   Diagnosis Date Noted  . Encounter for therapeutic drug monitoring 06/21/2016  . Paroxysmal atrial fibrillation (HCC) 06/08/2016  . Cardiomyopathy, ischemic   . Bradycardia 02/17/2016  . 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 Heart block   . SBO (small bowel obstruction) (HCC) 12/20/2015  . Hypertension   . High cholesterol     Past Surgical History:  Procedure Laterality Date  . CORONARY ARTERY BYPASS GRAFT    . EP IMPLANTABLE DEVICE N/A 02/18/2016   Procedure: BiV Pacemaker Insertion CRT-P;  Surgeon: Duke Salvia, MD;  Location: [facility] INVASIVE CV LAB;  Service: Cardiovascular;  Laterality: N/A;  . HERNIA REPAIR         Home Medications    Prior to Admission medications   Medication Sig Start Date End Date Taking? Authorizing Provider  carvedilol (COREG) 25 MG tablet Take 1 tablet (25 mg total) by mouth 2 (two) times daily. 12/23/16 03/23/17  Camnitz, Andree Coss, MD  cyclobenzaprine (FLEXERIL) 5 MG tablet Take 1 tablet (5 mg total) by mouth at bedtime. 01/18/17    Felicie Morn, NP  lisinopril (PRINIVIL,ZESTRIL) 5 MG tablet Take 1 tablet (5 mg total) by mouth daily. 06/08/16 09/06/16  Camnitz, Andree Coss, MD  rosuvastatin (CRESTOR) 40 MG tablet Take 1 tablet (40 mg total) by mouth every morning. 02/25/16   Lewayne Bunting, MD  warfarin (COUMADIN) 5 MG tablet Take 1 tablet by mouth once daily or as directed by coumadin clinic 12/06/16   Lewayne Bunting, MD    Family History Family History  Problem Relation Age of Onset  . Hypertension Mother   . Hypertension Father   . Breast cancer Sister     Social History Social History  Substance Use Topics  . Smoking status: Current Every Day Smoker    Packs/day: 0.50    Types: Cigarettes  . Smokeless tobacco: Never Used  . Alcohol use No     Allergies   Patient has no known allergies.   Review of Systems Review of Systems  Constitutional: Positive for diaphoresis. Negative for appetite change.  HENT: Negative for congestion.   Respiratory: Positive for shortness of breath.   Cardiovascular: Positive for chest pain.  Gastrointestinal: Negative for abdominal pain.  Genitourinary: Negative for flank pain.  Musculoskeletal: Negative for back pain.  Skin: Positive for wound.  Neurological: Negative for seizures.  Hematological: Negative for adenopathy.  Psychiatric/Behavioral: Negative for confusion.     Physical Exam Updated Vital Signs BP 107/67  Pulse 68   Temp 98.3 F (36.8 C) (Oral)   Resp (!) 23   Ht  (1.854 m)   Wt 74.8 kg (165 lb)   SpO2 97%   BMI 21.77 kg/m   Physical Exam  Constitutional: He appears well-developed.  HENT:  Head: Atraumatic.  Eyes: Pupils are equal, round, and reactive to light.  Neck: Neck supple.  Cardiovascular: Normal rate.   Pulmonary/Chest: Effort normal.  Pacemaker to left upper chest wall with exposed proximal leads.  Abdominal: Soft. There is no tenderness.  Musculoskeletal: He exhibits no edema.  Neurological: He is alert.  Patient is  awake and moving his extremities. Appears able to answer most questions appropriately. Able to tell me that he has been taking his Flexeril medicine helped somewhat with his neck pain.  Skin: Skin is warm. Capillary refill takes less than 2 seconds.       ED Treatments / Results  Labs (all labs ordered are listed, but only abnormal results are displayed) Labs Reviewed  COMPREHENSIVE METABOLIC PANEL - Abnormal; Notable for the following:       Result Value   Sodium 127 (*)    Chloride 97 (*)    CO2 18 (*)    Glucose, Bld 192 (*)    BUN 84 (*)    Creatinine, Ser 3.40 (*)    Albumin 3.3 (*)    AST 173 (*)    ALT 83 (*)    Total Bilirubin 1.5 (*)    GFR calc non Af Amer 16 (*)    GFR calc Af Amer 19 (*)    All other components within normal limits  TROPONIN I - Abnormal; Notable for the following:    Troponin I 0.18 (*)    All other components within normal limits  PROTIME-INR - Abnormal; Notable for the following:    Prothrombin Time 20.7 (*)    All other components within normal limits  CBC WITH DIFFERENTIAL/PLATELET - Abnormal; Notable for the following:    WBC 11.4 (*)    Neutro Abs 9.8 (*)    Lymphs Abs 0.6 (*)    All other components within normal limits  I-STAT CG4 LACTIC ACID, ED - Abnormal; Notable for the following:    Lactic Acid, Venous 2.53 (*)    All other components within normal limits  CULTURE, BLOOD (ROUTINE X 2)  CULTURE, BLOOD (ROUTINE X 2)  URINE CULTURE  URINALYSIS, ROUTINE W REFLEX MICROSCOPIC    EKG  EKG Interpretation  Date/Time:  Friday January 21 2017 10:31:09 EDT Ventricular Rate:  82 PR Interval:    QRS Duration: 134 QT Interval:  391 QTC Calculation: 457 R Axis:   85 Text Interpretation:  Atrial-sensed ventricular-paced complexes No further analysis attempted due to paced rhythm Confirmed by Benjiman Core [phone]) on 01/21/2017 10:40:48 AM       Radiology Dg Chest Portable 1 View  Result Date: 01/21/2017 CLINICAL DATA:   Neck pain.  Nonsmoker. EXAM: PORTABLE CHEST 1 VIEW COMPARISON:  04/09/2016; 06/10/2005 FINDINGS: Grossly unchanged cardiac silhouette and mediastinal contours given reduced lung volumes and AP projection. Post median sternotomy and CABG. Stable position of support apparatus. No focal airspace opacities. Minimal left basilar opacities favored to represent atelectasis. No discrete focal airspace opacities. No pleural effusion or pneumothorax. No evidence of edema. No definite acute osseus abnormalities. Several mildly dilated loops of bowel are seen below left hemidiaphragm. IMPRESSION: No definite acute cardiopulmonary disease on this AP portable examination. Further evaluation with a PA  and lateral chest radiograph may be obtained as clinically indicated. Electronically Signed   By: Simonne Come M.D.   On: 01/21/2017 11:21    Procedures Procedures (including critical care time)  Medications Ordered in ED Medications  vancomycin (VANCOCIN) IVPB 1000 mg/200 mL premix (1,000 mg Intravenous New Bag/Given 01/21/17 1151)  vancomycin (VANCOCIN) IVPB 750 mg/150 ml premix (not administered)  sodium chloride 0.9 % bolus 500 mL (500 mLs Intravenous New Bag/Given 01/21/17 1225)  sodium chloride 0.9 % bolus 500 mL (0 mLs Intravenous Stopped 01/21/17 1117)  piperacillin-tazobactam (ZOSYN) IVPB 3.375 g (0 g Intravenous Stopped 01/21/17 1150)     Initial Impression / Assessment and Plan / ED Course  I have reviewed the triage vital signs and the nursing notes.  Pertinent labs & imaging results that were available during my care of the patient were reviewed by me and considered in my medical decision making (see chart for details).   patient presents after episode of hypotension at home. Reportedly has had decreased oral intake over last few days. Afebrile here and had initial hypotension. It is since improved. Has had a 500 mL bolus. Lactic acid only minimally elevated at 2. However creatinine is gone up to 3 with a  BUN of 80. Chest x-ray reassuring. Does have exposed pacemaker wires on his left upper chest and patient is currently paced with previous history of complete heart block. Zosyn and vancomycin empirically given. Will admit to hospitalist with cardiology consult. There is no clear source of infection or even a superinfection at this time. Could be due to the hypotension from decreased oral intake.  CRITICAL CARE Performed by: Billee Cashing Total critical care time: 30 minutes Critical care time was exclusive of separately billable procedures and treating other patients. Critical care was necessary to treat or prevent imminent or life-threatening deterioration. Critical care was time spent personally by me on the following activities: development of treatment plan with patient and/or surrogate as well as nursing, discussions with consultants, evaluation of patient's response to treatment, examination of patient, obtaining history from patient or surrogate, ordering and performing treatments and interventions, ordering and review of laboratory studies, ordering and review of radiographic studies, pulse oximetry and re-evaluation of patient's condition.  I discussed with Dr. Anne Fu from cardiology. He states electrophysiology will see patient when they are transferred.   Final Clinical Impressions(s) / ED Diagnoses   Final diagnoses:  AKI (acute kidney injury) (HCC)  Pacemaker complications, initial encounter  Hypotension, unspecified hypotension type    New Prescriptions New Prescriptions   No medications on file     Benjiman Core, MD 01/21/17 1157    Benjiman Core, MD 01/21/17 1227

## 2017-01-21 NOTE — Progress Notes (Signed)
Pharmacy Antibiotic Note  Patrick Knox is a 76 y.o. male admitted on 01/21/2017 with exposed pacemaker.  Pharmacy has been consulted for vancomycin dosing. Also has zosyn ordered x 1 in the ED. Afeb, wbc 11.4, LA 2.53. AKI - SCr 3.4 on admit, CrCl~20.  Plan: Zosyn 3.375g IV ( infusion) x 1 per EDP Vancomycin 1g IV x 1 given in ED; then  IV q24h Monitor clinical progress, c/s, renal function F/u de-escalation plan/LOT, vancomycin trough as indicated   Height:  (185.4 cm) Weight: 165 lb (74.8 kg) IBW/kg (Calculated) : 79.9  Temp (24hrs), Avg:98.3 F (36.8 C), Min:98.3 F (36.8 C), Max:98.3 F (36.8 C)   Recent Labs Lab 01/21/17 1030 01/21/17 1044  WBC 11.4*  --   LATICACIDVEN  --  2.53*    CrCl cannot be calculated (Patient's most recent lab result is older than the maximum 21 days allowed.).    No Known Allergies  Babs Bertin, PharmD, BCPS Clinical Pharmacist Rx Phone # for today: [phone] After 3:30PM, please call Main Rx: [phone] 01/21/2017 11:02 AM

## 2017-01-21 NOTE — Progress Notes (Signed)
Pharmacy Antibiotic Note  Patrick Knox is a 76 y.o. male admitted on 01/21/2017 with exposed pacemaker.  Pharmacy has been consulted for vancomycin and zosyn dosing.   Temp 99.4, WBC elevated at 11.4, and LA trending down from 2.53 to 1.79. Noted AKI - SCr 3.4 on admit, CrCl~20.  Plan: Zosyn 2.25g IV q8hr  Vancomycin  IV q24h Monitor clinical progress, c/s, renal function Vancomycin trough at [facility] and as needed F/u length of therapy and de-escalation   Height:  (185.4 cm) Weight: 166 lb 10.7 oz (75.6 kg) IBW/kg (Calculated) : 79.9  Temp (24hrs), Avg:98.9 F (37.2 C), Min:98.3 F (36.8 C), Max:99.4 F (37.4 C)   Recent Labs Lab 01/21/17 1030 01/21/17 1044 01/21/17 1405  WBC 11.4*  --   --   CREATININE 3.40*  --   --   LATICACIDVEN  --  2.53* 1.79    Estimated Creatinine Clearance: 20.1 mL/min (A) (by C-G formula based on SCr of 3.4 mg/dL (H)).    No Known Allergies  Antimicrobials: 9/21 Vanc >>  9/21 Zosyn >>   York Cerise, PharmD Clinical Pharmacist 01/21/17 11:02 PM

## 2017-01-22 ENCOUNTER — Encounter (HOSPITAL_COMMUNITY): Payer: Self-pay

## 2017-01-22 ENCOUNTER — Other Ambulatory Visit: Payer: Self-pay

## 2017-01-22 ENCOUNTER — Inpatient Hospital Stay (HOSPITAL_COMMUNITY): Payer: Medicare PPO

## 2017-01-22 DIAGNOSIS — Z7982 Long term (current) use of aspirin: Secondary | ICD-10-CM

## 2017-01-22 DIAGNOSIS — F17210 Nicotine dependence, cigarettes, uncomplicated: Secondary | ICD-10-CM

## 2017-01-22 DIAGNOSIS — T827XXA Infection and inflammatory reaction due to other cardiac and vascular devices, implants and grafts, initial encounter: Principal | ICD-10-CM

## 2017-01-22 DIAGNOSIS — Z951 Presence of aortocoronary bypass graft: Secondary | ICD-10-CM

## 2017-01-22 DIAGNOSIS — I2510 Atherosclerotic heart disease of native coronary artery without angina pectoris: Secondary | ICD-10-CM

## 2017-01-22 DIAGNOSIS — I361 Nonrheumatic tricuspid (valve) insufficiency: Secondary | ICD-10-CM

## 2017-01-22 DIAGNOSIS — R7881 Bacteremia: Secondary | ICD-10-CM

## 2017-01-22 DIAGNOSIS — B9561 Methicillin susceptible Staphylococcus aureus infection as the cause of diseases classified elsewhere: Secondary | ICD-10-CM

## 2017-01-22 LAB — BASIC METABOLIC PANEL
ANION GAP: 13 (ref 5–15)
Anion gap: 11 (ref 5–15)
BUN: 75 mg/dL — ABNORMAL HIGH (ref 6–20)
BUN: 77 mg/dL — AB (ref 6–20)
CALCIUM: 9 mg/dL (ref 8.9–10.3)
CO2: 15 mmol/L — ABNORMAL LOW (ref 22–32)
CO2: 14 mmol/L — AB (ref 22–32)
Calcium: 8.7 mg/dL — ABNORMAL LOW (ref 8.9–10.3)
Chloride: 103 mmol/L (ref 101–111)
Chloride: 107 mmol/L (ref 101–111)
Creatinine, Ser: 2.65 mg/dL — ABNORMAL HIGH (ref 0.61–1.24)
Creatinine, Ser: 2.6 mg/dL — ABNORMAL HIGH (ref 0.61–1.24)
GFR calc Af Amer: 26 mL/min — ABNORMAL LOW (ref >60)
GFR, EST AFRICAN AMERICAN: 26 mL/min — AB (ref >60)
GFR, EST NON AFRICAN AMERICAN: 22 mL/min — AB (ref >60)
GFR, EST NON AFRICAN AMERICAN: 23 mL/min — AB (ref >60)
GLUCOSE: 135 mg/dL — AB (ref 65–99)
Glucose, Bld: 142 mg/dL — ABNORMAL HIGH (ref 65–99)
POTASSIUM: 4.1 mmol/L (ref 3.5–5.1)
Potassium: 3.7 mmol/L (ref 3.5–5.1)
SODIUM: 131 mmol/L — AB (ref 135–145)
Sodium: 132 mmol/L — ABNORMAL LOW (ref 135–145)

## 2017-01-22 LAB — BLOOD CULTURE ID PANEL (REFLEXED)
Acinetobacter baumannii: NOT DETECTED
CANDIDA GLABRATA: NOT DETECTED
CANDIDA KRUSEI: NOT DETECTED
CANDIDA PARAPSILOSIS: NOT DETECTED
Candida albicans: NOT DETECTED
Candida tropicalis: NOT DETECTED
Carbapenem resistance: NOT DETECTED
ESCHERICHIA COLI: NOT DETECTED
Enterobacter cloacae complex: NOT DETECTED
Enterobacteriaceae species: NOT DETECTED
Enterococcus species: NOT DETECTED
Haemophilus influenzae: NOT DETECTED
KLEBSIELLA OXYTOCA: NOT DETECTED
KLEBSIELLA PNEUMONIAE: NOT DETECTED
LISTERIA MONOCYTOGENES: NOT DETECTED
METHICILLIN RESISTANCE: NOT DETECTED
NEISSERIA MENINGITIDIS: NOT DETECTED
PROTEUS SPECIES: NOT DETECTED
Pseudomonas aeruginosa: NOT DETECTED
SERRATIA MARCESCENS: NOT DETECTED
Staphylococcus aureus (BCID): DETECTED — AB
Staphylococcus species: DETECTED — AB
Streptococcus agalactiae: NOT DETECTED
Streptococcus pneumoniae: NOT DETECTED
Streptococcus pyogenes: NOT DETECTED
Streptococcus species: NOT DETECTED
Vancomycin resistance: NOT DETECTED

## 2017-01-22 LAB — ECHOCARDIOGRAM COMPLETE
AOASC: 30 cm
CHL CUP MV DEC (S): 218
CHL CUP PV REG GRAD DIAS: 6 mmHg
CHL CUP REG VEL DIAS: 125 cm/s
CHL CUP RV SYS PRESS: 27 mmHg
CHL CUP TV REG PEAK VELOCITY: 245 cm/s
E decel time: 218 msec
FS: 20 % — AB (ref 28–44)
HEIGHTINCHES: 73 in
IVS/LV PW RATIO, ED: 1.12
LA diam end sys: 35 mm
LA diam index: 1.78 cm/m2
LA vol A4C: 57.8 ml
LASIZE: 35 mm
LAVOL: 53.9 mL
LAVOLIN: 27.4 mL/m2
LV PW d: 10.3 mm — AB (ref 0.6–1.1)
LVOT area: 3.8 cm2
LVOT diameter: 22 mm
MV pk E vel: 57.8 m/s
MVPKAVEL: 81.4 m/s
TAPSE: 17 mm
TRMAXVEL: 245 cm/s
WEIGHTICAEL: 2666.68 [oz_av]

## 2017-01-22 LAB — CBC
HCT: 39.2 % (ref 39.0–52.0)
HEMOGLOBIN: 13.8 g/dL (ref 13.0–17.0)
MCH: 29.1 pg (ref 26.0–34.0)
MCHC: 35.2 g/dL (ref 30.0–36.0)
MCV: 82.7 fL (ref 78.0–100.0)
Platelets: 161 10*3/uL (ref 150–400)
RBC: 4.74 MIL/uL (ref 4.22–5.81)
RDW: 14.9 % (ref 11.5–15.5)
WBC: 11.6 10*3/uL — AB (ref 4.0–10.5)

## 2017-01-22 LAB — BRAIN NATRIURETIC PEPTIDE: B NATRIURETIC PEPTIDE 5: 764.7 pg/mL — AB (ref 0.0–100.0)

## 2017-01-22 LAB — PROTIME-INR
INR: 1.92
PROTHROMBIN TIME: 21.8 s — AB (ref 11.4–15.2)

## 2017-01-22 LAB — GLUCOSE, CAPILLARY: GLUCOSE-CAPILLARY: 161 mg/dL — AB (ref 65–99)

## 2017-01-22 LAB — LIPID PANEL
Cholesterol: 104 mg/dL (ref 0–200)
HDL: 11 mg/dL — AB (ref >40)
LDL CALC: 55 mg/dL (ref 0–99)
TRIGLYCERIDES: 190 mg/dL — AB (ref <150)
Total CHOL/HDL Ratio: 9.5 RATIO
VLDL: 38 mg/dL (ref 0–40)

## 2017-01-22 LAB — HEPARIN LEVEL (UNFRACTIONATED)
HEPARIN UNFRACTIONATED: 0.26 [IU]/mL — AB (ref 0.30–0.70)
Heparin Unfractionated: 0.14 IU/mL — ABNORMAL LOW (ref 0.30–0.70)

## 2017-01-22 LAB — CREATININE, URINE, RANDOM: Creatinine, Urine: 128.2 mg/dL

## 2017-01-22 LAB — HEMOGLOBIN A1C
HEMOGLOBIN A1C: 5.9 % — AB (ref 4.8–5.6)
Mean Plasma Glucose: 122.63 mg/dL

## 2017-01-22 LAB — TROPONIN I
Troponin I: 0.17 ng/mL (ref <0.03)
Troponin I: 0.15 ng/mL (ref <0.03)
Troponin I: 0.13 ng/mL (ref <0.03)

## 2017-01-22 LAB — TSH: TSH: 1.867 u[IU]/mL (ref 0.350–4.500)

## 2017-01-22 LAB — SODIUM, URINE, RANDOM: SODIUM UR: 14 mmol/L

## 2017-01-22 LAB — PROCALCITONIN: PROCALCITONIN: 24.56 ng/mL

## 2017-01-22 LAB — LACTIC ACID, PLASMA: LACTIC ACID, VENOUS: 1.6 mmol/L (ref 0.5–1.9)

## 2017-01-22 MED ORDER — HEPARIN (PORCINE) IN NACL 100-0.45 UNIT/ML-% IJ SOLN
1450.0000 [IU]/h | INTRAMUSCULAR | Status: DC
  Administered 2017-01-22: 1150 [IU]/h via INTRAVENOUS
  Administered 2017-01-22: 900 [IU]/h via INTRAVENOUS
  Administered 2017-01-24: 1450 [IU]/h via INTRAVENOUS
  Filled 2017-01-22 (×3): qty 250

## 2017-01-22 MED ORDER — CEFAZOLIN SODIUM-DEXTROSE 2-4 GM/100ML-% IV SOLN
2.0000 g | Freq: Two times a day (BID) | INTRAVENOUS | Status: DC
  Administered 2017-01-22 – 2017-01-23 (×3): 2 g via INTRAVENOUS
  Filled 2017-01-22 (×3): qty 100

## 2017-01-22 MED ORDER — POLYETHYLENE GLYCOL 3350 17 G PO PACK
17.0000 g | PACK | Freq: Every day | ORAL | Status: DC
  Administered 2017-01-22 – 2017-01-24 (×3): 17 g via ORAL
  Filled 2017-01-22 (×3): qty 1

## 2017-01-22 MED ORDER — SENNOSIDES-DOCUSATE SODIUM 8.6-50 MG PO TABS
1.0000 | ORAL_TABLET | Freq: Two times a day (BID) | ORAL | Status: DC
  Administered 2017-01-22 – 2017-02-05 (×24): 1 via ORAL
  Filled 2017-01-22 (×26): qty 1

## 2017-01-22 NOTE — Progress Notes (Signed)
Pt oriented only to self but able to follow simple commands. He was connected to SCD with no problem.

## 2017-01-22 NOTE — Progress Notes (Signed)
  Echocardiogram 2D Echocardiogram has been performed.  Dorena Dew Fynn Adel 01/22/2017, 12:07 PM

## 2017-01-22 NOTE — Progress Notes (Signed)
Patient stated he feels constipated and would like something to help him have bowel movement. Notified MD. Orders placed and followed. Will continue to monitor.

## 2017-01-22 NOTE — Progress Notes (Signed)
Patients family request patient to have diet order. Notified MD. Orders placed and followed for soft diet. Will continue to monitor.

## 2017-01-22 NOTE — Progress Notes (Signed)
ANTICOAGULATION CONSULT NOTE  Pharmacy Consult for heparin (coumadin on hold) Indication: atrial fibrillation  No Known Allergies  Patient Measurements: Height:  (185.4 cm) Weight: 166 lb 10.7 oz (75.6 kg) IBW/kg (Calculated) : 79.9 Heparin Dosing Weight: 75.6 kg   Vital Signs: Temp: 98.2 F (36.8 C) (09/22 1000) Temp Source: Oral (09/22 1000) BP: 108/63 (09/22 1000) Pulse Rate: 63 (09/22 1000)  Labs:  Recent Labs  01/21/17 1030 01/21/17 1530 01/21/17 2329 01/22/17 0509 01/22/17 0925  HGB 14.3  --   --  13.8  --   HCT 41.0  --   --  39.2  --   PLT 193  --   --  161  --   LABPROT 20.7*  --   --  21.8*  --   INR 1.80  --   --  1.92  --   HEPARINUNFRC  --   --   --   --  0.14*  CREATININE 3.40*  --  2.65* 2.60*  --   TROPONINI 0.18* 0.18* 0.17* 0.15*  --     Estimated Creatinine Clearance: 26.3 mL/min (A) (by C-G formula based on SCr of 2.6 mg/dL (H)).   Medical History: Past Medical History:  Diagnosis Date  . High cholesterol   . Hypertension    Assessment: 76 yo male admitted with weakness and SOB. Patient on warfarin PTA for afib, now on hold. Pharmacy consulted to dose heparin now for elevated troponins + afib. INR 1.92, CBC stable.  Initial heparin level is subtherapeutic at 0.14. No issues with infusion or sxs of bleeding.    Goal of Therapy:  Heparin level 0.3-0.7 units/ml Monitor platelets by anticoagulation protocol: Yes   Plan:  1. Increase heparin gtt to 1150 units/hr 2. Repeat heparin level in 8 hrs 3. Daily heparin level, INR, and CBC 4. Monitor for changes in INR or s/s bleeding   Pollyann Samples, PharmD, BCPS 01/22/2017, 11:01 AM

## 2017-01-22 NOTE — Progress Notes (Signed)
Pharmacy Antibiotic Note Patrick Knox is a 76 y.o. male admitted on 01/21/2017 with MSSA bacteremia and PM pocket infection. Pharmacy has been consulted for Cefazolin dosing.  Plan: 1. Cefazolin 2 grams IV every 12 hours  2. Follow up renal function and empirically adjust antibiotics as needed   Height:  (185.4 cm) Weight: 166 lb 10.7 oz (75.6 kg) IBW/kg (Calculated) : 79.9  Temp (24hrs), Avg:98.7 F (37.1 C), Min:98.2 F (36.8 C), Max:99.4 F (37.4 C)   Recent Labs Lab 01/21/17 1030 01/21/17 1044 01/21/17 1405 01/21/17 2329 01/22/17 0509  WBC 11.4*  --   --   --  11.6*  CREATININE 3.40*  --   --  2.65* 2.60*  LATICACIDVEN  --  2.53* 1.79 1.6  --     Estimated Creatinine Clearance: 26.3 mL/min (A) (by C-G formula based on SCr of 2.6 mg/dL (H)).    No Known Allergies  Antimicrobials this admission: 9/21: MSSA per BCID in 4/4 9/21 UCx: px   Microbiology results: . 9/22 Cefazolin  >>  9/21 Zosyn 9/22 9/21 vancomycin 9/22  Thank you for allowing pharmacy to be a part of this patient's care.  Pollyann Samples, PharmD, BCPS 01/22/2017, 10:53 AM

## 2017-01-22 NOTE — Plan of Care (Signed)
Problem: Education: Goal: Knowledge of [facility] Education information/materials will improve Outcome: Progressing Provided education regarding safety  Problem: Safety: Goal: Ability to remain free from injury will improve Outcome: Progressing Skin factors for fall assessed  Problem: Health Behavior/Discharge Planning: Goal: Ability to manage health-related needs will improve Outcome: Progressing Encouraged compliance with ordered medications  Problem: Pain Managment: Goal: General experience of comfort will improve Outcome: Progressing Assessed for pain   Problem: Physical Regulation: Goal: Ability to maintain clinical measurements within normal limits will improve Outcome: Progressing Monitor vitals and report any results which are deviated from normal Goal: Will remain free from infection Outcome: Progressing Provide infection prevention measures  Problem: Skin Integrity: Goal: Risk for impaired skin integrity will decrease Assessed risk factors for impaired skin integrity

## 2017-01-22 NOTE — Consult Note (Signed)
Cardiology Consultation:   Patient ID: Patrick Knox; [id number]; Oct 29, 1940   Admit date: 01/21/2017 Date of Consult: 01/22/2017  Primary Care Provider: Kirby Funk, MD Primary Cardiologist: Jens Som Primary Electrophysiologist:  Graciela Husbands   Patient Profile:   Patrick Knox is a 76 y.o. male with a hx of complete heart block, s/p BiV PPM who is being seen today for the evaluation of PM pocket infection associated with sepsis with GPC in blood cultures at the request of Izola Price and Clyde Lundborg.  History of Present Illness:   [name] is a 76 yo man with multiple medical problems including CHB, and LV dysfunction s/p BiV PPM insertion who presents with fever and a draining PM pocket. He has been treated with IV anti-biotics and looks better. He has had some altered mental status and his history is difficult to obtain. He has a h/o PAF and was reported by his family to be weak over the past several days. He had a blood pressure in the 80's. He has responded to fluid bolus. He feels better now. Only real complaint to me is feeling weak.  Past Medical History:  Diagnosis Date  . High cholesterol   . Hypertension     Past Surgical History:  Procedure Laterality Date  . CORONARY ARTERY BYPASS GRAFT    . EP IMPLANTABLE DEVICE N/A 02/18/2016   Procedure: BiV Pacemaker Insertion CRT-P;  Surgeon: Duke Salvia, MD;  Location: [facility] INVASIVE CV LAB;  Service: Cardiovascular;  Laterality: N/A;  . HERNIA REPAIR        Inpatient Medications: Scheduled Meds: . aspirin  150 mg Rectal Daily  . nicotine  21 mg Transdermal Daily   Continuous Infusions: . sodium chloride Stopped (01/22/17 0226)  . heparin 900 Units/hr (01/22/17 0247)  . piperacillin-tazobactam (ZOSYN)  IV Stopped (01/22/17 0249)  . vancomycin 750 mg (01/22/17 0812)   PRN Meds: acetaminophen **OR** acetaminophen, hydrALAZINE, morphine injection, ondansetron **OR** ondansetron (ZOFRAN) IV  Allergies:   No Known Allergies  Social  History:   Social History   Social History  . Marital status: Married    Spouse name: N/A  . Number of children: N/A  . Years of education: N/A   Occupational History  . Not on file.   Social History Main Topics  . Smoking status: Current Every Day Smoker    Packs/day: 0.50    Types: Cigarettes  . Smokeless tobacco: Never Used  . Alcohol use No  . Drug use: No  . Sexual activity: Not on file   Other Topics Concern  . Not on file   Social History Narrative  . No narrative on file    Family History:    Family History  Problem Relation Age of Onset  . Hypertension Mother   . Hypertension Father   . Breast cancer Sister      ROS:  Please see the history of present illness.  ROS  All other ROS reviewed and negative.     Physical Exam/Data:   Vitals:   01/21/17 1738 01/21/17 1845 01/21/17 2133 01/22/17 0529  BP: (!) 130/92 (!) 144/79 (!) 163/89 101/65  Pulse: 70 66 88 (!) 57  Resp: Temp:   99.4 F (37.4 C) 98.6 F (37 C)  TempSrc:   Oral Oral  SpO2: 94% 100% 94% 99%  Weight:   166 lb 10.7 oz (75.6 kg)   Height:    (1.854 m)     Intake/Output Summary (Last 24 hours) at  01/22/17 0902 Last data filed at 01/22/17 0600  Gross per 24 hour  Intake           3807.7 ml  Output              680 ml  Net           3127.7 ml   Filed Weights   01/21/17 1009 01/21/17 2133  Weight: 165 lb (74.8 kg) 166 lb 10.7 oz (75.6 kg)   Body mass index is 21.99 kg/m.  General:  Well nourished, well developed, in no acute distress HEENT: normal Lymph: no adenopathy Neck: 6 cm JVD Endocrine:  No thryomegaly Vascular: No carotid bruits; FA pulses 2+ bilaterally without bruits  Cardiac:  normal S1, S2; RRR; no murmur  Lungs:  clear to auscultation bilaterally, no wheezing, rhonchi or rales; draining PM pocket incision with purulent discharge.  Abd: soft, nontender, no hepatomegaly  Ext: no edema Musculoskeletal:  No deformities, BUE and BLE strength normal  and equal Skin: warm and dry  Neuro:  CNs 2-12 intact, no focal abnormalities noted Psych:  Normal affect   EKG:  The EKG was personally reviewed and demonstrates:  p synchronous ventricular pacing Telemetry:  Telemetry was personally reviewed and demonstrates:  P synchronous ventricular pacing  Relevant CV Studies: none  Laboratory Data:  Chemistry Recent Labs Lab 01/21/17 1030 01/21/17 2329 01/22/17 0509  NA 127* 131* 132*  K 4.4 4.1 3.7  CL 97* 103 107  CO2 18* 15* 14*  GLUCOSE 192* 142* 135*  BUN 84* 75* 77*  CREATININE 3.40* 2.65* 2.60*  CALCIUM 9.1 9.0 8.7*  GFRNONAA 16* 22* 23*  GFRAA 19* 26* 26*  ANIONGAP Recent Labs Lab 01/21/17 1030  PROT 8.1  ALBUMIN 3.3*  AST 173*  ALT 83*  ALKPHOS 54  BILITOT 1.5*   Hematology Recent Labs Lab 01/21/17 1030 01/22/17 0509  WBC 11.4* 11.6*  RBC 4.91 4.74  HGB 14.3 13.8  HCT 41.0 39.2  MCV 83.5 82.7  MCH 29.1 29.1  MCHC 34.9 35.2  RDW 14.9 14.9  PLT 193 161   Cardiac Enzymes Recent Labs Lab 01/21/17 1030 01/21/17 1530 01/21/17 2329 01/22/17 0509  TROPONINI 0.18* 0.18* 0.17* 0.15*   No results for input(s): TROPIPOC in the last 168 hours.  BNP Recent Labs Lab 01/21/17 2329  BNP 764.7*    DDimer No results for input(s): DDIMER in the last 168 hours.  Radiology/Studies:  Ct Head Wo Contrast  Result Date: 01/21/2017 CLINICAL DATA:  Altered level consciousness. EXAM: CT HEAD WITHOUT CONTRAST TECHNIQUE: Contiguous axial images were obtained from the base of the skull through the vertex without intravenous contrast. COMPARISON:  None. FINDINGS: Brain: No acute intracranial hemorrhage. No focal mass lesion. No CT evidence of acute infarction. No midline shift or mass effect. No hydrocephalus. Basilar cisterns are patent. Vascular: No hyperdense vessel or unexpected calcification. Skull: Normal. Negative for fracture or focal lesion. Sinuses/Orbits: Paranasal sinuses and mastoid air cells are  clear. Orbits are clear. Other: None. IMPRESSION: No acute intracranial findings. Electronically Signed   By: Genevive Bi M.D.   On: 01/21/2017 23:18   Dg Chest Portable 1 View  Result Date: 01/21/2017 CLINICAL DATA:  Neck pain.  Nonsmoker. EXAM: PORTABLE CHEST 1 VIEW COMPARISON:  04/09/2016; 06/10/2005 FINDINGS: Grossly unchanged cardiac silhouette and mediastinal contours given reduced lung volumes and AP projection. Post median sternotomy and CABG. Stable position of support apparatus. No focal airspace opacities. Minimal  left basilar opacities favored to represent atelectasis. No discrete focal airspace opacities. No pleural effusion or pneumothorax. No evidence of edema. No definite acute osseus abnormalities. Several mildly dilated loops of bowel are seen below left hemidiaphragm. IMPRESSION: No definite acute cardiopulmonary disease on this AP portable examination. Further evaluation with a PA and lateral chest radiograph may be obtained as clinically indicated. Electronically Signed   By: Simonne Come M.D.   On: 01/21/2017 11:21    Assessment and Plan:   1. PM pocket infection - he has improved on IV anti-biotics. He will need his entire system removed and a temporary perm PM in place.  2. GPC bacteremia - He is on anti-biotics. He appears improved.  3. CHB - will check his device to see if he has any escape rhythm.     For questions or updates, please contact [facility] Please consult [email or web address] for contact info under Cardiology/STEMI.   Signed, Lewayne Bunting, MD  01/22/2017 9:02 AM

## 2017-01-22 NOTE — Progress Notes (Signed)
heparin drip started with Charito as a witness.

## 2017-01-22 NOTE — Consult Note (Signed)
[facility]       Reason for Consult: Staph aureus bacteremia    Referring Physician: CHAMP autoconsult  Principal Problem:   Sepsis (HCC) Active Problems:   Hypertension   High cholesterol   Paroxysmal atrial fibrillation (HCC)   Acute renal failure superimposed on stage 3 chronic kidney disease (HCC)   Chronic systolic CHF (congestive heart failure) (HCC)   Hypotension   Acute metabolic encephalopathy   Hyponatremia   Elevated troponin   Tobacco abuse   . aspirin  150 mg Rectal Daily  . nicotine  21 mg Transdermal Daily    Recommendations: Cefazolin Stop zosyn, vancomycin  TEE if TTE unrevealing EP to remove PM Repeat blood cultures after removal of PPM  Assessment: He has a PM pocket infection with associated MSSA bacteremia in 2/2 blood cultures  Antibiotics: Vancomycin and zosyn  HPI: Patrick Knox is a 76 y.o. male with PPM, history of CAD with CABG, tobacco abuse who came in 9/21 with altered mental status, weakness and found to have an elevated lactic acid, WBC and blood cultures now as above in the setting of purulent draining PM pocket incision.  Started on vancomycin and zosyn.  Patient otherwise confused and no other history obtainable.  No complaints voiced.    Review of Systems:  Unable to be assessed due to mental status All other systems reviewed and are negative    Past Medical History:  Diagnosis Date  . High cholesterol   . Hypertension     Social History  Substance Use Topics  . Smoking status: Current Every Day Smoker    Packs/day: 0.50    Types: Cigarettes  . Smokeless tobacco: Never Used  . Alcohol use No    Family History  Problem Relation Age of Onset  . Hypertension Mother   . Hypertension Father   . Breast cancer Sister     No Known Allergies  Physical Exam: Constitutional: in no apparent distress  Vitals:   01/21/17 2133 01/22/17 0529  BP: (!) 163/89 101/65  Pulse: 88 (!) 57  Resp:  19 16  Temp: 99.4 F (37.4 C) 98.6 F (37 C)  SpO2: 94% 99%   EYES: anicteric ENMT: no thrush Cardiovascular: Cor RRR; PM incision with bandage Respiratory: CTA B; normal respiratory effort GI: Bowel sounds are normal, liver is not enlarged, spleen is not enlarged Musculoskeletal: no pedal edema noted Skin: negatives: no rash Hematologic: no cervical lad  Lab Results  Component Value Date   WBC 11.6 (H) 01/22/2017   HGB 13.8 01/22/2017   HCT 39.2 01/22/2017   MCV 82.7 01/22/2017   PLT 161 01/22/2017    Lab Results  Component Value Date   CREATININE 2.60 (H) 01/22/2017   BUN 77 (H) 01/22/2017   NA 132 (L) 01/22/2017   K 3.7 01/22/2017   CL 107 01/22/2017   CO2 14 (L) 01/22/2017    Lab Results  Component Value Date   ALT 83 (H) 01/21/2017   AST 173 (H) 01/21/2017   ALKPHOS 54 01/21/2017     Microbiology: Recent Results (from the past 240 hour(s))  Culture, blood (routine x 2)     Status: None (Preliminary result)   Collection Time: 01/21/17 10:50 AM  Result Value Ref Range Status   Specimen Description BLOOD LEFT FOREARM  Final   Special Requests   Final    BOTTLES DRAWN AEROBIC AND ANAEROBIC Blood Culture adequate volume   Culture  Setup Time  Final    GRAM POSITIVE COCCI IN CLUSTERS IN BOTH AEROBIC AND ANAEROBIC BOTTLES CRITICAL VALUE NOTED.  VALUE IS CONSISTENT WITH PREVIOUSLY REPORTED AND CALLED VALUE. Performed at [facility], [address], [place], [place] [address]    Culture GRAM POSITIVE COCCI IN CLUSTERS  Final   Report Status PENDING  Incomplete  Culture, blood (routine x 2)     Status: None (Preliminary result)   Collection Time: 01/21/17 11:08 AM  Result Value Ref Range Status   Specimen Description BLOOD LEFT WRIST  Final   Special Requests   Final    BOTTLES DRAWN AEROBIC AND ANAEROBIC Blood Culture adequate volume   Culture  Setup Time   Final    GRAM POSITIVE COCCI IN CLUSTERS IN BOTH AEROBIC AND ANAEROBIC BOTTLES CRITICAL  RESULT CALLED TO, READ BACK BY AND VERIFIED WITH: K.COOK PHARMD 01/22/17 0408 L.CHAMPION Performed at [facility], [address] [place], [place] [address]    Culture GRAM POSITIVE COCCI IN CLUSTERS  Final   Report Status PENDING  Incomplete  Blood Culture ID Panel (Reflexed)     Status: Abnormal   Collection Time: 01/21/17 11:08 AM  Result Value Ref Range Status   Enterococcus species NOT DETECTED NOT DETECTED Final   Vancomycin resistance NOT DETECTED NOT DETECTED Final   Listeria monocytogenes NOT DETECTED NOT DETECTED Final   Staphylococcus species DETECTED (A) NOT DETECTED Final    Comment: CRITICAL RESULT CALLED TO, READ BACK BY AND VERIFIED WITH: K.COOK PHARMD 01/22/17 0408 L.CHAMPION    Staphylococcus aureus DETECTED (A) NOT DETECTED Final    Comment: CRITICAL RESULT CALLED TO, READ BACK BY AND VERIFIED WITH: K.COOK PHARMD 01/22/17 0408 L.CHAMPION    Methicillin resistance NOT DETECTED NOT DETECTED Final   Streptococcus species NOT DETECTED NOT DETECTED Final   Streptococcus agalactiae NOT DETECTED NOT DETECTED Final   Streptococcus pneumoniae NOT DETECTED NOT DETECTED Final   Streptococcus pyogenes NOT DETECTED NOT DETECTED Final   Acinetobacter baumannii NOT DETECTED NOT DETECTED Final   Enterobacteriaceae species NOT DETECTED NOT DETECTED Final   Enterobacter cloacae complex NOT DETECTED NOT DETECTED Final   Escherichia coli NOT DETECTED NOT DETECTED Final   Klebsiella oxytoca NOT DETECTED NOT DETECTED Final   Klebsiella pneumoniae NOT DETECTED NOT DETECTED Final   Proteus species NOT DETECTED NOT DETECTED Final   Serratia marcescens NOT DETECTED NOT DETECTED Final   Carbapenem resistance NOT DETECTED NOT DETECTED Final   Haemophilus influenzae NOT DETECTED NOT DETECTED Final   Neisseria meningitidis NOT DETECTED NOT DETECTED Final   Pseudomonas aeruginosa NOT DETECTED NOT DETECTED Final   Candida albicans NOT DETECTED NOT DETECTED Final   Candida glabrata NOT  DETECTED NOT DETECTED Final   Candida krusei NOT DETECTED NOT DETECTED Final   Candida parapsilosis NOT DETECTED NOT DETECTED Final   Candida tropicalis NOT DETECTED NOT DETECTED Final    Comment: Performed at [facility], [address] [place], [place] [address]    Staci Righter, MD [facility] [email or web address] [phone] pager  [phone] cell 01/22/2017, 10:29 AM

## 2017-01-22 NOTE — Progress Notes (Signed)
PHARMACY - PHYSICIAN COMMUNICATION CRITICAL VALUE ALERT - BLOOD CULTURE IDENTIFICATION (BCID)  Results for orders placed or performed during the hospital encounter of 01/21/17  Blood Culture ID Panel (Reflexed) (Collected: 01/21/2017 11:08 AM)  Result Value Ref Range   Enterococcus species NOT DETECTED NOT DETECTED   Vancomycin resistance NOT DETECTED NOT DETECTED   Listeria monocytogenes NOT DETECTED NOT DETECTED   Staphylococcus species DETECTED (A) NOT DETECTED   Staphylococcus aureus DETECTED (A) NOT DETECTED   Methicillin resistance NOT DETECTED NOT DETECTED   Streptococcus species NOT DETECTED NOT DETECTED   Streptococcus agalactiae NOT DETECTED NOT DETECTED   Streptococcus pneumoniae NOT DETECTED NOT DETECTED   Streptococcus pyogenes NOT DETECTED NOT DETECTED   Acinetobacter baumannii NOT DETECTED NOT DETECTED   Enterobacteriaceae species NOT DETECTED NOT DETECTED   Enterobacter cloacae complex NOT DETECTED NOT DETECTED   Escherichia coli NOT DETECTED NOT DETECTED   Klebsiella oxytoca NOT DETECTED NOT DETECTED   Klebsiella pneumoniae NOT DETECTED NOT DETECTED   Proteus species NOT DETECTED NOT DETECTED   Serratia marcescens NOT DETECTED NOT DETECTED   Carbapenem resistance NOT DETECTED NOT DETECTED   Haemophilus influenzae NOT DETECTED NOT DETECTED   Neisseria meningitidis NOT DETECTED NOT DETECTED   Pseudomonas aeruginosa NOT DETECTED NOT DETECTED   Candida albicans NOT DETECTED NOT DETECTED   Candida glabrata NOT DETECTED NOT DETECTED   Candida krusei NOT DETECTED NOT DETECTED   Candida parapsilosis NOT DETECTED NOT DETECTED   Candida tropicalis NOT DETECTED NOT DETECTED    Name of physician (or Provider) Contacted: Dr. Clyde Lundborg   Changes to prescribed antibiotics required: 4/4 bottles with GPCs in clusters. BCID with staph aureus (no methicillin resistance). Patient on broad spectrum abx with Vancomycin and Zosyn. No abx changes per Dr. Clyde Lundborg. Continue to follow cultures until  finalized and de-escalate as indicated.   York Cerise, PharmD Clinical Pharmacist 01/22/17 4:16 AM

## 2017-01-22 NOTE — Progress Notes (Signed)
Pt remains Oriented to self but follows commands. Unable to complete adm questionaires dt AMS. Reported to incoming shift in nad.

## 2017-01-22 NOTE — Progress Notes (Signed)
Lab noticed blood coming from both IV lines. When I arrived in the room, I observed both patients IV lines were filled with blood and both IV lines closest to the IV pole looked like they had been cut. Another nurse and I unhooked both lines and flushed both IV's. IV sites were both clean and patent. I changed both primary lines and hooked the patient back up to his fluids and haven't had another incident. Will continue to monitor.

## 2017-01-22 NOTE — Progress Notes (Addendum)
ANTICOAGULATION CONSULT NOTE - Initial Consult  Pharmacy Consult for heparin (coumadin on hold) Indication: atrial fibrillation  No Known Allergies  Patient Measurements: Height:  (185.4 cm) Weight: 166 lb 10.7 oz (75.6 kg) IBW/kg (Calculated) : 79.9 Heparin Dosing Weight: 75.6 kg   Vital Signs: Temp: 99.4 F (37.4 C) (09/21 2133) Temp Source: Oral (09/21 2133) BP: 163/89 (09/21 2133) Pulse Rate: 88 (09/21 2133)  Labs:  Recent Labs  01/21/17 1030 01/21/17 1530  HGB 14.3  --   HCT 41.0  --   PLT 193  --   LABPROT 20.7*  --   INR 1.80  --   CREATININE 3.40*  --   TROPONINI 0.18* 0.18*    Estimated Creatinine Clearance: 20.1 mL/min (A) (by C-G formula based on SCr of 3.4 mg/dL (H)).   Medical History: Past Medical History:  Diagnosis Date  . High cholesterol   . Hypertension    Assessment: 76 yo male admitted with weakness and SOB. Patient on warfarin PTA for afib, now on hold. Pharmacy consulted to dose heparin now for elevated troponins + afib.   PTA warfarin dose per recent anticoagulation visit is  daily. Last dose of warfarin unknown due to patient altered mental status per RN. INR on 9/21 = 1.8. CBC stable and no s/s bleeding noted.   Due to potential recent warfarin dose, will hold heparin bolus.   Goal of Therapy:  INR 2-3 Monitor platelets by anticoagulation protocol: Yes   Plan:  Start heparin gtt at 900 units/hr Heparin level in 8 hrs Daily heparin level, INR, and CBC Monitor for changes in INR or s/s bleeding   York Cerise, PharmD Clinical Pharmacist 01/22/17 1:04 AM

## 2017-01-22 NOTE — Progress Notes (Addendum)
Patient ID: Patrick Knox, male   DOB: [birth year], 76 y.o.   MRN: [id number]    PROGRESS NOTE  Patrick Knox  WUJ:[id number] DOB: 11/07/1940 DOA: 01/21/2017  PCP: Kirby Funk, MD   Brief Narrative:  Pt is 76 yo male with known HTN, HL, sCHF, EF 35%, a-fib on Coumadin, complete heart block and s/p pacemaker, CAD, s/p CABG, tobacco use, CKD stage III, presented with lethargy and weakness, poor oral intake, progressively worsening confusion.   In ED, pt's blood work notable for WBC 11.4, lactic acid 2.52, elevated trop's, Na 127 and worsening CR up to 3.4 (last known Cr in 04/2016 was WNL). TRH asked to admit for further evaluation.   Assessment & Plan: Sepsis due to Staph Aureus Bacteremia, MSSA (pacemaker pocket infection) - pt was started on Vanc and Zosyn - ID team consulted  - plan to change ABX to Ancef today  - will need TEE if TTE unrevealing - EP to remove PM - pt will need repeat blood cultures after removal of PPM - repeat CBC in AM  Hypotension - due to sepsis - improving but still on low end of normal - continue to monitor   Elevated trop - secondary to sepsis and demand ischemia  - cardiology following   Acute metabolic encephalopathy - secondary to sepsis - appears to be overall improving  - will eventually need PT/OT eval   Hyperlipidemia - holding Crestor until oral intake improves   Paroxysmal atrial fibrillation (HCC): CHA2DS2-VASc Score is 5 - on Coumadin at home but held here due to AMS - currently on heparin drip    Acute kidney injury imposed on CKD stage III  - holding lisinopril that pt takes at home - Cr is trending down overall - BMP in AM  Chronic systolic CHF (congestive heart failure) (HCC) - 2-D echo on 02/18/16 showed EF of 35-40 percent - ECHO requested - daily weights, I/O - cardiology following   Hyponatremia - suspect pre renal etiology - improving in IVF - BMP in AM  Tobacco abuse - allow nicotine patch    Transaminitis - from sepsis most likely - will repeat CMET in AM  DVT prophylaxis: Heparin SQ Code Status: Full  Family Communication: Patient at bedside, no family at bedside  Disposition Plan: to be determined   Consultants:   Cardiology  ID  Procedures:   None  Antimicrobials:   Vancomycin and Zosyn 9/21 --> 9/22  Ancef 9/22 -->  Subjective: Pt reports feeling tired but overall better.   Objective: Vitals:   01/21/17 1845 01/21/17 2133 01/22/17 0529 01/22/17 1000  BP: (!) 144/79 (!) 163/89 101/65 108/63  Pulse: 66 88 (!) 57 63  Resp: Temp:  99.4 F (37.4 C) 98.6 F (37 C) 98.2 F (36.8 C)  TempSrc:  Oral Oral Oral  SpO2: 100% 94% 99% 98%  Weight:  75.6 kg (166 lb 10.7 oz)    Height:   (1.854 m)      Intake/Output Summary (Last 24 hours) at 01/22/17 1439 Last data filed at 01/22/17 1045  Gross per 24 hour  Intake           2557.7 ml  Output              930 ml  Net           1627.7 ml   Filed Weights   01/21/17 1009 01/21/17 2133  Weight: 74.8 kg (165 lb) 75.6 kg (166 lb 10.7  oz)   Examination:  General exam: Appears calm and comfortable  Respiratory system: Clear to auscultation. Respiratory effort normal. Cardiovascular system: RRR. + JVD, no murmurs, rubs, gallops or clicks. No pedal edema. Draining PM pocket incision with purulent discharge  Gastrointestinal system: Abdomen is nondistended, soft and nontender. No organomegaly or masses felt. Normal bowel sounds heard. Central nervous system: Alert. No focal neurological deficits. Extremities: Symmetric 5 x 5 power. Skin: No rashes, lesions or ulcers  Data Reviewed: I have personally reviewed following labs and imaging studies  CBC:  Recent Labs Lab 01/21/17 1030 01/22/17 0509  WBC 11.4* 11.6*  NEUTROABS 9.8*  --   HGB 14.3 13.8  HCT 41.0 39.2  MCV 83.5 82.7  PLT 193 161   Basic Metabolic Panel:  Recent Labs Lab 01/21/17 1030 01/21/17 2329 01/22/17 0509   NA 127* 131* 132*  K 4.4 4.1 3.7  CL 97* 103 107  CO2 18* 15* 14*  GLUCOSE 192* 142* 135*  BUN 84* 75* 77*  CREATININE 3.40* 2.65* 2.60*  CALCIUM 9.1 9.0 8.7*     Recent Labs Lab 01/21/17 1030  AST 173*  ALT 83*  ALKPHOS 54  BILITOT 1.5*  PROT 8.1  ALBUMIN 3.3*   Coagulation Profile:  Recent Labs Lab 01/21/17 1030 01/22/17 0509  INR 1.80 1.92   Cardiac Enzymes:  Recent Labs Lab 01/21/17 1030 01/21/17 1530 01/21/17 2329 01/22/17 0509 01/22/17 0925  TROPONINI 0.18* 0.18* 0.17* 0.15* 0.13*   HbA1C:  Recent Labs  01/22/17 0509  HGBA1C 5.9*   CBG:  Recent Labs Lab 01/21/17 2127 01/22/17 0834  GLUCAP 143* 161*   Lipid Profile:  Recent Labs  01/22/17 0509  CHOL 104  HDL 11*  LDLCALC 55  TRIG 161*  CHOLHDL 9.5   Thyroid Function Tests:  Recent Labs  01/22/17 0509  TSH 1.867   Urine analysis:    Component Value Date/Time   COLORURINE YELLOW 01/21/2017 1215   APPEARANCEUR CLOUDY (A) 01/21/2017 1215   LABSPEC 1.025 01/21/2017 1215   PHURINE 5.5 01/21/2017 1215   GLUCOSEU NEGATIVE 01/21/2017 1215   HGBUR LARGE (A) 01/21/2017 1215   BILIRUBINUR NEGATIVE 01/21/2017 1215   KETONESUR NEGATIVE 01/21/2017 1215   PROTEINUR >300 (A) 01/21/2017 1215   NITRITE NEGATIVE 01/21/2017 1215   LEUKOCYTESUR NEGATIVE 01/21/2017 1215   Recent Results (from the past 240 hour(s))  Culture, blood (routine x 2)     Status: None (Preliminary result)   Collection Time: 01/21/17 10:50 AM  Result Value Ref Range Status   Specimen Description BLOOD LEFT FOREARM  Final   Special Requests   Final    BOTTLES DRAWN AEROBIC AND ANAEROBIC Blood Culture adequate volume   Culture  Setup Time   Final    GRAM POSITIVE COCCI IN CLUSTERS IN BOTH AEROBIC AND ANAEROBIC BOTTLES CRITICAL VALUE NOTED.  VALUE IS CONSISTENT WITH PREVIOUSLY REPORTED AND CALLED VALUE. Performed at [facility], [address] [place], [place] [address]    Culture GRAM POSITIVE COCCI IN  CLUSTERS  Final   Report Status PENDING  Incomplete  Culture, blood (routine x 2)     Status: None (Preliminary result)   Collection Time: 01/21/17 11:08 AM  Result Value Ref Range Status   Specimen Description BLOOD LEFT WRIST  Final   Special Requests   Final    BOTTLES DRAWN AEROBIC AND ANAEROBIC Blood Culture adequate volume   Culture  Setup Time   Final    GRAM POSITIVE COCCI IN CLUSTERS  IN BOTH AEROBIC AND ANAEROBIC BOTTLES CRITICAL RESULT CALLED TO, READ BACK BY AND VERIFIED WITH: K.COOK PHARMD 01/22/17 0408 L.CHAMPION Performed at [facility], [address] [place], [place] [address]    Culture GRAM POSITIVE COCCI IN CLUSTERS  Final   Report Status PENDING  Incomplete  Blood Culture ID Panel (Reflexed)     Status: Abnormal   Collection Time: 01/21/17 11:08 AM  Result Value Ref Range Status   Enterococcus species NOT DETECTED NOT DETECTED Final   Vancomycin resistance NOT DETECTED NOT DETECTED Final   Listeria monocytogenes NOT DETECTED NOT DETECTED Final   Staphylococcus species DETECTED (A) NOT DETECTED Final    Comment: CRITICAL RESULT CALLED TO, READ BACK BY AND VERIFIED WITH: K.COOK PHARMD 01/22/17 0408 L.CHAMPION    Staphylococcus aureus DETECTED (A) NOT DETECTED Final    Comment: CRITICAL RESULT CALLED TO, READ BACK BY AND VERIFIED WITH: K.COOK PHARMD 01/22/17 0408 L.CHAMPION    Methicillin resistance NOT DETECTED NOT DETECTED Final   Streptococcus species NOT DETECTED NOT DETECTED Final   Streptococcus agalactiae NOT DETECTED NOT DETECTED Final   Streptococcus pneumoniae NOT DETECTED NOT DETECTED Final   Streptococcus pyogenes NOT DETECTED NOT DETECTED Final   Acinetobacter baumannii NOT DETECTED NOT DETECTED Final   Enterobacteriaceae species NOT DETECTED NOT DETECTED Final   Enterobacter cloacae complex NOT DETECTED NOT DETECTED Final   Escherichia coli NOT DETECTED NOT DETECTED Final   Klebsiella oxytoca NOT DETECTED NOT DETECTED Final   Klebsiella  pneumoniae NOT DETECTED NOT DETECTED Final   Proteus species NOT DETECTED NOT DETECTED Final   Serratia marcescens NOT DETECTED NOT DETECTED Final   Carbapenem resistance NOT DETECTED NOT DETECTED Final   Haemophilus influenzae NOT DETECTED NOT DETECTED Final   Neisseria meningitidis NOT DETECTED NOT DETECTED Final   Pseudomonas aeruginosa NOT DETECTED NOT DETECTED Final   Candida albicans NOT DETECTED NOT DETECTED Final   Candida glabrata NOT DETECTED NOT DETECTED Final   Candida krusei NOT DETECTED NOT DETECTED Final   Candida parapsilosis NOT DETECTED NOT DETECTED Final   Candida tropicalis NOT DETECTED NOT DETECTED Final    Comment: Performed at [facility], [address], [place], [place] [address]  Urine culture     Status: Abnormal (Preliminary result)   Collection Time: 01/21/17 12:15 PM  Result Value Ref Range Status   Specimen Description URINE, CLEAN CATCH  Final   Special Requests NONE  Final   Culture (A)  Final    70,000 COLONIES/mL STAPHYLOCOCCUS AUREUS SUSCEPTIBILITIES TO FOLLOW Performed at [facility], [address] [place], [place] [address]    Report Status PENDING  Incomplete    Radiology Studies: Ct Head Wo Contrast  Result Date: 01/21/2017 CLINICAL DATA:  Altered level consciousness. EXAM: CT HEAD WITHOUT CONTRAST TECHNIQUE: Contiguous axial images were obtained from the base of the skull through the vertex without intravenous contrast. COMPARISON:  None. FINDINGS: Brain: No acute intracranial hemorrhage. No focal mass lesion. No CT evidence of acute infarction. No midline shift or mass effect. No hydrocephalus. Basilar cisterns are patent. Vascular: No hyperdense vessel or unexpected calcification. Skull: Normal. Negative for fracture or focal lesion. Sinuses/Orbits: Paranasal sinuses and mastoid air cells are clear. Orbits are clear. Other: None. IMPRESSION: No acute intracranial findings. Electronically Signed   By: Genevive Bi M.D.   On:  01/21/2017 23:18   Dg Chest Portable 1 View  Result Date: 01/21/2017 CLINICAL DATA:  Neck pain.  Nonsmoker. EXAM: PORTABLE CHEST 1  VIEW COMPARISON:  04/09/2016; 06/10/2005 FINDINGS: Grossly unchanged cardiac silhouette and mediastinal contours given reduced lung volumes and AP projection. Post median sternotomy and CABG. Stable position of support apparatus. No focal airspace opacities. Minimal left basilar opacities favored to represent atelectasis. No discrete focal airspace opacities. No pleural effusion or pneumothorax. No evidence of edema. No definite acute osseus abnormalities. Several mildly dilated loops of bowel are seen below left hemidiaphragm. IMPRESSION: No definite acute cardiopulmonary disease on this AP portable examination. Further evaluation with a PA and lateral chest radiograph may be obtained as clinically indicated. Electronically Signed   By: Simonne Come M.D.   On: 01/21/2017 11:21   Scheduled Meds: . aspirin  150 mg Rectal Daily  . nicotine  21 mg Transdermal Daily  . polyethylene glycol  17 g Oral Daily  . senna-docusate  1 tablet Oral BID   Continuous Infusions: . sodium chloride Stopped (01/22/17 0226)  .  ceFAZolin (ANCEF) IV 2 g (01/22/17 1215)  . heparin 1,150 Units/hr (01/22/17 1134)     LOS: 1 day   Time spent: 35 minutes   Debbora Presto, MD [facility] Pager [phone]  If 7PM-7AM, please contact night-coverage [email or web address] Password [facility] 01/22/2017, 2:39 PM

## 2017-01-23 DIAGNOSIS — T827XXD Infection and inflammatory reaction due to other cardiac and vascular devices, implants and grafts, subsequent encounter: Secondary | ICD-10-CM

## 2017-01-23 DIAGNOSIS — A4101 Sepsis due to Methicillin susceptible Staphylococcus aureus: Secondary | ICD-10-CM

## 2017-01-23 DIAGNOSIS — Z720 Tobacco use: Secondary | ICD-10-CM

## 2017-01-23 LAB — URINE CULTURE

## 2017-01-23 LAB — BASIC METABOLIC PANEL
Anion gap: 8 (ref 5–15)
BUN: 68 mg/dL — ABNORMAL HIGH (ref 6–20)
CHLORIDE: 107 mmol/L (ref 101–111)
CO2: 21 mmol/L — AB (ref 22–32)
Calcium: 8.8 mg/dL — ABNORMAL LOW (ref 8.9–10.3)
Creatinine, Ser: 2.18 mg/dL — ABNORMAL HIGH (ref 0.61–1.24)
GFR calc non Af Amer: 28 mL/min — ABNORMAL LOW (ref >60)
GFR, EST AFRICAN AMERICAN: 32 mL/min — AB (ref >60)
Glucose, Bld: 142 mg/dL — ABNORMAL HIGH (ref 65–99)
POTASSIUM: 3.8 mmol/L (ref 3.5–5.1)
SODIUM: 136 mmol/L (ref 135–145)

## 2017-01-23 LAB — HEPARIN LEVEL (UNFRACTIONATED)
HEPARIN UNFRACTIONATED: 0.19 [IU]/mL — AB (ref 0.30–0.70)
HEPARIN UNFRACTIONATED: 0.37 [IU]/mL (ref 0.30–0.70)
Heparin Unfractionated: 0.21 IU/mL — ABNORMAL LOW (ref 0.30–0.70)

## 2017-01-23 LAB — CBC
HCT: 39.7 % (ref 39.0–52.0)
Hemoglobin: 13.6 g/dL (ref 13.0–17.0)
MCH: 28.6 pg (ref 26.0–34.0)
MCHC: 34.3 g/dL (ref 30.0–36.0)
MCV: 83.4 fL (ref 78.0–100.0)
PLATELETS: 183 10*3/uL (ref 150–400)
RBC: 4.76 MIL/uL (ref 4.22–5.81)
RDW: 15.1 % (ref 11.5–15.5)
WBC: 11.7 10*3/uL — AB (ref 4.0–10.5)

## 2017-01-23 LAB — GLUCOSE, CAPILLARY: GLUCOSE-CAPILLARY: 126 mg/dL — AB (ref 65–99)

## 2017-01-23 LAB — PROTIME-INR
INR: 2.01
PROTHROMBIN TIME: 22.6 s — AB (ref 11.4–15.2)

## 2017-01-23 MED ORDER — CARVEDILOL 3.125 MG PO TABS
3.1250 mg | ORAL_TABLET | Freq: Two times a day (BID) | ORAL | Status: DC
  Administered 2017-01-23 – 2017-02-05 (×26): 3.125 mg via ORAL
  Filled 2017-01-23 (×28): qty 1

## 2017-01-23 MED ORDER — CEFAZOLIN SODIUM-DEXTROSE 2-4 GM/100ML-% IV SOLN
2.0000 g | Freq: Three times a day (TID) | INTRAVENOUS | Status: DC
  Administered 2017-01-23 – 2017-02-05 (×37): 2 g via INTRAVENOUS
  Filled 2017-01-23 (×44): qty 100

## 2017-01-23 NOTE — Progress Notes (Signed)
ANTICOAGULATION CONSULT NOTE - Follow Up Consult  Pharmacy Consult for heparin while warfarin on hold  Indication: atrial fibrillation   Labs:  Recent Labs  01/21/17 1030  01/21/17 2329 01/22/17 0509  01/22/17 0925 01/22/17 1931 01/23/17 0324 01/23/17 1357  HGB 14.3  --   --  13.8  --   --   --  13.6  --   HCT 41.0  --   --  39.2  --   --   --  39.7  --   PLT 193  --   --  161  --   --   --  183  --   LABPROT 20.7*  --   --  21.8*  --   --   --  22.6*  --   INR 1.80  --   --  1.92  --   --   --  2.01  --   HEPARINUNFRC  --   --   --   --   < > 0.14* 0.26* 0.21* 0.19*  CREATININE 3.40*  --  2.65* 2.60*  --   --   --  2.18*  --   TROPONINI 0.18*  < > 0.17* 0.15*  --  0.13*  --   --   --   < > = values in this interval not displayed.   Assessment: 76yo male remains below goal on heparin gtt despite most recent rate increase; no issues with infusion or sxs of bleeding. INR 2.01 with warfarin on hold and CBC stable. Noted tentative plans for removal of DDD/Biv PM on 9/24.   Goal of Therapy:  Heparin level 0.3-0.7 units/ml Monitor platelets by anticoagulation protocol: Yes  Plan:  1. Increase heparin infusion to 1450 units/hr  2. Repeat heparin level in 8 hours  Pollyann Samples, PharmD, BCPS 01/23/2017, 2:48 PM

## 2017-01-23 NOTE — Progress Notes (Addendum)
ANTICOAGULATION CONSULT NOTE - Follow Up Consult  Pharmacy Consult for heparin while warfarin on hold  Indication: atrial fibrillation/ACS   Labs:  Recent Labs  01/21/17 1030  01/21/17 2329 01/22/17 0509 01/22/17 0925  01/23/17 0324 01/23/17 1357 01/23/17 2305  HGB 14.3  --   --  13.8  --   --  13.6  --   --   HCT 41.0  --   --  39.2  --   --  39.7  --   --   PLT 193  --   --  161  --   --  183  --   --   LABPROT 20.7*  --   --  21.8*  --   --  22.6*  --   --   INR 1.80  --   --  1.92  --   --  2.01  --   --   HEPARINUNFRC  --   --   --   --  0.14*  < > 0.21* 0.19* 0.37  CREATININE 3.40*  --  2.65* 2.60*  --   --  2.18*  --   --   TROPONINI 0.18*  < > 0.17* 0.15* 0.13*  --   --   --   --   < > = values in this interval not displayed.   Assessment: 76yo male on heparin for afib. PTA warfarin on hold.    CBC stable and INR 2.01 on 9/23 with warfarin on hold. No bleeding documented.   Noted tentative plans for removal of DDD/Biv PM on 9/24.   Goal of Therapy:  Heparin level 0.3-0.7 units/ml Monitor platelets by anticoagulation protocol: Yes  Plan:  Continue heparin gtt at 1450 units/hr Confirm heparin level in 8 hrs Daily heparin level, INR, and CBC Monitor for s/s bleeding F/u cardiology plans  York Cerise, PharmD Clinical Pharmacist 01/23/17 11:55 PM

## 2017-01-23 NOTE — Progress Notes (Signed)
Progress Note  Patient Name: Patrick Knox Date of Encounter: 01/23/2017  Primary Cardiologist: Jens Som  Subjective   Some confusion this morning. Fever to 101.8 last night  Inpatient Medications    Scheduled Meds: . carvedilol  3.125 mg Oral BID  . polyethylene glycol  17 g Oral Daily  . senna-docusate  1 tablet Oral BID   Continuous Infusions: .  ceFAZolin (ANCEF) IV 2 g (01/23/17 1007)  . heparin 1,300 Units/hr (01/23/17 0539)   PRN Meds: acetaminophen **OR** acetaminophen, morphine injection, ondansetron **OR** ondansetron (ZOFRAN) IV   Vital Signs    Vitals:   01/22/17 1734 01/22/17 2030 01/23/17 0209 01/23/17 0518  BP: (!) 114/56 125/63  135/79  Pulse: 60 83  72  Resp: Temp: 98 F (36.7 C) (!) 101.4 F (38.6 C)  99 F (37.2 C)  TempSrc: Oral     SpO2: 98% 95%  100%  Weight:  150 lb (68 kg) 149 lb 14.6 oz (68 kg)   Height:        Intake/Output Summary (Last 24 hours) at 01/23/17 1013 Last data filed at 01/23/17 0600  Gross per 24 hour  Intake           1440.2 ml  Output             1000 ml  Net            440.2 ml   Filed Weights   01/21/17 2133 01/22/17 2030 01/23/17 0209  Weight: 166 lb 10.7 oz (75.6 kg) 150 lb (68 kg) 149 lb 14.6 oz (68 kg)    Telemetry    nsr with ventricular pacing - Personally Reviewed  ECG    same - Personally Reviewed  Physical Exam   GEN: No acute distress.   Neck: 7 cm JVD Cardiac: RRR, no murmurs, rubs, or gallops.  Respiratory: Clear to auscultation bilaterally. Left PM device incision is open and leads can be visualized GI: Soft, nontender, non-distended  MS: No edema; No deformity. Neuro:  Nonfocal  Psych: Normal affect   Labs    Chemistry Recent Labs Lab 01/21/17 1030 01/21/17 2329 01/22/17 0509 01/23/17 0324  NA 127* 131* 132* 136  K 4.4 4.1 3.7 3.8  CL 97* 103 107 107  CO2 18* 15* 14* 21*  GLUCOSE 192* 142* 135* 142*  BUN 84* 75* 77* 68*  CREATININE 3.40* 2.65* 2.60* 2.18*    CALCIUM 9.1 9.0 8.7* 8.8*  PROT 8.1  --   --   --   ALBUMIN 3.3*  --   --   --   AST 173*  --   --   --   ALT 83*  --   --   --   ALKPHOS 54  --   --   --   BILITOT 1.5*  --   --   --   GFRNONAA 16* 22* 23* 28*  GFRAA 19* 26* 26* 32*  ANIONGAP Hematology Recent Labs Lab 01/21/17 1030 01/22/17 0509 01/23/17 0324  WBC 11.4* 11.6* 11.7*  RBC 4.91 4.74 4.76  HGB 14.3 13.8 13.6  HCT 41.0 39.2 39.7  MCV 83.5 82.7 83.4  MCH 29.1 29.1 28.6  MCHC 34.9 35.2 34.3  RDW 14.9 14.9 15.1  PLT 193 161 183    Cardiac Enzymes Recent Labs Lab 01/21/17 1530 01/21/17 2329 01/22/17 0509 01/22/17 0925  TROPONINI 0.18* 0.17* 0.15* 0.13*   No results for  input(s): TROPIPOC in the last 168 hours.   BNP Recent Labs Lab 01/21/17 2329  BNP 764.7*     DDimer No results for input(s): DDIMER in the last 168 hours.   Radiology    Ct Head Wo Contrast  Result Date: 01/21/2017 CLINICAL DATA:  Altered level consciousness. EXAM: CT HEAD WITHOUT CONTRAST TECHNIQUE: Contiguous axial images were obtained from the base of the skull through the vertex without intravenous contrast. COMPARISON:  None. FINDINGS: Brain: No acute intracranial hemorrhage. No focal mass lesion. No CT evidence of acute infarction. No midline shift or mass effect. No hydrocephalus. Basilar cisterns are patent. Vascular: No hyperdense vessel or unexpected calcification. Skull: Normal. Negative for fracture or focal lesion. Sinuses/Orbits: Paranasal sinuses and mastoid air cells are clear. Orbits are clear. Other: None. IMPRESSION: No acute intracranial findings. Electronically Signed   By: Genevive Bi M.D.   On: 01/21/2017 23:18   Dg Chest Portable 1 View  Result Date: 01/21/2017 CLINICAL DATA:  Neck pain.  Nonsmoker. EXAM: PORTABLE CHEST 1 VIEW COMPARISON:  04/09/2016; 06/10/2005 FINDINGS: Grossly unchanged cardiac silhouette and mediastinal contours given reduced lung volumes and AP projection. Post median  sternotomy and CABG. Stable position of support apparatus. No focal airspace opacities. Minimal left basilar opacities favored to represent atelectasis. No discrete focal airspace opacities. No pleural effusion or pneumothorax. No evidence of edema. No definite acute osseus abnormalities. Several mildly dilated loops of bowel are seen below left hemidiaphragm. IMPRESSION: No definite acute cardiopulmonary disease on this AP portable examination. Further evaluation with a PA and lateral chest radiograph may be obtained as clinically indicated. Electronically Signed   By: Simonne Come M.D.   On: 01/21/2017 11:21    Cardiac Studies   Blood cultures now growing out Staph  Patient Profile     76 y.o. male admitted with altered mental status, growing out staph aureus now, with drainage from PM pocket. He was hypotensive and this has resolved. His renal function has improved.  Assessment & Plan    1. Staph bacteremia with PM pocket infection - he is improved clinically with IV anti-biotics. Will plan on removing DDD/Biv PM, hopefully tomorrow.  Consider ID consult to see if he needs a TEE. 2. CHB - We will assess his escape rhythm tomorrow. I suspect he will need a temporary PM. 3. Chronic systolic heart failure - he appears to be well compensated.  For questions or updates, please contact [facility] Please consult [email or web address] for contact info under Cardiology/STEMI.      Signed, Lewayne Bunting, MD  01/23/2017, 10:13 AM  Patient ID: Patrick Knox, male   DOB: 25-Sep-1940, 76 y.o.   MRN: [id number]

## 2017-01-23 NOTE — Progress Notes (Signed)
OT Cancellation Note  Patient Details Name: Matisse Salais MRN: [id number] DOB: 03-28-1941   Cancelled Treatment:    Reason Eval/Treat Not Completed: Medical issues which prohibited therapy. After speaking with RN, Pt is not within protocol for therapeutic intervention until heparin has been in system for 24 hours. Due to Pt being found with cut/chewed IV lines, will hold Therapy until tomorrow.   Evern Bio Izekiel Flegel 01/23/2017, 2:07 PM  Sherryl Manges OTR/L [phone]

## 2017-01-23 NOTE — Progress Notes (Signed)
Pharmacy Antibiotic Note Patrick Knox is a 76 y.o. male admitted on 01/21/2017 with MSSA bacteremia and PM pocket infection. Pharmacy following for cefazolin dosing.   AKI appears to be resolving with improvement in SCr and UOP.   Plan: 1. Adjust Cefazolin to 2 grams IV every 8 hours  2. Follow up renal function in am and make further adjustments as needed   Height:  (185.4 cm) Weight: 149 lb 14.6 oz (68 kg) IBW/kg (Calculated) : 79.9  Temp (24hrs), Avg:99.5 F (37.5 C), Min:98 F (36.7 C), Max:101.4 F (38.6 C)   Recent Labs Lab 01/21/17 1030 01/21/17 1044 01/21/17 1405 01/21/17 2329 01/22/17 0509 01/23/17 0324  WBC 11.4*  --   --   --  11.6* 11.7*  CREATININE 3.40*  --   --  2.65* 2.60* 2.18*  LATICACIDVEN  --  2.53* 1.79 1.6  --   --     Estimated Creatinine Clearance: 28.2 mL/min (A) (by C-G formula based on SCr of 2.18 mg/dL (H)).    No Known Allergies  Antimicrobials this admission: 9/21: BCx: MSSA per BCID in 4/4 9/21 UCx: MSSA   Microbiology results: . 9/22 Cefazolin  >>  9/21 Zosyn 9/22 9/21 vancomycin 9/22  Thank you for allowing pharmacy to be a part of this patient's care.  Pollyann Samples, PharmD, BCPS 01/23/2017, 1:31 PM

## 2017-01-23 NOTE — Progress Notes (Signed)
ANTICOAGULATION CONSULT NOTE - Follow Up Consult  Pharmacy Consult for heparin Indication: atrial fibrillation  Labs:  Recent Labs  01/21/17 1030  01/21/17 2329 01/22/17 0509 01/22/17 0925 01/22/17 1931 01/23/17 0324  HGB 14.3  --   --  13.8  --   --  13.6  HCT 41.0  --   --  39.2  --   --  39.7  PLT 193  --   --  161  --   --  PENDING  LABPROT 20.7*  --   --  21.8*  --   --  22.6*  INR 1.80  --   --  1.92  --   --  2.01  HEPARINUNFRC  --   --   --   --  0.14* 0.26* 0.21*  CREATININE 3.40*  --  2.65* 2.60*  --   --  2.18*  TROPONINI 0.18*  < > 0.17* 0.15* 0.13*  --   --   < > = values in this interval not displayed.   Assessment: 76yo male remains below goal on heparin; RNs last pm found IV lines cut or chewed, has been running fine since lines were replaced.  Goal of Therapy:  Heparin level 0.3-0.7 units/ml   Plan:  Will increase heparin gtt by 2 units/kg/hr to 1300 units/hr and check level in 8hr.  Vernard Gambles, PharmD, BCPS  01/23/2017,5:39 AM

## 2017-01-23 NOTE — Progress Notes (Signed)
PT NOTE:  Reason Eval/Treat Not Completed: Medical issues which prohibited therapy. After speaking with RN, Pt is not within protocol for therapeutic intervention until heparin has been in system for 24 hours. Due to Pt being found with cut/chewed IV lines, will hold Therapy until tomorrow.   Clydie Braun L. Katrinka Blazing, [place] Pager [phone] 01/23/2017

## 2017-01-23 NOTE — Progress Notes (Addendum)
PROGRESS NOTE    Patrick Knox   ZOX:[id number]  DOB: Mar 24, 1941  DOA: 01/21/2017 PCP: Kirby Funk, MD   Brief Narrative:  Patrick Knox 76 y.o. male with PPM, history of CAD with CABG, HTN, sCHF, A-fib on Coumadin, complete heart block with pacemaker, CKD 3, tobacco abuse presents from home with confusion and weakness. Apparently also started Flexeril recently. Found to have BP 86/46, sodium 127, exposed pacer wires and and pus coming out of pacemaker pocket. Blood cultures 2/2 for MSSA.   Subjective: Confused- has no complaints.  ROS: no complaints of nausea, vomiting, constipation diarrhea, cough, dyspnea or dysuria. No other complaints.   Assessment & Plan:   Principal Problem:   Sepsis, MSSA - continues to have fevers and is encephalopathic - related to pacer infection - cont ancef - Pacer to be removed tomorrow - ID to evaluate- may need TEE  Active Problems:   Acute metabolic encephalopathy - likely due to infection- follow    Paroxysmal atrial fibrillation (HCC) - resume low dose of Coreg for now to prevent RVR - Heparin    Hypertension - Coreg- lower than home dose for now- holding Lisinopril    Acute renal failure superimposed on stage 3 chronic kidney disease (HCC) - baseline Cr 1.1 - admitted with Cr 3.4- likely prerenal and ATN in setting of ACE I and sepsis - steadily improving - d/c IVF today - appears to be eating/drinking well and had good urine output  Metabolic acidosis - improving- likely from AKI and sepsis    Chronic systolic CHF (congestive heart failure) (HCC) - ECHO this admission: EF 35-40% with focal hypokinesis, grade 1 dCHF - stop IVF today- follow daily weights - not on chronic diuretics    Hypotension/ hyponatremia - resolved with fluid replacement  Mildly elevated troponin - likely due to sepsis/ hypotension    Tobacco abuse - counseling - states he quit smoking about 2 wks prior to admission- d/c patch  DVT prophylaxis:  Heparin Code Status: Full code Family Communication:  Disposition Plan: to be determined Consultants:   Cardiology  ID Procedures:  2 D ECHO Study Conclusions  - Left ventricle: The cavity size was mildly dilated. Systolic   function was moderately reduced. The estimated ejection fraction   was in the range of 35% to 40%. Diffuse hypokinesis. Dyskinesis   of the basal inferolateral myocardium. Akinesis of the basal   inferior myocardium. Doppler parameters are consistent with   abnormal left ventricular relaxation (grade 1 diastolic   dysfunction). - Aortic valve: Transvalvular velocity was within the normal range.   There was no stenosis. There was no regurgitation. - Mitral valve: Transvalvular velocity was within the normal range.   There was no evidence for stenosis. There was trivial   regurgitation. - Right ventricle: The cavity size was normal. Wall thickness was   normal. Systolic function was mildly reduced. - Tricuspid valve: There was mild regurgitation. - Pulmonary arteries: Systolic pressure was within the normal   range. PA peak pressure: 27 mm Hg (S).  Antimicrobials:  Anti-infectives    Start     Dose/Rate Route Frequency Ordered Stop   01/23/17 1600  ceFAZolin (ANCEF) IVPB 2g/100 mL premix     2 g 200 mL/hr over 30 Minutes Intravenous Every 8 hours 01/23/17 1331     01/22/17 1100  ceFAZolin (ANCEF) IVPB 2g/100 mL premix  Status:  Discontinued     2 g 200 mL/hr over 30 Minutes Intravenous Every 12 hours 01/22/17 1046 01/23/17  1331   01/22/17 0500  vancomycin (VANCOCIN) IVPB 750 mg/150 ml premix  Status:  Discontinued     750 mg 150 mL/hr over 60 Minutes Intravenous Every 24 hours 01/21/17 1107 01/22/17 1030   01/22/17 0000  piperacillin-tazobactam (ZOSYN) IVPB 2.25 g  Status:  Discontinued     2.25 g 100 mL/hr over 30 Minutes Intravenous Every 8 hours 01/21/17 2305 01/22/17 1030   01/21/17 1115  vancomycin (VANCOCIN) IVPB 1000 mg/200 mL premix      1,000 mg 200 mL/hr over 60 Minutes Intravenous  Once 01/21/17 1104 01/21/17 1257   01/21/17 1100  piperacillin-tazobactam (ZOSYN) IVPB 3.375 g     3.375 g 100 mL/hr over 30 Minutes Intravenous  Once 01/21/17 1051 01/21/17 1150       Objective: Vitals:   01/22/17 2030 01/23/17 0209 01/23/17 0518 01/23/17 1030  BP: 125/63  135/79 128/78  Pulse: 83  72 66  Resp: 16  17   Temp: (!) 101.4 F (38.6 C)  99 F (37.2 C)   TempSrc:      SpO2: 95%  100%   Weight: 68 kg (150 lb) 68 kg (149 lb 14.6 oz)    Height:        Intake/Output Summary (Last 24 hours) at 01/23/17 1507 Last data filed at 01/23/17 1259  Gross per 24 hour  Intake           1680.2 ml  Output              925 ml  Net            755.2 ml   Filed Weights   01/21/17 2133 01/22/17 2030 01/23/17 0209  Weight: 75.6 kg (166 lb 10.7 oz) 68 kg (150 lb) 68 kg (149 lb 14.6 oz)    Examination: General exam: Appears comfortable  HEENT: PERRLA, oral mucosa moist, no sclera icterus or thrush Respiratory system: Clear to auscultation. Respiratory effort normal. Cardiovascular system: S1 & S2 heard, RRR.  No murmurs  Gastrointestinal system: Abdomen soft, non-tender, nondistended. Normal bowel sound. No organomegaly Central nervous system: Alert and disoriented. No focal neurological deficits. Extremities: No cyanosis, clubbing or edema Skin:   ulcer noted above pacemaker Psychiatry:  Mood & affect appropriate.     Data Reviewed: I have personally reviewed following labs and imaging studies  CBC:  Recent Labs Lab 01/21/17 1030 01/22/17 0509 01/23/17 0324  WBC 11.4* 11.6* 11.7*  NEUTROABS 9.8*  --   --   HGB 14.3 13.8 13.6  HCT 41.0 39.2 39.7  MCV 83.5 82.7 83.4  PLT 193 161 183   Basic Metabolic Panel:  Recent Labs Lab 01/21/17 1030 01/21/17 2329 01/22/17 0509 01/23/17 0324  NA 127* 131* 132* 136  K 4.4 4.1 3.7 3.8  CL 97* 103 107 107  CO2 18* 15* 14* 21*  GLUCOSE 192* 142* 135* 142*  BUN 84* 75* 77*  68*  CREATININE 3.40* 2.65* 2.60* 2.18*  CALCIUM 9.1 9.0 8.7* 8.8*   GFR: Estimated Creatinine Clearance: 28.2 mL/min (A) (by C-G formula based on SCr of 2.18 mg/dL (H)). Liver Function Tests:  Recent Labs Lab 01/21/17 1030  AST 173*  ALT 83*  ALKPHOS 54  BILITOT 1.5*  PROT 8.1  ALBUMIN 3.3*   No results for input(s): LIPASE, AMYLASE in the last 168 hours. No results for input(s): AMMONIA in the last 168 hours. Coagulation Profile:  Recent Labs Lab 01/21/17 1030 01/22/17 0509 01/23/17 0324  INR 1.80 1.92 2.01  Cardiac Enzymes:  Recent Labs Lab 01/21/17 1030 01/21/17 1530 01/21/17 2329 01/22/17 0509 01/22/17 0925  TROPONINI 0.18* 0.18* 0.17* 0.15* 0.13*   BNP (last 3 results) No results for input(s): PROBNP in the last 8760 hours. HbA1C:  Recent Labs  01/22/17 0509  HGBA1C 5.9*   CBG:  Recent Labs Lab 01/21/17 2127 01/22/17 0834 01/23/17 0738  GLUCAP 143* 161* 126*   Lipid Profile:  Recent Labs  01/22/17 0509  CHOL 104  HDL 11*  LDLCALC 55  TRIG 657*  CHOLHDL 9.5   Thyroid Function Tests:  Recent Labs  01/22/17 0509  TSH 1.867   Anemia Panel: No results for input(s): VITAMINB12, FOLATE, FERRITIN, TIBC, IRON, RETICCTPCT in the last 72 hours. Urine analysis:    Component Value Date/Time   COLORURINE YELLOW 01/21/2017 1215   APPEARANCEUR CLOUDY (A) 01/21/2017 1215   LABSPEC 1.025 01/21/2017 1215   PHURINE 5.5 01/21/2017 1215   GLUCOSEU NEGATIVE 01/21/2017 1215   HGBUR LARGE (A) 01/21/2017 1215   BILIRUBINUR NEGATIVE 01/21/2017 1215   KETONESUR NEGATIVE 01/21/2017 1215   PROTEINUR >300 (A) 01/21/2017 1215   NITRITE NEGATIVE 01/21/2017 1215   LEUKOCYTESUR NEGATIVE 01/21/2017 1215   Sepsis Labs: (procalcitonin:4,lacticidven:4) ) Recent Results (from the past 240 hour(s))  Culture, blood (routine x 2)     Status: Abnormal (Preliminary result)   Collection Time: 01/21/17 10:50 AM  Result Value Ref Range Status    Specimen Description BLOOD LEFT FOREARM  Final   Special Requests   Final    BOTTLES DRAWN AEROBIC AND ANAEROBIC Blood Culture adequate volume   Culture  Setup Time   Final    GRAM POSITIVE COCCI IN CLUSTERS IN BOTH AEROBIC AND ANAEROBIC BOTTLES CRITICAL VALUE NOTED.  VALUE IS CONSISTENT WITH PREVIOUSLY REPORTED AND CALLED VALUE. Performed at [facility], [address], [place], [place] [address]    Culture STAPHYLOCOCCUS AUREUS (A)  Final   Report Status PENDING  Incomplete  Culture, blood (routine x 2)     Status: Abnormal (Preliminary result)   Collection Time: 01/21/17 11:08 AM  Result Value Ref Range Status   Specimen Description BLOOD LEFT WRIST  Final   Special Requests   Final    BOTTLES DRAWN AEROBIC AND ANAEROBIC Blood Culture adequate volume   Culture  Setup Time   Final    GRAM POSITIVE COCCI IN CLUSTERS IN BOTH AEROBIC AND ANAEROBIC BOTTLES CRITICAL RESULT CALLED TO, READ BACK BY AND VERIFIED WITH: K.COOK PHARMD 01/22/17 0408 L.CHAMPION    Culture (A)  Final    STAPHYLOCOCCUS AUREUS SUSCEPTIBILITIES TO FOLLOW Performed at [facility], [address] [place], [place] [address]    Report Status PENDING  Incomplete  Blood Culture ID Panel (Reflexed)     Status: Abnormal   Collection Time: 01/21/17 11:08 AM  Result Value Ref Range Status   Enterococcus species NOT DETECTED NOT DETECTED Final   Vancomycin resistance NOT DETECTED NOT DETECTED Final   Listeria monocytogenes NOT DETECTED NOT DETECTED Final   Staphylococcus species DETECTED (A) NOT DETECTED Final    Comment: CRITICAL RESULT CALLED TO, READ BACK BY AND VERIFIED WITH: K.COOK PHARMD 01/22/17 0408 L.CHAMPION    Staphylococcus aureus DETECTED (A) NOT DETECTED Final    Comment: CRITICAL RESULT CALLED TO, READ BACK BY AND VERIFIED WITH: K.COOK PHARMD 01/22/17 0408 L.CHAMPION    Methicillin resistance NOT DETECTED NOT DETECTED Final   Streptococcus species NOT DETECTED NOT DETECTED Final    Streptococcus agalactiae NOT DETECTED  NOT DETECTED Final   Streptococcus pneumoniae NOT DETECTED NOT DETECTED Final   Streptococcus pyogenes NOT DETECTED NOT DETECTED Final   Acinetobacter baumannii NOT DETECTED NOT DETECTED Final   Enterobacteriaceae species NOT DETECTED NOT DETECTED Final   Enterobacter cloacae complex NOT DETECTED NOT DETECTED Final   Escherichia coli NOT DETECTED NOT DETECTED Final   Klebsiella oxytoca NOT DETECTED NOT DETECTED Final   Klebsiella pneumoniae NOT DETECTED NOT DETECTED Final   Proteus species NOT DETECTED NOT DETECTED Final   Serratia marcescens NOT DETECTED NOT DETECTED Final   Carbapenem resistance NOT DETECTED NOT DETECTED Final   Haemophilus influenzae NOT DETECTED NOT DETECTED Final   Neisseria meningitidis NOT DETECTED NOT DETECTED Final   Pseudomonas aeruginosa NOT DETECTED NOT DETECTED Final   Candida albicans NOT DETECTED NOT DETECTED Final   Candida glabrata NOT DETECTED NOT DETECTED Final   Candida krusei NOT DETECTED NOT DETECTED Final   Candida parapsilosis NOT DETECTED NOT DETECTED Final   Candida tropicalis NOT DETECTED NOT DETECTED Final    Comment: Performed at [facility], [address], [place], [place] [address]  Urine culture     Status: Abnormal   Collection Time: 01/21/17 12:15 PM  Result Value Ref Range Status   Specimen Description URINE, CLEAN CATCH  Final   Special Requests NONE  Final   Culture 70,000 COLONIES/mL STAPHYLOCOCCUS AUREUS (A)  Final   Report Status 01/23/2017 FINAL  Final   Organism ID, Bacteria STAPHYLOCOCCUS AUREUS (A)  Final      Susceptibility   Staphylococcus aureus - MIC*    CIPROFLOXACIN <=0.5 SENSITIVE Sensitive     GENTAMICIN <=0.5 SENSITIVE Sensitive     NITROFURANTOIN 32 SENSITIVE Sensitive     OXACILLIN <=0.25 SENSITIVE Sensitive     TETRACYCLINE <=1 SENSITIVE Sensitive     VANCOMYCIN <=0.5 SENSITIVE Sensitive     TRIMETH/SULFA <=10 SENSITIVE Sensitive     CLINDAMYCIN <=0.25 SENSITIVE  Sensitive     RIFAMPIN <=0.5 SENSITIVE Sensitive     Inducible Clindamycin NEGATIVE Sensitive     * 70,000 COLONIES/mL STAPHYLOCOCCUS AUREUS         Radiology Studies: Ct Head Wo Contrast  Result Date: 01/21/2017 CLINICAL DATA:  Altered level consciousness. EXAM: CT HEAD WITHOUT CONTRAST TECHNIQUE: Contiguous axial images were obtained from the base of the skull through the vertex without intravenous contrast. COMPARISON:  None. FINDINGS: Brain: No acute intracranial hemorrhage. No focal mass lesion. No CT evidence of acute infarction. No midline shift or mass effect. No hydrocephalus. Basilar cisterns are patent. Vascular: No hyperdense vessel or unexpected calcification. Skull: Normal. Negative for fracture or focal lesion. Sinuses/Orbits: Paranasal sinuses and mastoid air cells are clear. Orbits are clear. Other: None. IMPRESSION: No acute intracranial findings. Electronically Signed   By: Genevive Bi M.D.   On: 01/21/2017 23:18      Scheduled Meds: . carvedilol  3.125 mg Oral BID  . polyethylene glycol  17 g Oral Daily  . senna-docusate  1 tablet Oral BID   Continuous Infusions: .  ceFAZolin (ANCEF) IV 2 g (01/23/17 1506)  . heparin 1,450 Units/hr (01/23/17 1445)     LOS: 2 days    Time spent in minutes: 35    Calvert Cantor, MD [facility] Pager: [email or web address] Password [facility] 01/23/2017, 3:07 PM

## 2017-01-24 ENCOUNTER — Encounter (HOSPITAL_COMMUNITY)
Admission: EM | Disposition: A | Discharge status: Skilled Nursing Facility | Payer: Self-pay | Source: Home / Self Care | Attending: Family Medicine

## 2017-01-24 ENCOUNTER — Inpatient Hospital Stay (HOSPITAL_COMMUNITY): Payer: Medicare PPO

## 2017-01-24 ENCOUNTER — Encounter (HOSPITAL_COMMUNITY): Payer: Self-pay | Admitting: *Deleted

## 2017-01-24 DIAGNOSIS — I480 Paroxysmal atrial fibrillation: Secondary | ICD-10-CM

## 2017-01-24 DIAGNOSIS — N179 Acute kidney failure, unspecified: Secondary | ICD-10-CM

## 2017-01-24 DIAGNOSIS — N183 Chronic kidney disease, stage 3 (moderate): Secondary | ICD-10-CM

## 2017-01-24 DIAGNOSIS — E871 Hypo-osmolality and hyponatremia: Secondary | ICD-10-CM

## 2017-01-24 DIAGNOSIS — I442 Atrioventricular block, complete: Secondary | ICD-10-CM

## 2017-01-24 DIAGNOSIS — I5022 Chronic systolic (congestive) heart failure: Secondary | ICD-10-CM

## 2017-01-24 DIAGNOSIS — Y838 Other surgical procedures as the cause of abnormal reaction of the patient, or of later complication, without mention of misadventure at the time of the procedure: Secondary | ICD-10-CM

## 2017-01-24 DIAGNOSIS — I10 Essential (primary) hypertension: Secondary | ICD-10-CM

## 2017-01-24 DIAGNOSIS — A4901 Methicillin susceptible Staphylococcus aureus infection, unspecified site: Secondary | ICD-10-CM

## 2017-01-24 DIAGNOSIS — E7800 Pure hypercholesterolemia, unspecified: Secondary | ICD-10-CM

## 2017-01-24 LAB — CBC
HCT: 37.9 % — ABNORMAL LOW (ref 39.0–52.0)
Hemoglobin: 13.3 g/dL (ref 13.0–17.0)
MCH: 28.4 pg (ref 26.0–34.0)
MCHC: 35.1 g/dL (ref 30.0–36.0)
MCV: 81 fL (ref 78.0–100.0)
PLATELETS: 160 10*3/uL (ref 150–400)
RBC: 4.68 MIL/uL (ref 4.22–5.81)
RDW: 15.5 % (ref 11.5–15.5)
WBC: 15.1 10*3/uL — ABNORMAL HIGH (ref 4.0–10.5)

## 2017-01-24 LAB — COMPREHENSIVE METABOLIC PANEL
ALT: 29 U/L (ref 17–63)
ANION GAP: 8 (ref 5–15)
AST: 78 U/L — ABNORMAL HIGH (ref 15–41)
Albumin: 2 g/dL — ABNORMAL LOW (ref 3.5–5.0)
Alkaline Phosphatase: 47 U/L (ref 38–126)
BUN: 61 mg/dL — ABNORMAL HIGH (ref 6–20)
CHLORIDE: 109 mmol/L (ref 101–111)
CO2: 16 mmol/L — AB (ref 22–32)
CREATININE: 1.88 mg/dL — AB (ref 0.61–1.24)
Calcium: 8.4 mg/dL — ABNORMAL LOW (ref 8.9–10.3)
GFR, EST AFRICAN AMERICAN: 39 mL/min — AB (ref >60)
GFR, EST NON AFRICAN AMERICAN: 33 mL/min — AB (ref >60)
Glucose, Bld: 149 mg/dL — ABNORMAL HIGH (ref 65–99)
POTASSIUM: 4.1 mmol/L (ref 3.5–5.1)
SODIUM: 133 mmol/L — AB (ref 135–145)
Total Bilirubin: 0.9 mg/dL (ref 0.3–1.2)
Total Protein: 5.8 g/dL — ABNORMAL LOW (ref 6.5–8.1)

## 2017-01-24 LAB — SURGICAL PCR SCREEN
MRSA, PCR: NEGATIVE
STAPHYLOCOCCUS AUREUS: POSITIVE — AB

## 2017-01-24 LAB — CULTURE, BLOOD (ROUTINE X 2)
SPECIAL REQUESTS: ADEQUATE
Special Requests: ADEQUATE

## 2017-01-24 LAB — PROTIME-INR
INR: 2.02
Prothrombin Time: 22.7 seconds — ABNORMAL HIGH (ref 11.4–15.2)

## 2017-01-24 LAB — GLUCOSE, CAPILLARY: Glucose-Capillary: 156 mg/dL — ABNORMAL HIGH (ref 65–99)

## 2017-01-24 LAB — HEPARIN LEVEL (UNFRACTIONATED): HEPARIN UNFRACTIONATED: 0.34 [IU]/mL (ref 0.30–0.70)

## 2017-01-24 SURGERY — CANCELLED PROCEDURE

## 2017-01-24 SURGERY — PACEMAKER IMPLANT

## 2017-01-24 SURGERY — PPM GENERATOR REMOVAL

## 2017-01-24 MED ORDER — SODIUM CHLORIDE 0.9 % IR SOLN: 80.0000 mg | Status: DC

## 2017-01-24 MED ORDER — SODIUM CHLORIDE 0.9 % IV SOLN: INTRAVENOUS | Status: DC

## 2017-01-24 MED ORDER — FENTANYL CITRATE (PF) 100 MCG/2ML IJ SOLN
INTRAMUSCULAR | Status: AC
  Filled 2017-01-24: qty 2

## 2017-01-24 MED ORDER — MIDAZOLAM HCL 5 MG/ML IJ SOLN
INTRAMUSCULAR | Status: AC
Start: 2017-01-24 — End: 2017-01-24
  Filled 2017-01-24: qty 1

## 2017-01-24 MED ORDER — CHLORHEXIDINE GLUCONATE 4 % EX LIQD
60.0000 mL | Freq: Once | CUTANEOUS | Status: AC
  Administered 2017-01-24: 4 via TOPICAL
  Filled 2017-01-24: qty 60

## 2017-01-24 MED ORDER — CEFAZOLIN SODIUM-DEXTROSE 2-4 GM/100ML-% IV SOLN: 2.0000 g | INTRAVENOUS | Status: DC

## 2017-01-24 NOTE — Progress Notes (Signed)
Patient brought to Endo for TEE. No consents on patient's chart. Patient responsive to name and oriented to self and location, but not oriented to time. Patient unable to sign own consent forms. Endo RN attempted to call wife multiple times and MD attempted to find wife within hospital. Patient is being rescheduled for 9.25.18 @ 0900 for TEE.   Spoke with bedside RN about obtaining consent if wife returns or obtaining a verbal consent this evening when wife is back at home.

## 2017-01-24 NOTE — Progress Notes (Signed)
Progress Note  Patient Name: Patrick Knox Date of Encounter: 01/24/2017  Primary Cardiologist: Jens Som  Subjective   Knows he is at [facility] and the month is Sept, oriented to self, tells me he know he needs his pacer removed.  Denies any CP, palpitations or SOB  He is intermittently falling asleep   The family says he is much more appropriate than yesterday  Inpatient Medications    Scheduled Meds: . [MAR Hold] carvedilol  3.125 mg Oral BID  . chlorhexidine  60 mL Topical Once  . gentamicin irrigation  80 mg Irrigation To Cath  . [MAR Hold] polyethylene glycol  17 g Oral Daily  . [MAR Hold] senna-docusate  1 tablet Oral BID   Continuous Infusions: . sodium chloride    . sodium chloride    . sodium chloride    . [MAR Hold]  ceFAZolin (ANCEF) IV Stopped (01/24/17 0835)  .  ceFAZolin (ANCEF) IV     PRN Meds: [MAR Hold] acetaminophen **OR** [MAR Hold] acetaminophen, [MAR Hold]  morphine injection, [MAR Hold] ondansetron **OR** [MAR Hold] ondansetron (ZOFRAN) IV   Vital Signs    Vitals:   01/24/17 0656 01/24/17 0740 01/24/17 0938 01/24/17 1234  BP: 102/62 111/64 118/65 110/72  Pulse: 68 64 62 62  Resp: Temp: 98.3 F (36.8 C) 98.3 F (36.8 C) 97.6 F (36.4 C) 98.3 F (36.8 C)  TempSrc:  Oral Oral Oral  SpO2: 100% 99%  97%  Weight:      Height:        Intake/Output Summary (Last 24 hours) at 01/24/17 1307 Last data filed at 01/24/17 0900  Gross per 24 hour  Intake           894.88 ml  Output              950 ml  Net           -55.12 ml   Filed Weights   01/23/17 0209 01/23/17 2017 01/24/17 0500  Weight: 149 lb 14.6 oz (68 kg) 149 lb 14.6 oz (68 kg) 149 lb 14.6 oz (68 kg)    Telemetry    AV pacing - Personally Reviewed  ECG    No new EKGs - Personally Reviewed  Physical Exam   GEN: No acute distress.   Neck: 7 cm JVD Cardiac: RRR, no murmurs, rubs, or gallops.  Respiratory: CTA b/l. Left PM dressing in place, previously described  (incision is open and leads can be visualized) GI: Soft, nontender, non-distended  MS: No edema; No deformity. Neuro:  Nonfocal , he appears to be more oriented today then previously described, RN reports periods of confusion continue Psych: Normal affect   Labs    Chemistry  Recent Labs Lab 01/21/17 1030  01/22/17 0509 01/23/17 0324 01/24/17 0605  NA 127*  < > 132* 136 133*  K 4.4  < > 3.7 3.8 4.1  CL 97*  < > 107 107 109  CO2 18*  < > 14* 21* 16*  GLUCOSE 192*  < > 135* 142* 149*  BUN 84*  < > 77* 68* 61*  CREATININE 3.40*  < > 2.60* 2.18* 1.88*  CALCIUM 9.1  < > 8.7* 8.8* 8.4*  PROT 8.1  --   --   --  5.8*  ALBUMIN 3.3*  --   --   --  2.0*  AST 173*  --   --   --  78*  ALT 83*  --   --   --  29  ALKPHOS 54  --   --   --  47  BILITOT 1.5*  --   --   --  0.9  GFRNONAA 16*  < > 23* 28* 33*  GFRAA 19*  < > 26* 32* 39*  ANIONGAP 12  < > < > = values in this interval not displayed.   Hematology  Recent Labs Lab 01/22/17 0509 01/23/17 0324 01/24/17 0605  WBC 11.6* 11.7* 15.1*  RBC 4.74 4.76 4.68  HGB 13.8 13.6 13.3  HCT 39.2 39.7 37.9*  MCV 82.7 83.4 81.0  MCH 29.1 28.6 28.4  MCHC 35.2 34.3 35.1  RDW 14.9 15.1 15.5  PLT 161 183 160    Cardiac Enzymes  Recent Labs Lab 01/21/17 1530 01/21/17 2329 01/22/17 0509 01/22/17 0925  TROPONINI 0.18* 0.17* 0.15* 0.13*   No results for input(s): TROPIPOC in the last 168 hours.   BNP  Recent Labs Lab 01/21/17 2329  BNP 764.7*     DDimer No results for input(s): DDIMER in the last 168 hours.   Radiology    No results found.  Cardiac Studies   Blood cultures now growing out Staph  Patient Profile     76 y.o. male admitted with altered mental status, growing out staph aureus now, with drainage from PM pocket. He was hypotensive and this has resolved. His renal function has improved.  Assessment & Plan    1. MSSA with PM pocket infection      SJM BiVe pacer implanted 02/18/16     staph UTI  as well     he is improved clinically with IV anti-biotics.     For BiVe pacing system extraction today, and temp-perm implant     Appreciate ID, antibiotics as per their recommendations     TEE prior     No fever last 24 hours     WBC increased from yesterday to 15.1  RN mentions she did not feel patient could sign consents, I asked that when his wife arrives to let us know, or to obtain phone consent.   2. CHB      Will need temp-pes placement until cleared for re-implant of permanent device by ID     ? Timing  3. Chronic systolic heart failure       he appears to be well compensated.   4. Paroxysmal Afib     CHA2DS2Vasc is 4, on heparin gtt     INR is 2.03 today     Hold heparin gtt this AM  5. CAD (remote CABG)     No anginal complaints   For questions or updates, please contact [facility] Please consult [email or web address] for contact info under Cardiology/STEMI.       Pt has sepsis of unknown source with infection of the pacer-not sure whether primary or secondary.  He has back pain and abnormal urine culture-- might these be the source/consequences of bacteremia  He will undergo TEE today-- if vegetations are present and not excessively large, we will remove his leads having placed a tempperm device until which time he can be reimplanted.    Duration of therapy will be informed by TEE results ie endocarditis or not  Spoke with Dr [facility] ID -- with back pain will need CT ( not candidate for MRI)  Spoke with Dr Leonia Reeves re INR--with hx of CABG bleeding risk with mildly elevated INR should be minimal-- His INR has continued to drift  Signed, Sherryl Manges, MD  01/24/2017, 1:07 PM  Patient ID: Patrick Knox, male   DOB: [birth year], 76 y.o.   MRN: [id number]

## 2017-01-24 NOTE — Progress Notes (Signed)
Patients wife was at bedside to sign consent, but stated that the MD had not yet explained the procedure to her. Consent not signed at this moment. After the MD came and explained the procedure to her, they came and took the patient to ENDO without my knowledge. When I went to see if the wife could sign consent, she was no longer at the bedside. Patients wife called, but no answer. I left a voicemail for the wife to call back as soon as possible. Endo notified of this information.

## 2017-01-24 NOTE — Progress Notes (Signed)
ANTICOAGULATION CONSULT NOTE - Follow Up Consult  Pharmacy Consult for heparin while warfarin on hold  Indication: atrial fibrillation/ACS   Labs:  Recent Labs  01/21/17 2329  01/22/17 0509 01/22/17 0925  01/23/17 0324 01/23/17 1357 01/23/17 2305 01/24/17 0605  HGB  --   < > 13.8  --   --  13.6  --   --  13.3  HCT  --   --  39.2  --   --  39.7  --   --  37.9*  PLT  --   --  161  --   --  183  --   --  160  LABPROT  --   --  21.8*  --   --  22.6*  --   --  22.7*  INR  --   --  1.92  --   --  2.01  --   --  2.02  HEPARINUNFRC  --   --   --  0.14*  < > 0.21* 0.19* 0.37 0.34  CREATININE 2.65*  --  2.60*  --   --  2.18*  --   --  1.88*  TROPONINI 0.17*  --  0.15* 0.13*  --   --   --   --   --   < > = values in this interval not displayed.   Assessment: 76yo male on heparin for afib. PTA warfarin on hold.    Heparin bridge was started on 9/22 when the patient's INR was noted to be 1.8. Despite warfarin being held, then INR has risen slightly to 2.02. Heparin level this morning is also therapeutic (0.34, goal of 0.3-0.7).   Plans are for removal of DDD/Biv PM on 9/24 - both MD and cardiology aware of INR and Hep drip. Will continue for now until told otherwise since INR and HL both on lower end of therapeutic range and CBC stable.  Heparin held this AM for plans to go to the OR - will f/u restart plans post-op  Goal of Therapy:  Heparin level 0.3-0.7 units/ml Monitor platelets by anticoagulation protocol: Yes  Plan:  1. Heparin held for OR plans 2. Will follow-up anticoagulation plans post-op (resume heparin or wait until AM labs to start when INR<2)  Thank you for allowing pharmacy to be a part of this patient's care.  Georgina Pillion, PharmD, BCPS Clinical Pharmacist Pager: [phone] Clinical phone for 01/24/2017 from 7a-3:30p: [phone] If after 3:30p, please call main pharmacy at: [phone] 01/24/2017 11:06 AM

## 2017-01-24 NOTE — Progress Notes (Signed)
[facility]  [facility] Infusion Coordinator will follow Mrs. Eckardt with ID team to support home IV ABX at DC if needed.  If patient discharges after hours, please call [phone].   Patrick Knox 01/24/2017, 11:23 AM

## 2017-01-24 NOTE — Progress Notes (Addendum)
PROGRESS NOTE    Patrick Knox   RUE:[id number]  DOB: 05-06-1940  DOA: 01/21/2017 PCP: Kirby Funk, MD   Brief Narrative:  Patrick Knox 76 y.o. male with PPM, history of CAD with CABG, HTN, sCHF, A-fib on Coumadin, complete heart block with pacemaker, CKD 3, tobacco abuse presents from home with confusion and weakness. Apparently also started Flexeril recently. Found to have BP 86/46, sodium 127, exposed pacer wires and and pus coming out of pacemaker pocket. Blood cultures 2/2 for MSSA.   Subjective: Sleepy this AM. Will not awaken to talk to me    Assessment & Plan:   Principal Problem:   Sepsis, MSSA bacteremia and in UA (likely due to hematogenic spread) -  fevers / encephalopathic - pacer is infected, leads exposed- to be removed today- TTE also to be done today- cardiology following - cont ancef  - appreciate ID follow up  Active Problems:   Acute metabolic encephalopathy - likely due to infection- follow    Paroxysmal atrial fibrillation (HCC) - resumed low dose of Coreg for now to prevent RVR - Heparin    Hypertension - Coreg- lower than home dose for now- holding Lisinopril    Acute renal failure superimposed on stage 3 chronic kidney disease (HCC) - baseline Cr 1.1 - admitted with Cr 3.4- likely prerenal and ATN in setting of ACE I and sepsis - steadily improving - d/c IVF 9/23- appeared to be eating/drinking well and had good urine output- cont to follow - Cr better today   Metabolic acidosis - improving- likely from AKI and sepsis    Chronic systolic CHF (congestive heart failure) (HCC) - ECHO this admission: EF 35-40% with focal hypokinesis, grade 1 dCHF - stop IVF today- follow daily weights - not on chronic diuretics    Hypotension/ hyponatremia - resolved with fluid replacement  Mildly elevated troponin - likely due to sepsis/ hypotension    Tobacco abuse - counseling - states he quit smoking about 2 wks prior to admission- d/c  patch  DVT prophylaxis: Heparin Code Status: Full code Family Communication:  Disposition Plan: to be determined Consultants:   Cardiology  ID Procedures:  2 D ECHO Study Conclusions  - Left ventricle: The cavity size was mildly dilated. Systolic   function was moderately reduced. The estimated ejection fraction   was in the range of 35% to 40%. Diffuse hypokinesis. Dyskinesis   of the basal inferolateral myocardium. Akinesis of the basal   inferior myocardium. Doppler parameters are consistent with   abnormal left ventricular relaxation (grade 1 diastolic   dysfunction). - Aortic valve: Transvalvular velocity was within the normal range.   There was no stenosis. There was no regurgitation. - Mitral valve: Transvalvular velocity was within the normal range.   There was no evidence for stenosis. There was trivial   regurgitation. - Right ventricle: The cavity size was normal. Wall thickness was   normal. Systolic function was mildly reduced. - Tricuspid valve: There was mild regurgitation. - Pulmonary arteries: Systolic pressure was within the normal   range. PA peak pressure: 27 mm Hg (S).  Antimicrobials:  Anti-infectives    Start     Dose/Rate Route Frequency Ordered Stop   01/24/17 1030  gentamicin (GARAMYCIN) 80 mg in sodium chloride irrigation 0.9 % 500 mL irrigation     80 mg Irrigation To Cath Lab 01/24/17 0944 01/25/17 1030   01/24/17 0945  ceFAZolin (ANCEF) IVPB 2g/100 mL premix     2 g 200 mL/hr over 30  Minutes Intravenous On call 01/24/17 0944 01/25/17 0945   01/23/17 1600  [MAR Hold]  ceFAZolin (ANCEF) IVPB 2g/100 mL premix     (MAR Hold since 01/24/17 1229)   2 g 200 mL/hr over 30 Minutes Intravenous Every 8 hours 01/23/17 1331     01/22/17 1100  ceFAZolin (ANCEF) IVPB 2g/100 mL premix  Status:  Discontinued     2 g 200 mL/hr over 30 Minutes Intravenous Every 12 hours 01/22/17 1046 01/23/17 1331   01/22/17 0500  vancomycin (VANCOCIN) IVPB 750 mg/150 ml  premix  Status:  Discontinued     750 mg 150 mL/hr over 60 Minutes Intravenous Every 24 hours 01/21/17 1107 01/22/17 1030   01/22/17 0000  piperacillin-tazobactam (ZOSYN) IVPB 2.25 g  Status:  Discontinued     2.25 g 100 mL/hr over 30 Minutes Intravenous Every 8 hours 01/21/17 2305 01/22/17 1030   01/21/17 1115  vancomycin (VANCOCIN) IVPB 1000 mg/200 mL premix     1,000 mg 200 mL/hr over 60 Minutes Intravenous  Once 01/21/17 1104 01/21/17 1257   01/21/17 1100  piperacillin-tazobactam (ZOSYN) IVPB 3.375 g     3.375 g 100 mL/hr over 30 Minutes Intravenous  Once 01/21/17 1051 01/21/17 1150       Objective: Vitals:   01/24/17 0656 01/24/17 0740 01/24/17 0938 01/24/17 1234  BP: 102/62 111/64 118/65 110/72  Pulse: 68 64 62 62  Resp: Temp: 98.3 F (36.8 C) 98.3 F (36.8 C) 97.6 F (36.4 C) 98.3 F (36.8 C)  TempSrc:  Oral Oral Oral  SpO2: 100% 99%  97%  Weight:      Height:        Intake/Output Summary (Last 24 hours) at 01/24/17 1349 Last data filed at 01/24/17 0900  Gross per 24 hour  Intake           894.88 ml  Output              950 ml  Net           -55.12 ml   Filed Weights   01/23/17 0209 01/23/17 2017 01/24/17 0500  Weight: 68 kg (149 lb 14.6 oz) 68 kg (149 lb 14.6 oz) 68 kg (149 lb 14.6 oz)    Examination: General exam: Appears comfortable - somnolent HEENT: PERRLA, oral mucosa moist, no sclera icterus or thrush Respiratory system: Clear to auscultation. Respiratory effort normal. Cardiovascular system: S1 & S2 heard, RRR.  No murmurs  Gastrointestinal system: Abdomen soft, non-tender, nondistended. Normal bowel sound. No organomegaly Central nervous system:   No focal neurological deficits. Extremities: No cyanosis, clubbing or edema Skin:   ulcer in skin above pacemaker Psychiatry:  Difficult to assess as sleepy    Data Reviewed: I have personally reviewed following labs and imaging studies  CBC:  Recent Labs Lab 01/21/17 1030  01/22/17 0509 01/23/17 0324 01/24/17 0605  WBC 11.4* 11.6* 11.7* 15.1*  NEUTROABS 9.8*  --   --   --   HGB 14.3 13.8 13.6 13.3  HCT 41.0 39.2 39.7 37.9*  MCV 83.5 82.7 83.4 81.0  PLT 193 161 183 160   Basic Metabolic Panel:  Recent Labs Lab 01/21/17 1030 01/21/17 2329 01/22/17 0509 01/23/17 0324 01/24/17 0605  NA 127* 131* 132* 136 133*  K 4.4 4.1 3.7 3.8 4.1  CL 97* 103 107 107 109  CO2 18* 15* 14* 21* 16*  GLUCOSE 192* 142* 135* 142* 149*  BUN 84* 75* 77* 68* 61*  CREATININE  3.40* 2.65* 2.60* 2.18* 1.88*  CALCIUM 9.1 9.0 8.7* 8.8* 8.4*   GFR: Estimated Creatinine Clearance: 32.7 mL/min (A) (by C-G formula based on SCr of 1.88 mg/dL (H)). Liver Function Tests:  Recent Labs Lab 01/21/17 1030 01/24/17 0605  AST 173* 78*  ALT 83* 29  ALKPHOS 54 47  BILITOT 1.5* 0.9  PROT 8.1 5.8*  ALBUMIN 3.3* 2.0*   No results for input(s): LIPASE, AMYLASE in the last 168 hours. No results for input(s): AMMONIA in the last 168 hours. Coagulation Profile:  Recent Labs Lab 01/21/17 1030 01/22/17 0509 01/23/17 0324 01/24/17 0605  INR 1.80 1.92 2.01 2.02   Cardiac Enzymes:  Recent Labs Lab 01/21/17 1030 01/21/17 1530 01/21/17 2329 01/22/17 0509 01/22/17 0925  TROPONINI 0.18* 0.18* 0.17* 0.15* 0.13*   BNP (last 3 results) No results for input(s): PROBNP in the last 8760 hours. HbA1C:  Recent Labs  01/22/17 0509  HGBA1C 5.9*   CBG:  Recent Labs Lab 01/21/17 2127 01/22/17 0834 01/23/17 0738 01/24/17 0737  GLUCAP 143* 161* 126* 156*   Lipid Profile:  Recent Labs  01/22/17 0509  CHOL 104  HDL 11*  LDLCALC 55  TRIG 147*  CHOLHDL 9.5   Thyroid Function Tests:  Recent Labs  01/22/17 0509  TSH 1.867   Anemia Panel: No results for input(s): VITAMINB12, FOLATE, FERRITIN, TIBC, IRON, RETICCTPCT in the last 72 hours. Urine analysis:    Component Value Date/Time   COLORURINE YELLOW 01/21/2017 1215   APPEARANCEUR CLOUDY (A) 01/21/2017 1215    LABSPEC 1.025 01/21/2017 1215   PHURINE 5.5 01/21/2017 1215   GLUCOSEU NEGATIVE 01/21/2017 1215   HGBUR LARGE (A) 01/21/2017 1215   BILIRUBINUR NEGATIVE 01/21/2017 1215   KETONESUR NEGATIVE 01/21/2017 1215   PROTEINUR >300 (A) 01/21/2017 1215   NITRITE NEGATIVE 01/21/2017 1215   LEUKOCYTESUR NEGATIVE 01/21/2017 1215   Sepsis Labs: (procalcitonin:4,lacticidven:4) ) Recent Results (from the past 240 hour(s))  Culture, blood (routine x 2)     Status: Abnormal   Collection Time: 01/21/17 10:50 AM  Result Value Ref Range Status   Specimen Description BLOOD LEFT FOREARM  Final   Special Requests   Final    BOTTLES DRAWN AEROBIC AND ANAEROBIC Blood Culture adequate volume   Culture  Setup Time   Final    GRAM POSITIVE COCCI IN CLUSTERS IN BOTH AEROBIC AND ANAEROBIC BOTTLES CRITICAL VALUE NOTED.  VALUE IS CONSISTENT WITH PREVIOUSLY REPORTED AND CALLED VALUE.    Culture (A)  Final    STAPHYLOCOCCUS AUREUS SUSCEPTIBILITIES PERFORMED ON PREVIOUS CULTURE WITHIN THE LAST 5 DAYS. Performed at [facility], [address], [place], [place] [address]    Report Status 01/24/2017 FINAL  Final  Culture, blood (routine x 2)     Status: Abnormal   Collection Time: 01/21/17 11:08 AM  Result Value Ref Range Status   Specimen Description BLOOD LEFT WRIST  Final   Special Requests   Final    BOTTLES DRAWN AEROBIC AND ANAEROBIC Blood Culture adequate volume   Culture  Setup Time   Final    GRAM POSITIVE COCCI IN CLUSTERS IN BOTH AEROBIC AND ANAEROBIC BOTTLES CRITICAL RESULT CALLED TO, READ BACK BY AND VERIFIED WITH: K.COOK PHARMD 01/22/17 0408 L.CHAMPION Performed at [facility], [address] [place], [place] [address]    Culture STAPHYLOCOCCUS AUREUS (A)  Final   Report Status 01/24/2017 FINAL  Final   Organism ID, Bacteria STAPHYLOCOCCUS AUREUS  Final      Susceptibility  Staphylococcus aureus - MIC*    CIPROFLOXACIN <=0.5 SENSITIVE Sensitive     ERYTHROMYCIN <=0.25  SENSITIVE Sensitive     GENTAMICIN <=0.5 SENSITIVE Sensitive     OXACILLIN 0.5 SENSITIVE Sensitive     TETRACYCLINE <=1 SENSITIVE Sensitive     VANCOMYCIN <=0.5 SENSITIVE Sensitive     TRIMETH/SULFA <=10 SENSITIVE Sensitive     CLINDAMYCIN <=0.25 SENSITIVE Sensitive     RIFAMPIN <=0.5 SENSITIVE Sensitive     Inducible Clindamycin NEGATIVE Sensitive     * STAPHYLOCOCCUS AUREUS  Blood Culture ID Panel (Reflexed)     Status: Abnormal   Collection Time: 01/21/17 11:08 AM  Result Value Ref Range Status   Enterococcus species NOT DETECTED NOT DETECTED Final   Vancomycin resistance NOT DETECTED NOT DETECTED Final   Listeria monocytogenes NOT DETECTED NOT DETECTED Final   Staphylococcus species DETECTED (A) NOT DETECTED Final    Comment: CRITICAL RESULT CALLED TO, READ BACK BY AND VERIFIED WITH: K.COOK PHARMD 01/22/17 0408 L.CHAMPION    Staphylococcus aureus DETECTED (A) NOT DETECTED Final    Comment: CRITICAL RESULT CALLED TO, READ BACK BY AND VERIFIED WITH: K.COOK PHARMD 01/22/17 0408 L.CHAMPION    Methicillin resistance NOT DETECTED NOT DETECTED Final   Streptococcus species NOT DETECTED NOT DETECTED Final   Streptococcus agalactiae NOT DETECTED NOT DETECTED Final   Streptococcus pneumoniae NOT DETECTED NOT DETECTED Final   Streptococcus pyogenes NOT DETECTED NOT DETECTED Final   Acinetobacter baumannii NOT DETECTED NOT DETECTED Final   Enterobacteriaceae species NOT DETECTED NOT DETECTED Final   Enterobacter cloacae complex NOT DETECTED NOT DETECTED Final   Escherichia coli NOT DETECTED NOT DETECTED Final   Klebsiella oxytoca NOT DETECTED NOT DETECTED Final   Klebsiella pneumoniae NOT DETECTED NOT DETECTED Final   Proteus species NOT DETECTED NOT DETECTED Final   Serratia marcescens NOT DETECTED NOT DETECTED Final   Carbapenem resistance NOT DETECTED NOT DETECTED Final   Haemophilus influenzae NOT DETECTED NOT DETECTED Final   Neisseria meningitidis NOT DETECTED NOT DETECTED Final    Pseudomonas aeruginosa NOT DETECTED NOT DETECTED Final   Candida albicans NOT DETECTED NOT DETECTED Final   Candida glabrata NOT DETECTED NOT DETECTED Final   Candida krusei NOT DETECTED NOT DETECTED Final   Candida parapsilosis NOT DETECTED NOT DETECTED Final   Candida tropicalis NOT DETECTED NOT DETECTED Final    Comment: Performed at [facility], [address], [place], [place] [address]  Urine culture     Status: Abnormal   Collection Time: 01/21/17 12:15 PM  Result Value Ref Range Status   Specimen Description URINE, CLEAN CATCH  Final   Special Requests NONE  Final   Culture 70,000 COLONIES/mL STAPHYLOCOCCUS AUREUS (A)  Final   Report Status 01/23/2017 FINAL  Final   Organism ID, Bacteria STAPHYLOCOCCUS AUREUS (A)  Final      Susceptibility   Staphylococcus aureus - MIC*    CIPROFLOXACIN <=0.5 SENSITIVE Sensitive     GENTAMICIN <=0.5 SENSITIVE Sensitive     NITROFURANTOIN 32 SENSITIVE Sensitive     OXACILLIN <=0.25 SENSITIVE Sensitive     TETRACYCLINE <=1 SENSITIVE Sensitive     VANCOMYCIN <=0.5 SENSITIVE Sensitive     TRIMETH/SULFA <=10 SENSITIVE Sensitive     CLINDAMYCIN <=0.25 SENSITIVE Sensitive     RIFAMPIN <=0.5 SENSITIVE Sensitive     Inducible Clindamycin NEGATIVE Sensitive     * 70,000 COLONIES/mL STAPHYLOCOCCUS AUREUS  Surgical pcr screen     Status: Abnormal   Collection Time: 01/24/17  1:35 AM  Result Value Ref Range Status   MRSA, PCR NEGATIVE NEGATIVE Final   Staphylococcus aureus POSITIVE (A) NEGATIVE Final    Comment: (NOTE) The Xpert SA Assay (FDA approved for NASAL specimens in patients 50 years of age and older), is one component of a comprehensive surveillance program. It is not intended to diagnose infection nor to guide or monitor treatment.          Radiology Studies: No results found.    Scheduled Meds: . [MAR Hold] carvedilol  3.125 mg Oral BID  . gentamicin irrigation  80 mg Irrigation To Cath  . [MAR Hold] polyethylene  glycol  17 g Oral Daily  . [MAR Hold] senna-docusate  1 tablet Oral BID   Continuous Infusions: . sodium chloride    . sodium chloride    . sodium chloride    . [MAR Hold]  ceFAZolin (ANCEF) IV Stopped (01/24/17 0835)  .  ceFAZolin (ANCEF) IV       LOS: 3 days    Time spent in minutes: 35    Calvert Cantor, MD [facility] Pager: [email or web address] Password [email or web address] 01/24/2017, 1:49 PM

## 2017-01-24 NOTE — Progress Notes (Signed)
Renee from Cath lab stated that are planning to continue with the procedure for his pacemaker this afternoon and stated to stop the heparin drip. Orders followed. Patient is unable to sign consent form for procedure. Renee aware. Will continue to monitor.

## 2017-01-24 NOTE — Evaluation (Signed)
Physical Therapy Evaluation Patient Details Name: Patrick Knox MRN: [id number] DOB: 1940/06/13 Today's Date: 01/24/2017   History of Present Illness  Pt is 76 yo male with known HTN, HL, sCHF, EF 35%, a-fib on Coumadin, complete heart block and s/p pacemaker, CAD, s/p CABG, tobacco use, CKD stage III, presented with lethargy and weakness, poor oral intake, progressively worsening confusion. 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 Pt currently with functional limitations due to the deficits listed below (see PT Problem List). Independent prior t admission; presents with generalized weakness, decr problem-solving and decr initiation, all effecting functional mobility;  Pt will benefit from skilled PT to increase their independence and safety with mobility to allow discharge to the venue listed below.       Follow Up Recommendations SNF    Equipment Recommendations  Rolling walker with 5" wheels;3in1 (PT)    Recommendations for Other Services       Precautions / Restrictions Precautions Precautions: Fall      Mobility  Bed Mobility Overal bed mobility: Needs Assistance Bed Mobility: Supine to Sit     Supine to sit: Mod assist     General bed mobility comments: increased time as well and VCs for sequencing; Mod assist to clear LEs from EOB and elevate trunk to sit; slow moving  Transfers Overall transfer level: Needs assistance Equipment used: 1 person hand held assist (and support at gait belt) Transfers: Sit to/from [employer] Sit to Stand: Min assist Stand pivot transfers: Mod assist       General transfer comment: Cues for anterior weight shift, push through feet and stand up straight; Mod assist to turn to chair; cues for hand placement adn safety  Ambulation/Gait                Stairs            Wheelchair Mobility    Modified Rankin (Stroke Patients Only)        Balance     Sitting balance-Leahy Scale: Fair     Standing balance support: Bilateral upper extremity supported Standing balance-Leahy Scale: Poor                               Pertinent Vitals/Pain Pain Assessment: Faces Faces Pain Scale: Hurts little more Pain Location: back--stiffness Pain Descriptors / Indicators: Sore Pain Intervention(s): Monitored during session    Home Living Family/patient expects to be discharged to:: Skilled nursing facility Living Arrangements: Spouse/significant other Available Help at Discharge: Family;Available PRN/intermittently Type of Home: House Home Access: Stairs to enter   Entrance Stairs-Number of Steps: 1 Home Layout: Two level;Able to live on main level with bedroom/bathroom        Prior Function Level of Independence: Independent   Gait / Transfers Assistance Needed: Pt reports he normally does not use any AD for ambulation  ADL's / Homemaking Assistance Needed: Pt reports he normally baths and dresses himself        Hand Dominance   Dominant Hand: Right    Extremity/Trunk Assessment   Upper Extremity Assessment Upper Extremity Assessment: Defer to OT evaluation    Lower Extremity Assessment Lower Extremity Assessment: Generalized weakness       Communication   Communication: No difficulties  Cognition Arousal/Alertness: Awake/alert Behavior During Therapy: Flat affect Overall Cognitive Status: Impaired/Different from baseline Area of Impairment: Orientation;Following commands;Safety/judgement;Problem solving  Orientation Level: Disoriented to;Time (october 2019)     Following Commands: Follows one step commands with increased time;Follows one step commands consistently Safety/Judgement: Decreased awareness of safety;Decreased awareness of deficits   Problem Solving: Slow processing;Decreased initiation;Difficulty sequencing;Requires tactile cues;Requires verbal  cues General Comments: Pt moves very slowly and takes increased time to initiate      General Comments      Exercises     Assessment/Plan    PT Assessment Patient needs continued PT services  PT Problem List Decreased strength;Decreased activity tolerance;Decreased balance;Decreased mobility;Decreased coordination;Decreased cognition;Decreased knowledge of use of DME;Decreased safety awareness;Decreased knowledge of precautions       PT Treatment Interventions DME instruction;Gait training;Functional mobility training;Therapeutic activities;Therapeutic exercise;Balance training;Neuromuscular re-education;Cognitive remediation;Patient/family education    PT Goals (Current goals can be found in the Care Plan section)  Acute Rehab PT Goals Patient Stated Goal: to go home PT Goal Formulation: With patient Time For Goal Achievement: 02/07/17 Potential to Achieve Goals: Good    Frequency Min 2X/week   Barriers to discharge        Co-evaluation               AM-PAC PT "6 Clicks" Daily Activity  Outcome Measure Difficulty turning over in bed (including adjusting bedclothes, sheets and blankets)?: A Lot Difficulty moving from lying on back to sitting on the side of the bed? : Unable Difficulty sitting down on and standing up from a chair with arms (e.g., wheelchair, bedside commode, etc,.)?: Unable Help needed moving to and from a bed to chair (including a wheelchair)?: A Little Help needed walking in hospital room?: A Lot Help needed climbing 3-5 steps with a railing? : A Lot 6 Click Score: 11    End of Session Equipment Utilized During Treatment: Gait belt Activity Tolerance: Patient tolerated treatment well Patient left: in chair;with call bell/phone within reach;with chair alarm set Nurse Communication: Mobility status;Other (comment) (pt wants to eat, and to check chair alarm) PT Visit Diagnosis: Unsteadiness on feet (R26.81);Other abnormalities of gait and mobility  (R26.89);Muscle weakness (generalized) (M62.81)    Time: 4098-1191 PT Time Calculation (min) (ACUTE ONLY): 25 min   Charges:   PT Evaluation $PT Eval Moderate Complexity: 1 Mod PT Treatments $Therapeutic Activity: 8-22 mins   PT G Codes:        Van Clines, PT  Acute Rehabilitation Services Pager [phone] Office [phone]   Patrick Knox 01/24/2017, 4:44 PM

## 2017-01-24 NOTE — Progress Notes (Signed)
Called patients wife, Mora Bellman. Wife stated that she would be here between 10:30 and 11:00 p.m. To sign consent for patients procedures.

## 2017-01-24 NOTE — Progress Notes (Signed)
Unfortunately staff unable to get consents signed by wife/family.  Unable to pursue TEE/device extraction today.  He is on the schedule for tomorrow TEE 0900, will evaluate cath lab schedule for next available time for device extraction and temp-perm implant.  Francis Dowse, PA-C

## 2017-01-24 NOTE — Evaluation (Signed)
Occupational Therapy Evaluation Patient Details Name: Patrick Knox MRN: [id number] DOB: Jun 19, 1940 Today's Date: 01/24/2017    History of Present Illness Pt is 76 yo male with known HTN, HL, sCHF, EF 35%, a-fib on Coumadin, complete heart block and s/p pacemaker, CAD, s/p CABG, tobacco use, CKD stage III, presented with lethargy and weakness, poor oral intake, progressively worsening confusion. 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 He will benefit from acute OT with follow up OT at SNF to get back to PLOF.    Follow Up Recommendations  SNF;Supervision/Assistance - 24 hour    Equipment Recommendations  Other (comment) (TBD at next venue)       Precautions / Restrictions Precautions Precautions: Fall Restrictions Weight Bearing Restrictions: No      Mobility Bed Mobility Overal bed mobility: Needs Assistance Bed Mobility: Supine to Sit     Supine to sit: Max assist     General bed mobility comments: increased time as well and VCs for sequencing  Transfers Overall transfer level: Needs assistance Equipment used: 1 person hand held assist (standing in front him with both of his hands on my arms) Transfers: Sit to/from [employer] Sit to Stand: Min assist Stand pivot transfers: Mod assist            Balance Overall balance assessment: Needs assistance Sitting-balance support: No upper extremity supported;Feet supported Sitting balance-Leahy Scale: Fair     Standing balance support: Bilateral upper extremity supported Standing balance-Leahy Scale: Poor                             ADL either performed or assessed with clinical judgement   ADL Overall ADL's : Needs assistance/impaired Eating/Feeding: Supervision/ safety;Set  up Eating/Feeding Details (indicate cue type and reason): supported sitting Grooming: Moderate assistance Grooming Details (indicate cue type and reason): supported sitting Upper Body Bathing: Moderate assistance Upper Body Bathing Details (indicate cue type and reason): supported sitting Lower Body Bathing: Maximal assistance Lower Body Bathing Details (indicate cue type and reason): Min A sit<>stand with Mod A to maintain Upper Body Dressing : Maximal assistance Upper Body Dressing Details (indicate cue type and reason): supported sitting Lower Body Dressing: Total assistance Lower Body Dressing Details (indicate cue type and reason): Min A sit<>stand with Mod A to maintain Toilet Transfer: Moderate assistance;Stand-pivot Toilet Transfer Details (indicate cue type and reason): Bil UE support with therapist standing in front of him--bed<>chiar and back Toileting- Clothing Manipulation and Hygiene: Total assistance Toileting - Clothing Manipulation Details (indicate cue type and reason): Mod A standing balance             Vision Patient Visual Report: No change from baseline              Pertinent Vitals/Pain Pain Assessment: Faces Faces Pain Scale: Hurts little more Pain Location: back--stiffness Pain Descriptors / Indicators: Sore Pain Intervention(s): Limited activity within patient's tolerance;Monitored during session;Repositioned     Hand Dominance Right   Extremity/Trunk Assessment Upper Extremity Assessment Upper Extremity Assessment: Generalized weakness   Lower Extremity Assessment Lower Extremity Assessment: Defer to PT evaluation       Communication Communication Communication: No difficulties   Cognition Arousal/Alertness: Awake/alert Behavior During Therapy: Flat affect Overall Cognitive Status: Impaired/Different from  baseline Area of Impairment: Orientation;Following commands;Safety/judgement;Problem solving                 Orientation  Level: Disoriented to;Time (october 2019)     Following Commands: Follows one step commands with increased time;Follows one step commands consistently Safety/Judgement: Decreased awareness of safety;Decreased awareness of deficits   Problem Solving: Slow processing;Decreased initiation;Difficulty sequencing;Requires tactile cues;Requires verbal cues General Comments: Pt moves very slowly and takes increased time to initiate              Home Living Family/patient expects to be discharged to:: Skilled nursing facility Living Arrangements: Spouse/significant other                                      Prior Functioning/Environment Level of Independence: Independent  Gait / Transfers Assistance Needed: Pt reports he normally does not use any AD for ambulation ADL's / Homemaking Assistance Needed: Pt reports he normally baths and dresses himself            OT Problem List: Decreased strength;Impaired balance (sitting and/or standing);Decreased cognition;Decreased safety awareness;Decreased knowledge of use of DME or AE;Pain      OT Treatment/Interventions: Self-care/ADL training;Therapeutic activities;Patient/family education;DME and/or AE instruction;Balance training    OT Goals(Current goals can be found in the care plan section) Acute Rehab OT Goals Patient Stated Goal: to go home OT Goal Formulation: With patient Time For Goal Achievement: 02/07/17 Potential to Achieve Goals: Good  OT Frequency: Min 2X/week              AM-PAC PT "6 Clicks" Daily Activity     Outcome Measure Help from another person eating meals?: A Little Help from another person taking care of personal grooming?: A Lot Help from another person toileting, which includes using toliet, bedpan, or urinal?: Total Help from another person bathing (including washing, rinsing, drying)?: A Lot Help from another person to put on and taking off regular upper body clothing?: A Lot Help from  another person to put on and taking off regular lower body clothing?: Total 6 Click Score: 11   End of Session Equipment Utilized During Treatment: Gait belt  Activity Tolerance: Patient tolerated treatment well Patient left: in bed;with call bell/phone within reach;with bed alarm set  OT Visit Diagnosis: Unsteadiness on feet (R26.81);Other abnormalities of gait and mobility (R26.89);Muscle weakness (generalized) (M62.81);Pain Pain - part of body:  (back)                Time: 1610-9604 OT Time Calculation (min): 30 min Charges:  OT General Charges $OT Visit: 1 Visit OT Evaluation $OT Eval Moderate Complexity: 1 Mod OT Treatments $Self Care/Home Management : 8-22 mins Ignacia Palma, OTR/L [phone] 01/24/2017

## 2017-01-24 NOTE — Progress Notes (Signed)
INFECTIOUS DISEASE PROGRESS NOTE  ID: Patrick Knox is a 76 y.o. male with  Principal Problem:   Sepsis (HCC) Active Problems:   Hypertension   High cholesterol   Paroxysmal atrial fibrillation (HCC)   Acute renal failure superimposed on stage 3 chronic kidney disease (HCC)   Chronic systolic CHF (congestive heart failure) (HCC)   Hypotension   Acute metabolic encephalopathy   Hyponatremia   Elevated troponin   Tobacco abuse  Subjective: Feels poorly  Abtx:  Anti-infectives    Start     Dose/Rate Route Frequency Ordered Stop   01/23/17 1600  ceFAZolin (ANCEF) IVPB 2g/100 mL premix     2 g 200 mL/hr over 30 Minutes Intravenous Every 8 hours 01/23/17 1331     01/22/17 1100  ceFAZolin (ANCEF) IVPB 2g/100 mL premix  Status:  Discontinued     2 g 200 mL/hr over 30 Minutes Intravenous Every 12 hours 01/22/17 1046 01/23/17 1331   01/22/17 0500  vancomycin (VANCOCIN) IVPB 750 mg/150 ml premix  Status:  Discontinued     750 mg 150 mL/hr over 60 Minutes Intravenous Every 24 hours 01/21/17 1107 01/22/17 1030   01/22/17 0000  piperacillin-tazobactam (ZOSYN) IVPB 2.25 g  Status:  Discontinued     2.25 g 100 mL/hr over 30 Minutes Intravenous Every 8 hours 01/21/17 2305 01/22/17 1030   01/21/17 1115  vancomycin (VANCOCIN) IVPB 1000 mg/200 mL premix     1,000 mg 200 mL/hr over 60 Minutes Intravenous  Once 01/21/17 1104 01/21/17 1257   01/21/17 1100  piperacillin-tazobactam (ZOSYN) IVPB 3.375 g     3.375 g 100 mL/hr over 30 Minutes Intravenous  Once 01/21/17 1051 01/21/17 1150      Medications:  Scheduled: . carvedilol  3.125 mg Oral BID  . polyethylene glycol  17 g Oral Daily  . senna-docusate  1 tablet Oral BID    Objective: Vital signs in last 24 hours: Temp:  [98.2 F (36.8 C)-98.3 F (36.8 C)] 98.3 F (36.8 C) (09/24 0740) Pulse Rate:  [64-71] 64 (09/24 0740) Resp:  [15-18] 18 (09/24 0740) BP: (102-138)/(62-84) 111/64 (09/24 0740) SpO2:  [99 %-100 %] 99 % (09/24  0740) Weight:  [68 kg (149 lb 14.6 oz)] 68 kg (149 lb 14.6 oz) (09/24 0500)   General appearance: alert and no distress Resp: clear to auscultation bilaterally Chest wall: no tenderness, pacer site is dressed, non-tender.  Cardio: regular rate and rhythm GI: normal findings: bowel sounds normal and soft, non-tender  Lab Results  Recent Labs  01/23/17 0324 01/24/17 0605  WBC 11.7* 15.1*  HGB 13.6 13.3  HCT 39.7 37.9*  NA 136 133*  K 3.8 4.1  CL 107 109  CO2 21* 16*  BUN 68* 61*  CREATININE 2.18* 1.88*   Liver Panel  Recent Labs  01/21/17 1030 01/24/17 0605  PROT 8.1 5.8*  ALBUMIN 3.3* 2.0*  AST 173* 78*  ALT 83* 29  ALKPHOS 54 47  BILITOT 1.5* 0.9   Sedimentation Rate No results for input(s): ESRSEDRATE in the last 72 hours. C-Reactive Protein No results for input(s): CRP in the last 72 hours.  Microbiology: Recent Results (from the past 240 hour(s))  Culture, blood (routine x 2)     Status: Abnormal (Preliminary result)   Collection Time: 01/21/17 10:50 AM  Result Value Ref Range Status   Specimen Description BLOOD LEFT FOREARM  Final   Special Requests   Final    BOTTLES DRAWN AEROBIC AND ANAEROBIC Blood Culture adequate volume  Culture  Setup Time   Final    GRAM POSITIVE COCCI IN CLUSTERS IN BOTH AEROBIC AND ANAEROBIC BOTTLES CRITICAL VALUE NOTED.  VALUE IS CONSISTENT WITH PREVIOUSLY REPORTED AND CALLED VALUE. Performed at [facility], [address], [place], [place] [address]    Culture STAPHYLOCOCCUS AUREUS (A)  Final   Report Status PENDING  Incomplete  Culture, blood (routine x 2)     Status: Abnormal   Collection Time: 01/21/17 11:08 AM  Result Value Ref Range Status   Specimen Description BLOOD LEFT WRIST  Final   Special Requests   Final    BOTTLES DRAWN AEROBIC AND ANAEROBIC Blood Culture adequate volume   Culture  Setup Time   Final    GRAM POSITIVE COCCI IN CLUSTERS IN BOTH AEROBIC AND ANAEROBIC BOTTLES CRITICAL RESULT CALLED  TO, READ BACK BY AND VERIFIED WITH: K.COOK PHARMD 01/22/17 0408 L.CHAMPION Performed at [facility], [address], [place], [place] [address]    Culture STAPHYLOCOCCUS AUREUS (A)  Final   Report Status 01/24/2017 FINAL  Final   Organism ID, Bacteria STAPHYLOCOCCUS AUREUS  Final      Susceptibility   Staphylococcus aureus - MIC*    CIPROFLOXACIN <=0.5 SENSITIVE Sensitive     ERYTHROMYCIN <=0.25 SENSITIVE Sensitive     GENTAMICIN <=0.5 SENSITIVE Sensitive     OXACILLIN 0.5 SENSITIVE Sensitive     TETRACYCLINE <=1 SENSITIVE Sensitive     VANCOMYCIN <=0.5 SENSITIVE Sensitive     TRIMETH/SULFA <=10 SENSITIVE Sensitive     CLINDAMYCIN <=0.25 SENSITIVE Sensitive     RIFAMPIN <=0.5 SENSITIVE Sensitive     Inducible Clindamycin NEGATIVE Sensitive     * STAPHYLOCOCCUS AUREUS  Blood Culture ID Panel (Reflexed)     Status: Abnormal   Collection Time: 01/21/17 11:08 AM  Result Value Ref Range Status   Enterococcus species NOT DETECTED NOT DETECTED Final   Vancomycin resistance NOT DETECTED NOT DETECTED Final   Listeria monocytogenes NOT DETECTED NOT DETECTED Final   Staphylococcus species DETECTED (A) NOT DETECTED Final    Comment: CRITICAL RESULT CALLED TO, READ BACK BY AND VERIFIED WITH: K.COOK PHARMD 01/22/17 0408 L.CHAMPION    Staphylococcus aureus DETECTED (A) NOT DETECTED Final    Comment: CRITICAL RESULT CALLED TO, READ BACK BY AND VERIFIED WITH: K.COOK PHARMD 01/22/17 0408 L.CHAMPION    Methicillin resistance NOT DETECTED NOT DETECTED Final   Streptococcus species NOT DETECTED NOT DETECTED Final   Streptococcus agalactiae NOT DETECTED NOT DETECTED Final   Streptococcus pneumoniae NOT DETECTED NOT DETECTED Final   Streptococcus pyogenes NOT DETECTED NOT DETECTED Final   Acinetobacter baumannii NOT DETECTED NOT DETECTED Final   Enterobacteriaceae species NOT DETECTED NOT DETECTED Final   Enterobacter cloacae complex NOT DETECTED NOT DETECTED Final   Escherichia coli NOT  DETECTED NOT DETECTED Final   Klebsiella oxytoca NOT DETECTED NOT DETECTED Final   Klebsiella pneumoniae NOT DETECTED NOT DETECTED Final   Proteus species NOT DETECTED NOT DETECTED Final   Serratia marcescens NOT DETECTED NOT DETECTED Final   Carbapenem resistance NOT DETECTED NOT DETECTED Final   Haemophilus influenzae NOT DETECTED NOT DETECTED Final   Neisseria meningitidis NOT DETECTED NOT DETECTED Final   Pseudomonas aeruginosa NOT DETECTED NOT DETECTED Final   Candida albicans NOT DETECTED NOT DETECTED Final   Candida glabrata NOT DETECTED NOT DETECTED Final   Candida krusei NOT DETECTED NOT DETECTED Final   Candida parapsilosis NOT DETECTED NOT DETECTED Final   Candida tropicalis NOT DETECTED NOT  DETECTED Final    Comment: Performed at [facility], [address] [place], [place] [address]  Urine culture     Status: Abnormal   Collection Time: 01/21/17 12:15 PM  Result Value Ref Range Status   Specimen Description URINE, CLEAN CATCH  Final   Special Requests NONE  Final   Culture 70,000 COLONIES/mL STAPHYLOCOCCUS AUREUS (A)  Final   Report Status 01/23/2017 FINAL  Final   Organism ID, Bacteria STAPHYLOCOCCUS AUREUS (A)  Final      Susceptibility   Staphylococcus aureus - MIC*    CIPROFLOXACIN <=0.5 SENSITIVE Sensitive     GENTAMICIN <=0.5 SENSITIVE Sensitive     NITROFURANTOIN 32 SENSITIVE Sensitive     OXACILLIN <=0.25 SENSITIVE Sensitive     TETRACYCLINE <=1 SENSITIVE Sensitive     VANCOMYCIN <=0.5 SENSITIVE Sensitive     TRIMETH/SULFA <=10 SENSITIVE Sensitive     CLINDAMYCIN <=0.25 SENSITIVE Sensitive     RIFAMPIN <=0.5 SENSITIVE Sensitive     Inducible Clindamycin NEGATIVE Sensitive     * 70,000 COLONIES/mL STAPHYLOCOCCUS AUREUS  Surgical pcr screen     Status: Abnormal   Collection Time: 01/24/17  1:35 AM  Result Value Ref Range Status   MRSA, PCR NEGATIVE NEGATIVE Final   Staphylococcus aureus POSITIVE (A) NEGATIVE Final    Comment: (NOTE) The Xpert SA  Assay (FDA approved for NASAL specimens in patients 30 years of age and older), is one component of a comprehensive surveillance program. It is not intended to diagnose infection nor to guide or monitor treatment.     Studies/Results: No results found.   Assessment/Plan: Pacer pocket infection MSSA bacteremia MSSA in urine (most likely from blood)  Will send f/u [facility] To have pacer out.  Await TEE Continue ancef, duration depending on TEE.  Temps better   Total days of antibiotics: 3 (ancef)         Johny Sax Infectious Diseases (pager) [phone] [email or web address] 01/24/2017, 9:23 AM  LOS: 3 days

## 2017-01-24 NOTE — Consult Note (Signed)
           [facility] CM Primary Care Navigator  01/24/2017  Patrick Knox March 12, 1941 [id number]   Went to see patient at the bedside earlierto identify possible discharge needs but staff reports that heis off the unit in Endoscopy for a procedure at this time. (Transesophageal Echocardiogram)    Will attemptto see patient at another time when he is available in the room.      Addendum: (01/25/17)  Went back to see patient at the bedside earlier but he was off the unit for a procedure which was not done yesterday per staff. (TEE- Transesophageal Echocardiogram).  Will return back to meet with patient at another time when he is available in his room.     Addendum:   Went back to meet with patient in the room to identify possible discharge needs and was able to meet his wife Patrick Knox) and niece Patrick Knox) as well. Patient went off the unit again for a procedure (Permanent Pacemaker Generator Removal). Wife expressed willingness to provide information needed during this visit.  Went back to meet with patient after the procedure to confirm information provided by wife. Patient was dozing on and off during the visit.  Patient had presented with confusion/ altered mental status and weakness thatled to this admission.  Patient endorses Dr. Kirby Funk with [facility] at [facility] as theprimary care provider.   Patient is using Development worker, community on [employer], [employer] obtain medications without difficulty.   Patient reports that he has been managinghis medications at home straight out of the containers.   Per wife, patient has been driving prior to admission, however, son Patrick Knox), niece Patrick Knox) or grandson will work on schedules to provide transportation to his doctors'appointments after discharge. [employer] transportation benefits discussed. Patient declined offer for [facility] list of transportation resources as an alternative resource.   Patient's  wife is his primary caregiver at home as stated.   Anticipated discharge plan is skilled nursing facility (SNF) for rehabilitation per therapy recommendation.  Patient/ wife voiced understanding to call primary care provider's officewhen hereturns backhome,for a post discharge follow-up within a week or sooner if needs arise.Patient letter (with PCP's contact number) was provided as areminder.   Discussed with wife/ patient regarding [facility] CM services available for health management at home but both communicated no current needs or concerns for now. Patient/ wife expressed understanding to seekreferral from primary care provider to [facility] care management ifdeemed necessary and appropriatefor services in the future-when he returns back home.  [facility] care management information was provided for future needs that may arise.  Primary care provider's office listed as doing transition of care (TOC).  For questions, please contact:  Wyatt Haste, BSN, RN- [facility] Primary Care Navigator  Telephone: [phone] [facility]

## 2017-01-25 ENCOUNTER — Encounter (HOSPITAL_COMMUNITY)
Admission: EM | Disposition: A | Discharge status: Skilled Nursing Facility | Payer: Self-pay | Source: Home / Self Care | Attending: Family Medicine

## 2017-01-25 ENCOUNTER — Inpatient Hospital Stay (HOSPITAL_COMMUNITY): Payer: Medicare PPO

## 2017-01-25 DIAGNOSIS — I9589 Other hypotension: Secondary | ICD-10-CM

## 2017-01-25 DIAGNOSIS — T829XXA Unspecified complication of cardiac and vascular prosthetic device, implant and graft, initial encounter: Secondary | ICD-10-CM

## 2017-01-25 DIAGNOSIS — I340 Nonrheumatic mitral (valve) insufficiency: Secondary | ICD-10-CM

## 2017-01-25 DIAGNOSIS — R7881 Bacteremia: Secondary | ICD-10-CM

## 2017-01-25 HISTORY — PX: TEMPORARY PACEMAKER: CATH118268

## 2017-01-25 HISTORY — PX: TEE WITHOUT CARDIOVERSION: SHX5443

## 2017-01-25 HISTORY — PX: PPM GENERATOR REMOVAL: EP1234

## 2017-01-25 LAB — BLOOD CULTURE ID PANEL (REFLEXED)

## 2017-01-25 LAB — CBC
HCT: 36.5 % — ABNORMAL LOW (ref 39.0–52.0)
Hemoglobin: 12.9 g/dL — ABNORMAL LOW (ref 13.0–17.0)
MCH: 28.8 pg (ref 26.0–34.0)
MCHC: 35.3 g/dL (ref 30.0–36.0)
MCV: 81.5 fL (ref 78.0–100.0)
Platelets: 209 10*3/uL (ref 150–400)
RBC: 4.48 MIL/uL (ref 4.22–5.81)
RDW: 16.1 % — ABNORMAL HIGH (ref 11.5–15.5)
WBC: 15 10*3/uL — ABNORMAL HIGH (ref 4.0–10.5)

## 2017-01-25 LAB — PROTIME-INR
INR: 2.41
PROTHROMBIN TIME: 26 s — AB (ref 11.4–15.2)

## 2017-01-25 LAB — GLUCOSE, CAPILLARY: Glucose-Capillary: 133 mg/dL — ABNORMAL HIGH (ref 65–99)

## 2017-01-25 LAB — HEPARIN LEVEL (UNFRACTIONATED): Heparin Unfractionated: <0.1 IU/mL — ABNORMAL LOW (ref 0.30–0.70)

## 2017-01-25 SURGERY — PPM GENERATOR REMOVAL

## 2017-01-25 SURGERY — ECHOCARDIOGRAM, TRANSESOPHAGEAL
Anesthesia: Moderate Sedation

## 2017-01-25 MED ORDER — SODIUM CHLORIDE 0.9 % IV SOLN
250.0000 mL | INTRAVENOUS | Status: DC
  Administered 2017-01-25: 250 mL via INTRAVENOUS

## 2017-01-25 MED ORDER — CHLORHEXIDINE GLUCONATE 4 % EX LIQD
60.0000 mL | Freq: Once | CUTANEOUS | Status: DC
  Filled 2017-01-25: qty 60

## 2017-01-25 MED ORDER — SODIUM CHLORIDE 0.9% FLUSH
3.0000 mL | Freq: Two times a day (BID) | INTRAVENOUS | Status: DC
  Administered 2017-01-26 – 2017-02-04 (×11): 3 mL via INTRAVENOUS

## 2017-01-25 MED ORDER — LIDOCAINE HCL (PF) 1 % IJ SOLN
INTRAMUSCULAR | Status: AC
  Filled 2017-01-25: qty 60

## 2017-01-25 MED ORDER — BUTAMBEN-TETRACAINE-BENZOCAINE 2-2-14 % EX AERO
INHALATION_SPRAY | CUTANEOUS | Status: DC | PRN
  Administered 2017-01-25: 2 via TOPICAL

## 2017-01-25 MED ORDER — HEPARIN (PORCINE) IN NACL 2-0.9 UNIT/ML-% IJ SOLN
INTRAMUSCULAR | Status: AC
  Filled 2017-01-25: qty 500

## 2017-01-25 MED ORDER — CHLORHEXIDINE GLUCONATE 4 % EX LIQD
60.0000 mL | Freq: Once | CUTANEOUS | Status: AC
  Administered 2017-01-25: 4 via TOPICAL
  Filled 2017-01-25: qty 60

## 2017-01-25 MED ORDER — SODIUM CHLORIDE 0.9 % IR SOLN
Status: DC | PRN
  Administered 2017-01-25: 15:00:00

## 2017-01-25 MED ORDER — SODIUM CHLORIDE 0.9% FLUSH
3.0000 mL | INTRAVENOUS | Status: DC | PRN
  Administered 2017-01-29: 3 mL via INTRAVENOUS
  Filled 2017-01-25: qty 3

## 2017-01-25 MED ORDER — FENTANYL CITRATE (PF) 100 MCG/2ML IJ SOLN
INTRAMUSCULAR | Status: AC
  Filled 2017-01-25: qty 2

## 2017-01-25 MED ORDER — SODIUM CHLORIDE 0.9 % IV SOLN
INTRAVENOUS | Status: DC
  Administered 2017-01-25: 13:00:00 via INTRAVENOUS

## 2017-01-25 MED ORDER — SODIUM CHLORIDE 0.9 % IV SOLN: 250.0000 mL | INTRAVENOUS | Status: DC | PRN

## 2017-01-25 MED ORDER — SODIUM CHLORIDE 0.9% FLUSH
3.0000 mL | Freq: Two times a day (BID) | INTRAVENOUS | Status: DC
  Administered 2017-01-25: 3 mL via INTRAVENOUS

## 2017-01-25 MED ORDER — CEFAZOLIN SODIUM-DEXTROSE 2-4 GM/100ML-% IV SOLN
2.0000 g | INTRAVENOUS | Status: AC
  Administered 2017-01-25: 2 g via INTRAVENOUS

## 2017-01-25 MED ORDER — ACETAMINOPHEN 325 MG PO TABS: 325.0000 mg | ORAL_TABLET | ORAL | Status: DC | PRN

## 2017-01-25 MED ORDER — SODIUM CHLORIDE 0.9 % IR SOLN
Status: AC
  Filled 2017-01-25 (×2): qty 2

## 2017-01-25 MED ORDER — FENTANYL CITRATE (PF) 100 MCG/2ML IJ SOLN
INTRAMUSCULAR | Status: DC | PRN
Start: 2017-01-25 — End: 2017-01-25
  Administered 2017-01-25 (×2): 12.5 ug via INTRAVENOUS
  Administered 2017-01-25: 25 ug via INTRAVENOUS
  Administered 2017-01-25: 12.5 ug via INTRAVENOUS

## 2017-01-25 MED ORDER — HYDROCODONE-ACETAMINOPHEN 5-325 MG PO TABS
1.0000 | ORAL_TABLET | ORAL | Status: DC | PRN
  Administered 2017-01-29 – 2017-01-30 (×2): 2 via ORAL
  Administered 2017-01-31 – 2017-02-04 (×5): 1 via ORAL
  Filled 2017-01-25: qty 1
  Filled 2017-01-25 (×2): qty 2
  Filled 2017-01-25 (×4): qty 1

## 2017-01-25 MED ORDER — MIDAZOLAM HCL 10 MG/2ML IJ SOLN
INTRAMUSCULAR | Status: DC | PRN
  Administered 2017-01-25 (×2): 1 mg via INTRAVENOUS
  Administered 2017-01-25: 2 mg via INTRAVENOUS
  Administered 2017-01-25: 1 mg via INTRAVENOUS

## 2017-01-25 MED ORDER — MIDAZOLAM HCL 5 MG/ML IJ SOLN
INTRAMUSCULAR | Status: AC
  Filled 2017-01-25: qty 2

## 2017-01-25 MED ORDER — HEPARIN (PORCINE) IN NACL 2-0.9 UNIT/ML-% IJ SOLN
INTRAMUSCULAR | Status: AC | PRN
  Administered 2017-01-25: 500 mL

## 2017-01-25 MED ORDER — CEFAZOLIN SODIUM-DEXTROSE 2-4 GM/100ML-% IV SOLN
INTRAVENOUS | Status: AC
  Filled 2017-01-25: qty 100

## 2017-01-25 MED ORDER — LIDOCAINE HCL (PF) 1 % IJ SOLN
INTRAMUSCULAR | Status: DC | PRN
  Administered 2017-01-25 (×2): 25 mL via INTRADERMAL

## 2017-01-25 MED ORDER — ONDANSETRON HCL 4 MG/2ML IJ SOLN: 4.0000 mg | Freq: Four times a day (QID) | INTRAMUSCULAR | Status: DC | PRN

## 2017-01-25 MED ORDER — SODIUM CHLORIDE 0.9 % IR SOLN
80.0000 mg | Status: AC
  Administered 2017-01-25: 80 mg

## 2017-01-25 MED ORDER — SODIUM CHLORIDE 0.9% FLUSH: 3.0000 mL | INTRAVENOUS | Status: DC | PRN

## 2017-01-25 SURGICAL SUPPLY — 7 items
CABLE SURGICAL S-101-97-12 (CABLE) ×1 IMPLANT
DILATOR VESSEL 38 20CM 8FR (INTRODUCER) ×1 IMPLANT
LEAD TENDRIL SDX 2088TC-58CM (Lead) ×1 IMPLANT
PAD DEFIB LIFELINK (PAD) ×1 IMPLANT
SHEATH CLASSIC 7F (SHEATH) ×1 IMPLANT
TRAY PACEMAKER INSERTION (PACKS) ×1 IMPLANT
WIRE MAILMAN 182CM (WIRE) ×1 IMPLANT

## 2017-01-25 NOTE — Interval H&P Note (Signed)
History and Physical Interval Note:  01/25/2017 1:45 PM  [name]  has presented today for surgery, with the diagnosis of hb  The various methods of treatment have been discussed with the patient and family. After consideration of risks, benefits and other options for treatment, the patient has consented to  Procedure(s): PPM GENERATOR REMOVAL (N/A) Temporary Pacemaker (N/A) as a surgical intervention .  The patient's history has been reviewed, patient examined, no change in status, stable for surgery.  I have reviewed the patient's chart and labs.  Questions were answered to the patient's satisfaction.     Hillis Range

## 2017-01-25 NOTE — Progress Notes (Signed)
Progress Note  Patient Name: Patrick Knox Date of Encounter: 01/25/2017  Primary Cardiologist: Jens Som  Subjective   S/p TEE, remains sleepy but easily woken, denies any complaints  Inpatient Medications    Scheduled Meds: . [MAR Hold] carvedilol  3.125 mg Oral BID  . [MAR Hold] polyethylene glycol  17 g Oral Daily  . [MAR Hold] senna-docusate  1 tablet Oral BID   Continuous Infusions: . [MAR Hold]  ceFAZolin (ANCEF) IV Stopped (01/25/17 0658)   PRN Meds: [MAR Hold] acetaminophen **OR** [MAR Hold] acetaminophen, butamben-tetracaine-benzocaine, fentaNYL, midazolam, [MAR Hold]  morphine injection, [MAR Hold] ondansetron **OR** [MAR Hold] ondansetron (ZOFRAN) IV   Vital Signs    Vitals:   01/25/17 0414 01/25/17 0806 01/25/17 0855 01/25/17 0857  BP: 113/78 112/70 112/72   Pulse: 75 74 66 61  Resp: Temp: 98.1 F (36.7 C) 98.1 F (36.7 C) 98.2 F (36.8 C)   TempSrc: Oral Oral Oral   SpO2: 100% 100% 97% 97%  Weight:   150 lb (68 kg)   Height:    (1.854 m)     Intake/Output Summary (Last 24 hours) at 01/25/17 1037 Last data filed at 01/25/17 0602  Gross per 24 hour  Intake              360 ml  Output              500 ml  Net             -140 ml   Filed Weights   01/24/17 0500 01/24/17 2144 01/25/17 0855  Weight: 149 lb 14.6 oz (68 kg) 150 lb 5.7 oz (68.2 kg) 150 lb (68 kg)    Telemetry    AV pacing - Personally Reviewed  ECG    No new EKGs - Personally Reviewed  Physical Exam   GEN: No acute distress.  Thin and frail appearing, ill, alert today Neck: 7 cm JVD Cardiac: RRR, no murmurs, rubs, or gallops.  Respiratory: CTA b/l. Left PM dressing in place, previously described (incision is open and leads can be visualized) GI: Soft, nontender, non-distended  MS: No edema; No deformity. Neuro:   Nonfocal , he is sleepy s/p sedation, wakes easily, oriented to self, knows he is in the hospital Psych: Normal affect   Labs     Chemistry  Recent Labs Lab 01/21/17 1030  01/22/17 0509 01/23/17 0324 01/24/17 0605  NA 127*  < > 132* 136 133*  K 4.4  < > 3.7 3.8 4.1  CL 97*  < > 107 107 109  CO2 18*  < > 14* 21* 16*  GLUCOSE 192*  < > 135* 142* 149*  BUN 84*  < > 77* 68* 61*  CREATININE 3.40*  < > 2.60* 2.18* 1.88*  CALCIUM 9.1  < > 8.7* 8.8* 8.4*  PROT 8.1  --   --   --  5.8*  ALBUMIN 3.3*  --   --   --  2.0*  AST 173*  --   --   --  78*  ALT 83*  --   --   --  29  ALKPHOS 54  --   --   --  47  BILITOT 1.5*  --   --   --  0.9  GFRNONAA 16*  < > 23* 28* 33*  GFRAA 19*  < > 26* 32* 39*  ANIONGAP 12  < > < > = values  in this interval not displayed.   Hematology  Recent Labs Lab 01/23/17 0324 01/24/17 0605 01/25/17 0213  WBC 11.7* 15.1* 15.0*  RBC 4.76 4.68 4.48  HGB 13.6 13.3 12.9*  HCT 39.7 37.9* 36.5*  MCV 83.4 81.0 81.5  MCH 28.6 28.4 28.8  MCHC 34.3 35.1 35.3  RDW 15.1 15.5 16.1*  PLT 183 160 209    Cardiac Enzymes  Recent Labs Lab 01/21/17 1530 01/21/17 2329 01/22/17 0509 01/22/17 0925  TROPONINI 0.18* 0.17* 0.15* 0.13*   No results for input(s): TROPIPOC in the last 168 hours.   BNP  Recent Labs Lab 01/21/17 2329  BNP 764.7*     DDimer No results for input(s): DDIMER in the last 168 hours.   Radiology    No results found.  Cardiac Studies   Blood cultures now growing out Staph  Patient Profile     76 y.o. male admitted with altered mental status, growing out staph aureus now, with drainage from PM pocket. He was hypotensive and this has resolved. His renal function has improved.  Assessment & Plan    1. MSSA with PM pocket infection      SJM BiVe pacer implanted 02/18/16     staph UTI as well     he is improved clinically with IV anti-biotics.     For BiVe pacing system extraction, and temp-perm implant     Appreciate ID, antibiotics as per their recommendations     TEE today     No fever last 48 hours     WBC increased from yesterday to 15.0      Device check yestery with pacing at 30bpm  Unfortunately consent was unable to be obtained until late day by patient's wife yesterday and both procedures delayed. TEE is this AM Will need to keep an eye on today's EP schedule, no clear available slot at this time, perhaps tomorrow. Keep NPO post TEE  I discussed with the patient's RN, plans for procedure this afternoon,   2. CHB      Will need temp-pes placement until cleared for re-implant of permanent device by ID     This will depend on TEE findings  3. Chronic systolic heart failure        he appears to be well compensated.   4. Paroxysmal Afib     CHA2DS2Vasc is 4, on heparin gtt     INR is 2.4 today     Hold heparin gtt this AM  5. CAD (remote CABG)     No anginal complaints   For questions or updates, please contact [facility] Please consult [email or web address] for contact info under Cardiology/STEMI.        Signed, Sheilah Pigeon, PA-C  01/25/2017, 10:37 AM  Patient ID: Patrick Knox, male   DOB: May 29, 1940, 76 y.o.   MRN: [id number]      I have seen, examined the patient, and reviewed the above assessment and plan. On exam, frail and ill appearing.  More alert today.  TEE is reviewed with Dr Anne Fu.  A small lead vegetation is noted.  Changes to above are made where necessary.  Will plan to proceed with BiV pacemaker system removal.  I have discussed at length with Drs Graciela Husbands and Ladona Ridgel.  As the system has been in for less than a year, we all agree that this is a safe procedure for me to perform in the EP lab and not by Dr Ladona Ridgel in the OR.  Dr Ladona Ridgel has agreed  to be available for backup if needed.  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   I would therefore recommend pacemaker system removal at this time with temp/perm pacing lead placed.  Will likely require IV antibiotics for several weeks before reimplantation.  Risks, benefits, alternatives to BiV pacemaker system removal were  discussed in detail with the patient, his wife, and his daughter today. They understand that the risks include but are not limited to bleeding, infection, pneumothorax, perforation, tamponade, vascular damage, renal failure, MI, stroke, death, and lead dislodgement and wishes to proceed at this time.   Co Sign: Hillis Range, MD 01/25/2017 1:41 PM

## 2017-01-25 NOTE — Progress Notes (Signed)
Attempted to call patients wife. No answer. Will continue to monitor.

## 2017-01-25 NOTE — Progress Notes (Signed)
PHARMACY - PHYSICIAN COMMUNICATION CRITICAL VALUE ALERT - BLOOD CULTURE IDENTIFICATION (BCID)  Results for orders placed or performed during the hospital encounter of 01/21/17  Blood Culture ID Panel (Reflexed) (Collected: 01/24/2017  9:45 AM)  Result Value Ref Range   Enterococcus species NOT DETECTED NOT DETECTED   Listeria monocytogenes NOT DETECTED NOT DETECTED   Staphylococcus species DETECTED (A) NOT DETECTED   Staphylococcus aureus DETECTED (A) NOT DETECTED   Methicillin resistance NOT DETECTED NOT DETECTED   Streptococcus species NOT DETECTED NOT DETECTED   Streptococcus agalactiae NOT DETECTED NOT DETECTED   Streptococcus pneumoniae NOT DETECTED NOT DETECTED   Streptococcus pyogenes NOT DETECTED NOT DETECTED   Acinetobacter baumannii NOT DETECTED NOT DETECTED   Enterobacteriaceae species NOT DETECTED NOT DETECTED   Enterobacter cloacae complex NOT DETECTED NOT DETECTED   Escherichia coli NOT DETECTED NOT DETECTED   Klebsiella oxytoca NOT DETECTED NOT DETECTED   Klebsiella pneumoniae NOT DETECTED NOT DETECTED   Proteus species NOT DETECTED NOT DETECTED   Serratia marcescens NOT DETECTED NOT DETECTED   Haemophilus influenzae NOT DETECTED NOT DETECTED   Neisseria meningitidis NOT DETECTED NOT DETECTED   Pseudomonas aeruginosa NOT DETECTED NOT DETECTED   Candida albicans NOT DETECTED NOT DETECTED   Candida glabrata NOT DETECTED NOT DETECTED   Candida krusei NOT DETECTED NOT DETECTED   Candida parapsilosis NOT DETECTED NOT DETECTED   Candida tropicalis NOT DETECTED NOT DETECTED    Name of physician (or Provider) Contacted: ID  Changes to prescribed antibiotics required: repeat blood cx positive as well continue current therapy  Bertram Millard 01/25/2017  1:06 PM

## 2017-01-25 NOTE — Progress Notes (Signed)
[facility]  [facility] Infusion Coordinator will follow mr. Rajan during this hospitalization with ID team to support IV ABX at DC as ordered.  If patient discharges after hours, please call [phone].   Patrick Knox 01/25/2017, 9:01 AM

## 2017-01-25 NOTE — Interval H&P Note (Signed)
History and Physical Interval Note:  01/25/2017 10:53 AM  Patrick Knox  has presented today for surgery, with the diagnosis of bacteremia  The various methods of treatment have been discussed with the patient and family. After consideration of risks, benefits and other options for treatment, the patient has consented to  Procedure(s): TRANSESOPHAGEAL ECHOCARDIOGRAM (TEE) (N/A) as a surgical intervention .  The patient's history has been reviewed, patient examined, no change in status, stable for surgery.  I have reviewed the patient's chart and labs.  Questions were answered to the patient's satisfaction.     [employer]

## 2017-01-25 NOTE — H&P (View-Only) (Signed)
 Progress Note  Patient Name: Patrick Knox Date of Encounter: 01/25/2017  Primary Cardiologist: crenshaw  Subjective   S/p TEE, remains sleepy but easily woken, denies any complaints  Inpatient Medications    Scheduled Meds: . [MAR Hold] carvedilol  3.125 mg Oral BID  . [MAR Hold] polyethylene glycol  17 g Oral Daily  . [MAR Hold] senna-docusate  1 tablet Oral BID   Continuous Infusions: . [MAR Hold]  ceFAZolin (ANCEF) IV Stopped (01/25/17 0658)   PRN Meds: [MAR Hold] acetaminophen **OR** [MAR Hold] acetaminophen, butamben-tetracaine-benzocaine, fentaNYL, midazolam, [MAR Hold]  morphine injection, [MAR Hold] ondansetron **OR** [MAR Hold] ondansetron (ZOFRAN) IV   Vital Signs    Vitals:   01/25/17 0414 01/25/17 0806 01/25/17 0855 01/25/17 0857  BP: 113/78 112/70 112/72   Pulse: 75 74 66 61  Resp: 18 18 18 20  Temp: 98.1 F (36.7 C) 98.1 F (36.7 C) 98.2 F (36.8 C)   TempSrc: Oral Oral Oral   SpO2: 100% 100% 97% 97%  Weight:   150 lb (68 kg)   Height:   6' 1" (1.854 m)     Intake/Output Summary (Last 24 hours) at 01/25/17 1037 Last data filed at 01/25/17 0602  Gross per 24 hour  Intake              360 ml  Output              500 ml  Net             -140 ml   Filed Weights   01/24/17 0500 01/24/17 2144 01/25/17 0855  Weight: 149 lb 14.6 oz (68 kg) 150 lb 5.7 oz (68.2 kg) 150 lb (68 kg)    Telemetry    AV pacing - Personally Reviewed  ECG    No new EKGs - Personally Reviewed  Physical Exam   GEN: No acute distress.  Thin and frail appearing, ill, alert today Neck: 7 cm JVD Cardiac: RRR, no murmurs, rubs, or gallops.  Respiratory: CTA b/l. Left PM dressing in place, previously described (incision is open and leads can be visualized) GI: Soft, nontender, non-distended  MS: No edema; No deformity. Neuro:   Nonfocal , he is sleepy s/p sedation, wakes easily, oriented to self, knows he is in the hospital Psych: Normal affect   Labs     Chemistry  Recent Labs Lab 01/21/17 1030  01/22/17 0509 01/23/17 0324 01/24/17 0605  NA 127*  < > 132* 136 133*  K 4.4  < > 3.7 3.8 4.1  CL 97*  < > 107 107 109  CO2 18*  < > 14* 21* 16*  GLUCOSE 192*  < > 135* 142* 149*  BUN 84*  < > 77* 68* 61*  CREATININE 3.40*  < > 2.60* 2.18* 1.88*  CALCIUM 9.1  < > 8.7* 8.8* 8.4*  PROT 8.1  --   --   --  5.8*  ALBUMIN 3.3*  --   --   --  2.0*  AST 173*  --   --   --  78*  ALT 83*  --   --   --  29  ALKPHOS 54  --   --   --  47  BILITOT 1.5*  --   --   --  0.9  GFRNONAA 16*  < > 23* 28* 33*  GFRAA 19*  < > 26* 32* 39*  ANIONGAP 12  < > 11 8 8  < > = values   in this interval not displayed.   Hematology  Recent Labs Lab 01/23/17 0324 01/24/17 0605 01/25/17 0213  WBC 11.7* 15.1* 15.0*  RBC 4.76 4.68 4.48  HGB 13.6 13.3 12.9*  HCT 39.7 37.9* 36.5*  MCV 83.4 81.0 81.5  MCH 28.6 28.4 28.8  MCHC 34.3 35.1 35.3  RDW 15.1 15.5 16.1*  PLT 183 160 209    Cardiac Enzymes  Recent Labs Lab 01/21/17 1530 01/21/17 2329 01/22/17 0509 01/22/17 0925  TROPONINI 0.18* 0.17* 0.15* 0.13*   No results for input(s): TROPIPOC in the last 168 hours.   BNP  Recent Labs Lab 01/21/17 2329  BNP 764.7*     DDimer No results for input(s): DDIMER in the last 168 hours.   Radiology    No results found.  Cardiac Studies   Blood cultures now growing out Staph  Patient Profile     76 y.o. male admitted with altered mental status, growing out staph aureus now, with drainage from PM pocket. He was hypotensive and this has resolved. His renal function has improved.  Assessment & Plan    1. MSSA with PM pocket infection      SJM BiVe pacer implanted 02/18/16     staph UTI as well     he is improved clinically with IV anti-biotics.     For BiVe pacing system extraction, and temp-perm implant     Appreciate ID, antibiotics as per their recommendations     TEE today     No fever last 48 hours     WBC increased from yesterday to 15.0      Device check yestery with pacing at 30bpm  Unfortunately consent was unable to be obtained until late day by patient's wife yesterday and both procedures delayed. TEE is this AM Will need to keep an eye on today's EP schedule, no clear available slot at this time, perhaps tomorrow. Keep NPO post TEE  I discussed with the patient's RN, plans for procedure this afternoon,   2. CHB      Will need temp-pes placement until cleared for re-implant of permanent device by ID     This will depend on TEE findings  3. Chronic systolic heart failure        he appears to be well compensated.   4. Paroxysmal Afib     CHA2DS2Vasc is 4, on heparin gtt     INR is 2.4 today     Hold heparin gtt this AM  5. CAD (remote CABG)     No anginal complaints   For questions or updates, please contact [facility] Please consult [email or web address] for contact info under Cardiology/STEMI.        Signed, Renee Lynn Ursuy, PA-C  01/25/2017, 10:37 AM  Patient ID: Patrick Knox, male   DOB: 12/31/1940, 76 y.o.   MRN: [id number]      I have seen, examined the patient, and reviewed the above assessment and plan. On exam, frail and ill appearing.  More alert today.  TEE is reviewed with Dr Skains.  A small lead vegetation is noted.  Changes to above are made where necessary.  Will plan to proceed with BiV pacemaker system removal.  I have discussed at length with Drs Klein and Taylor.  As the system has been in for less than a year, we all agree that this is a safe procedure for me to perform in the EP lab and not by Dr Taylor in the OR.  Dr Taylor has agreed   to be available for backup if needed.  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   I would therefore recommend pacemaker system removal at this time with temp/perm pacing lead placed.  Will likely require IV antibiotics for several weeks before reimplantation.  Risks, benefits, alternatives to BiV pacemaker system removal were  discussed in detail with the patient, his wife, and his daughter today. They understand that the risks include but are not limited to bleeding, infection, pneumothorax, perforation, tamponade, vascular damage, renal failure, MI, stroke, death, and lead dislodgement and wishes to proceed at this time.   Co Sign: Floetta Brickey, MD 01/25/2017 1:41 PM    

## 2017-01-25 NOTE — H&P (View-Only) (Signed)
PROGRESS NOTE    Rayburn Harmening   MRN:[id number]  DOB: 04/01/1941  DOA: 01/21/2017 PCP: Griffin, John, MD   Brief Narrative:  Flem Whiteman 76 y.o. male with PPM, history of CAD with CABG, HTN, sCHF, A-fib on Coumadin, complete heart block with pacemaker, CKD 3, tobacco abuse presents from home with confusion and weakness. Apparently also started Flexeril recently. Found to have BP 86/46, sodium 127, exposed pacer wires and and pus coming out of pacemaker pocket. Blood cultures 2/2 for MSSA.   Subjective: Sleepy this AM. Will not awaken to talk to me    Assessment & Plan:   Principal Problem:   Sepsis, MSSA bacteremia and in UA (likely due to hematogenic spread) -  fevers / encephalopathic - pacer is infected, leads exposed- to be removed today- TTE also to be done today- cardiology following - cont ancef  - appreciate ID follow up  Active Problems:   Acute metabolic encephalopathy - likely due to infection- follow    Paroxysmal atrial fibrillation (HCC) - resumed low dose of Coreg for now to prevent RVR - Heparin    Hypertension - Coreg- lower than home dose for now- holding Lisinopril    Acute renal failure superimposed on stage 3 chronic kidney disease (HCC) - baseline Cr 1.1 - admitted with Cr 3.4- likely prerenal and ATN in setting of ACE I and sepsis - steadily improving - d/c IVF 9/23- appeared to be eating/drinking well and had good urine output- cont to follow - Cr better today   Metabolic acidosis - improving- likely from AKI and sepsis    Chronic systolic CHF (congestive heart failure) (HCC) - ECHO this admission: EF 35-40% with focal hypokinesis, grade 1 dCHF - stop IVF today- follow daily weights - not on chronic diuretics    Hypotension/ hyponatremia - resolved with fluid replacement  Mildly elevated troponin - likely due to sepsis/ hypotension    Tobacco abuse - counseling - states he quit smoking about 2 wks prior to admission- d/c  patch  DVT prophylaxis: Heparin Code Status: Full code Family Communication:  Disposition Plan: to be determined Consultants:   Cardiology  ID Procedures:  2 D ECHO Study Conclusions  - Left ventricle: The cavity size was mildly dilated. Systolic   function was moderately reduced. The estimated ejection fraction   was in the range of 35% to 40%. Diffuse hypokinesis. Dyskinesis   of the basal inferolateral myocardium. Akinesis of the basal   inferior myocardium. Doppler parameters are consistent with   abnormal left ventricular relaxation (grade 1 diastolic   dysfunction). - Aortic valve: Transvalvular velocity was within the normal range.   There was no stenosis. There was no regurgitation. - Mitral valve: Transvalvular velocity was within the normal range.   There was no evidence for stenosis. There was trivial   regurgitation. - Right ventricle: The cavity size was normal. Wall thickness was   normal. Systolic function was mildly reduced. - Tricuspid valve: There was mild regurgitation. - Pulmonary arteries: Systolic pressure was within the normal   range. PA peak pressure: 27 mm Hg (S).  Antimicrobials:  Anti-infectives    Start     Dose/Rate Route Frequency Ordered Stop   01/24/17 1030  gentamicin (GARAMYCIN) 80 mg in sodium chloride irrigation 0.9 % 500 mL irrigation     80 mg Irrigation To Cath Lab 01/24/17 0944 01/25/17 1030   01/24/17 0945  ceFAZolin (ANCEF) IVPB 2g/100 mL premix     2 g 200 mL/hr over 30   Minutes Intravenous On call 01/24/17 0944 01/25/17 0945   01/23/17 1600  [MAR Hold]  ceFAZolin (ANCEF) IVPB 2g/100 mL premix     (MAR Hold since 01/24/17 1229)   2 g 200 mL/hr over 30 Minutes Intravenous Every 8 hours 01/23/17 1331     01/22/17 1100  ceFAZolin (ANCEF) IVPB 2g/100 mL premix  Status:  Discontinued     2 g 200 mL/hr over 30 Minutes Intravenous Every 12 hours 01/22/17 1046 01/23/17 1331   01/22/17 0500  vancomycin (VANCOCIN) IVPB 750 mg/150 ml  premix  Status:  Discontinued     750 mg 150 mL/hr over 60 Minutes Intravenous Every 24 hours 01/21/17 1107 01/22/17 1030   01/22/17 0000  piperacillin-tazobactam (ZOSYN) IVPB 2.25 g  Status:  Discontinued     2.25 g 100 mL/hr over 30 Minutes Intravenous Every 8 hours 01/21/17 2305 01/22/17 1030   01/21/17 1115  vancomycin (VANCOCIN) IVPB 1000 mg/200 mL premix     1,000 mg 200 mL/hr over 60 Minutes Intravenous  Once 01/21/17 1104 01/21/17 1257   01/21/17 1100  piperacillin-tazobactam (ZOSYN) IVPB 3.375 g     3.375 g 100 mL/hr over 30 Minutes Intravenous  Once 01/21/17 1051 01/21/17 1150       Objective: Vitals:   01/24/17 0656 01/24/17 0740 01/24/17 0938 01/24/17 1234  BP: 102/62 111/64 118/65 110/72  Pulse: 68 64 62 62  Resp: 15 18  17  Temp: 98.3 F (36.8 C) 98.3 F (36.8 C) 97.6 F (36.4 C) 98.3 F (36.8 C)  TempSrc:  Oral Oral Oral  SpO2: 100% 99%  97%  Weight:      Height:        Intake/Output Summary (Last 24 hours) at 01/24/17 1349 Last data filed at 01/24/17 0900  Gross per 24 hour  Intake           894.88 ml  Output              950 ml  Net           -55.12 ml   Filed Weights   01/23/17 0209 01/23/17 2017 01/24/17 0500  Weight: 68 kg (149 lb 14.6 oz) 68 kg (149 lb 14.6 oz) 68 kg (149 lb 14.6 oz)    Examination: General exam: Appears comfortable - somnolent HEENT: PERRLA, oral mucosa moist, no sclera icterus or thrush Respiratory system: Clear to auscultation. Respiratory effort normal. Cardiovascular system: S1 & S2 heard, RRR.  No murmurs  Gastrointestinal system: Abdomen soft, non-tender, nondistended. Normal bowel sound. No organomegaly Central nervous system:   No focal neurological deficits. Extremities: No cyanosis, clubbing or edema Skin:   ulcer in skin above pacemaker Psychiatry:  Difficult to assess as sleepy    Data Reviewed: I have personally reviewed following labs and imaging studies  CBC:  Recent Labs Lab 01/21/17 1030  01/22/17 0509 01/23/17 0324 01/24/17 0605  WBC 11.4* 11.6* 11.7* 15.1*  NEUTROABS 9.8*  --   --   --   HGB 14.3 13.8 13.6 13.3  HCT 41.0 39.2 39.7 37.9*  MCV 83.5 82.7 83.4 81.0  PLT 193 161 183 160   Basic Metabolic Panel:  Recent Labs Lab 01/21/17 1030 01/21/17 2329 01/22/17 0509 01/23/17 0324 01/24/17 0605  NA 127* 131* 132* 136 133*  K 4.4 4.1 3.7 3.8 4.1  CL 97* 103 107 107 109  CO2 18* 15* 14* 21* 16*  GLUCOSE 192* 142* 135* 142* 149*  BUN 84* 75* 77* 68* 61*  CREATININE   3.40* 2.65* 2.60* 2.18* 1.88*  CALCIUM 9.1 9.0 8.7* 8.8* 8.4*   GFR: Estimated Creatinine Clearance: 32.7 mL/min (A) (by C-G formula based on SCr of 1.88 mg/dL (H)). Liver Function Tests:  Recent Labs Lab 01/21/17 1030 01/24/17 0605  AST 173* 78*  ALT 83* 29  ALKPHOS 54 47  BILITOT 1.5* 0.9  PROT 8.1 5.8*  ALBUMIN 3.3* 2.0*   No results for input(s): LIPASE, AMYLASE in the last 168 hours. No results for input(s): AMMONIA in the last 168 hours. Coagulation Profile:  Recent Labs Lab 01/21/17 1030 01/22/17 0509 01/23/17 0324 01/24/17 0605  INR 1.80 1.92 2.01 2.02   Cardiac Enzymes:  Recent Labs Lab 01/21/17 1030 01/21/17 1530 01/21/17 2329 01/22/17 0509 01/22/17 0925  TROPONINI 0.18* 0.18* 0.17* 0.15* 0.13*   BNP (last 3 results) No results for input(s): PROBNP in the last 8760 hours. HbA1C:  Recent Labs  01/22/17 0509  HGBA1C 5.9*   CBG:  Recent Labs Lab 01/21/17 2127 01/22/17 0834 01/23/17 0738 01/24/17 0737  GLUCAP 143* 161* 126* 156*   Lipid Profile:  Recent Labs  01/22/17 0509  CHOL 104  HDL 11*  LDLCALC 55  TRIG 190*  CHOLHDL 9.5   Thyroid Function Tests:  Recent Labs  01/22/17 0509  TSH 1.867   Anemia Panel: No results for input(s): VITAMINB12, FOLATE, FERRITIN, TIBC, IRON, RETICCTPCT in the last 72 hours. Urine analysis:    Component Value Date/Time   COLORURINE YELLOW 01/21/2017 1215   APPEARANCEUR CLOUDY (A) 01/21/2017 1215    LABSPEC 1.025 01/21/2017 1215   PHURINE 5.5 01/21/2017 1215   GLUCOSEU NEGATIVE 01/21/2017 1215   HGBUR LARGE (A) 01/21/2017 1215   BILIRUBINUR NEGATIVE 01/21/2017 1215   KETONESUR NEGATIVE 01/21/2017 1215   PROTEINUR >300 (A) 01/21/2017 1215   NITRITE NEGATIVE 01/21/2017 1215   LEUKOCYTESUR NEGATIVE 01/21/2017 1215   Sepsis Labs: @LABRCNTIP(procalcitonin:4,lacticidven:4) ) Recent Results (from the past 240 hour(s))  Culture, blood (routine x 2)     Status: Abnormal   Collection Time: 01/21/17 10:50 AM  Result Value Ref Range Status   Specimen Description BLOOD LEFT FOREARM  Final   Special Requests   Final    BOTTLES DRAWN AEROBIC AND ANAEROBIC Blood Culture adequate volume   Culture  Setup Time   Final    GRAM POSITIVE COCCI IN CLUSTERS IN BOTH AEROBIC AND ANAEROBIC BOTTLES CRITICAL VALUE NOTED.  VALUE IS CONSISTENT WITH PREVIOUSLY REPORTED AND CALLED VALUE.    Culture (A)  Final    STAPHYLOCOCCUS AUREUS SUSCEPTIBILITIES PERFORMED ON PREVIOUS CULTURE WITHIN THE LAST 5 DAYS. Performed at [facility], [address] [place], [place] [address]    Report Status 01/24/2017 FINAL  Final  Culture, blood (routine x 2)     Status: Abnormal   Collection Time: 01/21/17 11:08 AM  Result Value Ref Range Status   Specimen Description BLOOD LEFT WRIST  Final   Special Requests   Final    BOTTLES DRAWN AEROBIC AND ANAEROBIC Blood Culture adequate volume   Culture  Setup Time   Final    GRAM POSITIVE COCCI IN CLUSTERS IN BOTH AEROBIC AND ANAEROBIC BOTTLES CRITICAL RESULT CALLED TO, READ BACK BY AND VERIFIED WITH: K.COOK PHARMD 01/22/17 0408 L.CHAMPION Performed at [facility], [address] [place], [place] [address]    Culture STAPHYLOCOCCUS AUREUS (A)  Final   Report Status 01/24/2017 FINAL  Final   Organism ID, Bacteria STAPHYLOCOCCUS AUREUS  Final      Susceptibility     Staphylococcus aureus - MIC*    CIPROFLOXACIN <=0.5 SENSITIVE Sensitive     ERYTHROMYCIN <=0.25  SENSITIVE Sensitive     GENTAMICIN <=0.5 SENSITIVE Sensitive     OXACILLIN 0.5 SENSITIVE Sensitive     TETRACYCLINE <=1 SENSITIVE Sensitive     VANCOMYCIN <=0.5 SENSITIVE Sensitive     TRIMETH/SULFA <=10 SENSITIVE Sensitive     CLINDAMYCIN <=0.25 SENSITIVE Sensitive     RIFAMPIN <=0.5 SENSITIVE Sensitive     Inducible Clindamycin NEGATIVE Sensitive     * STAPHYLOCOCCUS AUREUS  Blood Culture ID Panel (Reflexed)     Status: Abnormal   Collection Time: 01/21/17 11:08 AM  Result Value Ref Range Status   Enterococcus species NOT DETECTED NOT DETECTED Final   Vancomycin resistance NOT DETECTED NOT DETECTED Final   Listeria monocytogenes NOT DETECTED NOT DETECTED Final   Staphylococcus species DETECTED (A) NOT DETECTED Final    Comment: CRITICAL RESULT CALLED TO, READ BACK BY AND VERIFIED WITH: K.COOK PHARMD 01/22/17 0408 L.CHAMPION    Staphylococcus aureus DETECTED (A) NOT DETECTED Final    Comment: CRITICAL RESULT CALLED TO, READ BACK BY AND VERIFIED WITH: K.COOK PHARMD 01/22/17 0408 L.CHAMPION    Methicillin resistance NOT DETECTED NOT DETECTED Final   Streptococcus species NOT DETECTED NOT DETECTED Final   Streptococcus agalactiae NOT DETECTED NOT DETECTED Final   Streptococcus pneumoniae NOT DETECTED NOT DETECTED Final   Streptococcus pyogenes NOT DETECTED NOT DETECTED Final   Acinetobacter baumannii NOT DETECTED NOT DETECTED Final   Enterobacteriaceae species NOT DETECTED NOT DETECTED Final   Enterobacter cloacae complex NOT DETECTED NOT DETECTED Final   Escherichia coli NOT DETECTED NOT DETECTED Final   Klebsiella oxytoca NOT DETECTED NOT DETECTED Final   Klebsiella pneumoniae NOT DETECTED NOT DETECTED Final   Proteus species NOT DETECTED NOT DETECTED Final   Serratia marcescens NOT DETECTED NOT DETECTED Final   Carbapenem resistance NOT DETECTED NOT DETECTED Final   Haemophilus influenzae NOT DETECTED NOT DETECTED Final   Neisseria meningitidis NOT DETECTED NOT DETECTED Final    Pseudomonas aeruginosa NOT DETECTED NOT DETECTED Final   Candida albicans NOT DETECTED NOT DETECTED Final   Candida glabrata NOT DETECTED NOT DETECTED Final   Candida krusei NOT DETECTED NOT DETECTED Final   Candida parapsilosis NOT DETECTED NOT DETECTED Final   Candida tropicalis NOT DETECTED NOT DETECTED Final    Comment: Performed at  [facility], [address] [place], [place] [address]  Urine culture     Status: Abnormal   Collection Time: 01/21/17 12:15 PM  Result Value Ref Range Status   Specimen Description URINE, CLEAN CATCH  Final   Special Requests NONE  Final   Culture 70,000 COLONIES/mL STAPHYLOCOCCUS AUREUS (A)  Final   Report Status 01/23/2017 FINAL  Final   Organism ID, Bacteria STAPHYLOCOCCUS AUREUS (A)  Final      Susceptibility   Staphylococcus aureus - MIC*    CIPROFLOXACIN <=0.5 SENSITIVE Sensitive     GENTAMICIN <=0.5 SENSITIVE Sensitive     NITROFURANTOIN 32 SENSITIVE Sensitive     OXACILLIN <=0.25 SENSITIVE Sensitive     TETRACYCLINE <=1 SENSITIVE Sensitive     VANCOMYCIN <=0.5 SENSITIVE Sensitive     TRIMETH/SULFA <=10 SENSITIVE Sensitive     CLINDAMYCIN <=0.25 SENSITIVE Sensitive     RIFAMPIN <=0.5 SENSITIVE Sensitive     Inducible Clindamycin NEGATIVE Sensitive     * 70,000 COLONIES/mL STAPHYLOCOCCUS AUREUS  Surgical pcr screen     Status: Abnormal   Collection Time: 01/24/17    1:35 AM  Result Value Ref Range Status   MRSA, PCR NEGATIVE NEGATIVE Final   Staphylococcus aureus POSITIVE (A) NEGATIVE Final    Comment: (NOTE) The Xpert SA Assay (FDA approved for NASAL specimens in patients 22 years of age and older), is one component of a comprehensive surveillance program. It is not intended to diagnose infection nor to guide or monitor treatment.          Radiology Studies: No results found.    Scheduled Meds: . [MAR Hold] carvedilol  3.125 mg Oral BID  . gentamicin irrigation  80 mg Irrigation To Cath  . [MAR Hold] polyethylene  glycol  17 g Oral Daily  . [MAR Hold] senna-docusate  1 tablet Oral BID   Continuous Infusions: . sodium chloride    . sodium chloride    . sodium chloride    . [MAR Hold]  ceFAZolin (ANCEF) IV Stopped (01/24/17 0835)  .  ceFAZolin (ANCEF) IV       LOS: 3 days    Time spent in minutes: 35    Laine Giovanetti, MD [facility] Pager: [email or web address] Password [email or web address] 01/24/2017, 1:49 PM  

## 2017-01-25 NOTE — CV Procedure (Signed)
   TEE  Pacer lead erosion, exclude endocarditis  Time out performed  During this procedure the patient is administered Versed and Fentanyl (see report for details) to achieve and maintain moderate conscious sedation.  The patient's heart rate, blood pressure, and oxygen saturation are monitored continuously during the procedure. The period of conscious sedation is 40 minutes, of which I was present face-to-face 100% of this time.  Difficult intubation.   Findings:   - small mobile echodensity on one of 3 pacer leads at the junction of SVC/RA that may represent small mobile thrombus or vegetation. No other obvious lesions noted on pacer wires.    - EF 35-40%  - Mild MR  - Mild TR, myomatous/thickened tricuspid valve. No obvious vegetations.   - No obvious valvular vegetations  Discussed with Dr. Elizabeth Palau, MD

## 2017-01-25 NOTE — Progress Notes (Addendum)
PROGRESS NOTE    Chisum Habenicht   XBM:[id number]  DOB: Jan 22, 1941  DOA: 01/21/2017 PCP: Kirby Funk, MD   Brief Narrative:  [name] 76 y.o. male with PPM, history of CAD with CABG, HTN, sCHF, A-fib on Coumadin, complete heart block with pacemaker, CKD 3, tobacco abuse presents from home with confusion and weakness. Apparently also started Flexeril recently. Prior to coming in to the hospital, per wife, he was very active and volunteers with the church etc.  Found to have BP 86/46, sodium 127, exposed pacer wires and and pus coming out of pacemaker pocket. Blood cultures 2/2 for MSSA.   Subjective: Sleepy after TEE. Wife at bedside has been updated on the results.     Assessment & Plan:   Principal Problem:   Sepsis, MSSA bacteremia and in UA (likely due to hematogenic spread) -  pacemaker infection with noted ulcer/ pus at site -  fevers / encephalopathic - repeat blood cultures still + likely he still has a pacer   - pacer is infected, leads exposed- wife uncertain how long the patient has had an ulcer on chest chest wall-  to be removed today- could not be done yesterday as wife could not be contacted for consent - TTE  Today > no endocarditis but has a mobile density on a pacer lead - cont ancef  - appreciate ID follow up  Active Problems:   Acute metabolic encephalopathy - likely due to infection- follow    Paroxysmal atrial fibrillation (HCC) - resumed low dose of Coreg for now to prevent RVR - Heparin for now due to procedures- resume Coumadin per cardiology    Hypotension with h/o Hypertension - Coreg- lower than home dose for now- holding Lisinopril    Hyponatremia, Acute renal failure superimposed on stage 3 chronic kidney disease (HCC) - baseline Cr 1.1 - admitted with Cr 3.4- likely prerenal and ATN in setting of ACE I and sepsis - steadily improving - d/c IVF 9/23- appeared to be eating/drinking well and had good urine output- cont to follow - Cr  improving- recheck tomorrow  Metabolic acidosis - improving- likely from AKI and sepsis    Chronic systolic CHF (congestive heart failure) (HCC) - ECHO this admission: EF 35-40% with focal hypokinesis, grade 1 dCHF - not on chronic diuretics - follow fluid status carefully - slow IVF for now until alert enough to eat and drink    Hypotension/ hyponatremia - resolved with fluid replacement  Mildly elevated troponin - likely due to sepsis/ hypotension    Tobacco abuse - counseling - states he quit smoking about 2 wks prior to admission- d/c patch  DVT prophylaxis: Heparin Code Status: Full code Family Communication:  Disposition Plan: to be determined Consultants:   Cardiology  ID Procedures:  2 D ECHO Study Conclusions  - Left ventricle: The cavity size was mildly dilated. Systolic   function was moderately reduced. The estimated ejection fraction   was in the range of 35% to 40%. Diffuse hypokinesis. Dyskinesis   of the basal inferolateral myocardium. Akinesis of the basal   inferior myocardium. Doppler parameters are consistent with   abnormal left ventricular relaxation (grade 1 diastolic   dysfunction). - Aortic valve: Transvalvular velocity was within the normal range.   There was no stenosis. There was no regurgitation. - Mitral valve: Transvalvular velocity was within the normal range.   There was no evidence for stenosis. There was trivial   regurgitation. - Right ventricle: The cavity size was normal. Wall  thickness was   normal. Systolic function was mildly reduced. - Tricuspid valve: There was mild regurgitation. - Pulmonary arteries: Systolic pressure was within the normal   range. PA peak pressure: 27 mm Hg (S).  TEE small mobile echodensity on one of 3 pacer leads at the junction of SVC/RA that may represent small mobile thrombus or vegetation. No other obvious lesions noted on pacer wires.    - EF 35-40%  - Mild MR  - Mild TR, myomatous/thickened  tricuspid valve. No obvious vegetations.   - No obvious valvular vegetations  Antimicrobials:  Anti-infectives    Start     Dose/Rate Route Frequency Ordered Stop   01/24/17 1030  gentamicin (GARAMYCIN) 80 mg in sodium chloride irrigation 0.9 % 500 mL irrigation  Status:  Discontinued     80 mg Irrigation To Cath Lab 01/24/17 0944 01/24/17 1451   01/24/17 0945  ceFAZolin (ANCEF) IVPB 2g/100 mL premix  Status:  Discontinued     2 g 200 mL/hr over 30 Minutes Intravenous On call 01/24/17 0944 01/24/17 1451   01/23/17 1600  ceFAZolin (ANCEF) IVPB 2g/100 mL premix     2 g 200 mL/hr over 30 Minutes Intravenous Every 8 hours 01/23/17 1331     01/22/17 1100  ceFAZolin (ANCEF) IVPB 2g/100 mL premix  Status:  Discontinued     2 g 200 mL/hr over 30 Minutes Intravenous Every 12 hours 01/22/17 1046 01/23/17 1331   01/22/17 0500  vancomycin (VANCOCIN) IVPB 750 mg/150 ml premix  Status:  Discontinued     750 mg 150 mL/hr over 60 Minutes Intravenous Every 24 hours 01/21/17 1107 01/22/17 1030   01/22/17 0000  piperacillin-tazobactam (ZOSYN) IVPB 2.25 g  Status:  Discontinued     2.25 g 100 mL/hr over 30 Minutes Intravenous Every 8 hours 01/21/17 2305 01/22/17 1030   01/21/17 1115  vancomycin (VANCOCIN) IVPB 1000 mg/200 mL premix     1,000 mg 200 mL/hr over 60 Minutes Intravenous  Once 01/21/17 1104 01/21/17 1257   01/21/17 1100  piperacillin-tazobactam (ZOSYN) IVPB 3.375 g     3.375 g 100 mL/hr over 30 Minutes Intravenous  Once 01/21/17 1051 01/21/17 1150       Objective: Vitals:   01/24/17 1820 01/24/17 2144 01/25/17 0414 01/25/17 0806  BP: 114/70 117/66 113/78 112/70  Pulse: 66 77 75 74  Resp: Temp: 98 F (36.7 C) 98.7 F (37.1 C) 98.1 F (36.7 C) 98.1 F (36.7 C)  TempSrc: Oral Oral Oral Oral  SpO2: 99% 100% 100% 100%  Weight:  68.2 kg (150 lb 5.7 oz)    Height:        Intake/Output Summary (Last 24 hours) at 01/25/17 0813 Last data filed at 01/25/17 0602  Gross  per 24 hour  Intake              360 ml  Output              850 ml  Net             -490 ml   Filed Weights   01/23/17 2017 01/24/17 0500 01/24/17 2144  Weight: 68 kg (149 lb 14.6 oz) 68 kg (149 lb 14.6 oz) 68.2 kg (150 lb 5.7 oz)    Examination: General exam: Appears comfortable - somnolent at this time HEENT: PERRLA, oral mucosa moist, no sclera icterus or thrush Respiratory system: Clear to auscultation. Respiratory effort normal. Cardiovascular system: S1 & S2 heard, RRR.  No  murmurs  Gastrointestinal system: Abdomen soft, non-tender, nondistended. Normal bowel sound. No organomegaly Central nervous system:   No focal neurological deficits. Extremities: No cyanosis, clubbing or edema Skin:   ulcer in skin above pacemaker Psychiatry:  Difficult to assess as sleepy    Data Reviewed: I have personally reviewed following labs and imaging studies  CBC:  Recent Labs Lab 01/21/17 1030 01/22/17 0509 01/23/17 0324 01/24/17 0605 01/25/17 0213  WBC 11.4* 11.6* 11.7* 15.1* 15.0*  NEUTROABS 9.8*  --   --   --   --   HGB 14.3 13.8 13.6 13.3 12.9*  HCT 41.0 39.2 39.7 37.9* 36.5*  MCV 83.5 82.7 83.4 81.0 81.5  PLT 193 161 183 160 209   Basic Metabolic Panel:  Recent Labs Lab 01/21/17 1030 01/21/17 2329 01/22/17 0509 01/23/17 0324 01/24/17 0605  NA 127* 131* 132* 136 133*  K 4.4 4.1 3.7 3.8 4.1  CL 97* 103 107 107 109  CO2 18* 15* 14* 21* 16*  GLUCOSE 192* 142* 135* 142* 149*  BUN 84* 75* 77* 68* 61*  CREATININE 3.40* 2.65* 2.60* 2.18* 1.88*  CALCIUM 9.1 9.0 8.7* 8.8* 8.4*   GFR: Estimated Creatinine Clearance: 32.7 mL/min (A) (by C-G formula based on SCr of 1.88 mg/dL (H)). Liver Function Tests:  Recent Labs Lab 01/21/17 1030 01/24/17 0605  AST 173* 78*  ALT 83* 29  ALKPHOS 54 47  BILITOT 1.5* 0.9  PROT 8.1 5.8*  ALBUMIN 3.3* 2.0*   No results for input(s): LIPASE, AMYLASE in the last 168 hours. No results for input(s): AMMONIA in the last 168  hours. Coagulation Profile:  Recent Labs Lab 01/21/17 1030 01/22/17 0509 01/23/17 0324 01/24/17 0605  INR 1.80 1.92 2.01 2.02   Cardiac Enzymes:  Recent Labs Lab 01/21/17 1030 01/21/17 1530 01/21/17 2329 01/22/17 0509 01/22/17 0925  TROPONINI 0.18* 0.18* 0.17* 0.15* 0.13*   BNP (last 3 results) No results for input(s): PROBNP in the last 8760 hours. HbA1C: No results for input(s): HGBA1C in the last 72 hours. CBG:  Recent Labs Lab 01/21/17 2127 01/22/17 0834 01/23/17 0738 01/24/17 0737  GLUCAP 143* 161* 126* 156*   Lipid Profile: No results for input(s): CHOL, HDL, LDLCALC, TRIG, CHOLHDL, LDLDIRECT in the last 72 hours. Thyroid Function Tests: No results for input(s): TSH, T4TOTAL, FREET4, T3FREE, THYROIDAB in the last 72 hours. Anemia Panel: No results for input(s): VITAMINB12, FOLATE, FERRITIN, TIBC, IRON, RETICCTPCT in the last 72 hours. Urine analysis:    Component Value Date/Time   COLORURINE YELLOW 01/21/2017 1215   APPEARANCEUR CLOUDY (A) 01/21/2017 1215   LABSPEC 1.025 01/21/2017 1215   PHURINE 5.5 01/21/2017 1215   GLUCOSEU NEGATIVE 01/21/2017 1215   HGBUR LARGE (A) 01/21/2017 1215   BILIRUBINUR NEGATIVE 01/21/2017 1215   KETONESUR NEGATIVE 01/21/2017 1215   PROTEINUR >300 (A) 01/21/2017 1215   NITRITE NEGATIVE 01/21/2017 1215   LEUKOCYTESUR NEGATIVE 01/21/2017 1215   Sepsis Labs: (procalcitonin:4,lacticidven:4) ) Recent Results (from the past 240 hour(s))  Culture, blood (routine x 2)     Status: Abnormal   Collection Time: 01/21/17 10:50 AM  Result Value Ref Range Status   Specimen Description BLOOD LEFT FOREARM  Final   Special Requests   Final    BOTTLES DRAWN AEROBIC AND ANAEROBIC Blood Culture adequate volume   Culture  Setup Time   Final    GRAM POSITIVE COCCI IN CLUSTERS IN BOTH AEROBIC AND ANAEROBIC BOTTLES CRITICAL VALUE NOTED.  VALUE IS CONSISTENT WITH PREVIOUSLY REPORTED AND CALLED VALUE.  Culture (A)  Final     STAPHYLOCOCCUS AUREUS SUSCEPTIBILITIES PERFORMED ON PREVIOUS CULTURE WITHIN THE LAST 5 DAYS. Performed at [facility], [address] [place], [place] [address]    Report Status 01/24/2017 FINAL  Final  Culture, blood (routine x 2)     Status: Abnormal   Collection Time: 01/21/17 11:08 AM  Result Value Ref Range Status   Specimen Description BLOOD LEFT WRIST  Final   Special Requests   Final    BOTTLES DRAWN AEROBIC AND ANAEROBIC Blood Culture adequate volume   Culture  Setup Time   Final    GRAM POSITIVE COCCI IN CLUSTERS IN BOTH AEROBIC AND ANAEROBIC BOTTLES CRITICAL RESULT CALLED TO, READ BACK BY AND VERIFIED WITH: K.COOK PHARMD 01/22/17 0408 L.CHAMPION Performed at [facility], [address], [place], [place] [address]    Culture STAPHYLOCOCCUS AUREUS (A)  Final   Report Status 01/24/2017 FINAL  Final   Organism ID, Bacteria STAPHYLOCOCCUS AUREUS  Final      Susceptibility   Staphylococcus aureus - MIC*    CIPROFLOXACIN <=0.5 SENSITIVE Sensitive     ERYTHROMYCIN <=0.25 SENSITIVE Sensitive     GENTAMICIN <=0.5 SENSITIVE Sensitive     OXACILLIN 0.5 SENSITIVE Sensitive     TETRACYCLINE <=1 SENSITIVE Sensitive     VANCOMYCIN <=0.5 SENSITIVE Sensitive     TRIMETH/SULFA <=10 SENSITIVE Sensitive     CLINDAMYCIN <=0.25 SENSITIVE Sensitive     RIFAMPIN <=0.5 SENSITIVE Sensitive     Inducible Clindamycin NEGATIVE Sensitive     * STAPHYLOCOCCUS AUREUS  Blood Culture ID Panel (Reflexed)     Status: Abnormal   Collection Time: 01/21/17 11:08 AM  Result Value Ref Range Status   Enterococcus species NOT DETECTED NOT DETECTED Final   Vancomycin resistance NOT DETECTED NOT DETECTED Final   Listeria monocytogenes NOT DETECTED NOT DETECTED Final   Staphylococcus species DETECTED (A) NOT DETECTED Final    Comment: CRITICAL RESULT CALLED TO, READ BACK BY AND VERIFIED WITH: K.COOK PHARMD 01/22/17 0408 L.CHAMPION    Staphylococcus aureus DETECTED (A) NOT DETECTED Final     Comment: CRITICAL RESULT CALLED TO, READ BACK BY AND VERIFIED WITH: K.COOK PHARMD 01/22/17 0408 L.CHAMPION    Methicillin resistance NOT DETECTED NOT DETECTED Final   Streptococcus species NOT DETECTED NOT DETECTED Final   Streptococcus agalactiae NOT DETECTED NOT DETECTED Final   Streptococcus pneumoniae NOT DETECTED NOT DETECTED Final   Streptococcus pyogenes NOT DETECTED NOT DETECTED Final   Acinetobacter baumannii NOT DETECTED NOT DETECTED Final   Enterobacteriaceae species NOT DETECTED NOT DETECTED Final   Enterobacter cloacae complex NOT DETECTED NOT DETECTED Final   Escherichia coli NOT DETECTED NOT DETECTED Final   Klebsiella oxytoca NOT DETECTED NOT DETECTED Final   Klebsiella pneumoniae NOT DETECTED NOT DETECTED Final   Proteus species NOT DETECTED NOT DETECTED Final   Serratia marcescens NOT DETECTED NOT DETECTED Final   Carbapenem resistance NOT DETECTED NOT DETECTED Final   Haemophilus influenzae NOT DETECTED NOT DETECTED Final   Neisseria meningitidis NOT DETECTED NOT DETECTED Final   Pseudomonas aeruginosa NOT DETECTED NOT DETECTED Final   Candida albicans NOT DETECTED NOT DETECTED Final   Candida glabrata NOT DETECTED NOT DETECTED Final   Candida krusei NOT DETECTED NOT DETECTED Final   Candida parapsilosis NOT DETECTED NOT DETECTED Final   Candida tropicalis NOT DETECTED NOT DETECTED Final    Comment: Performed at [facility], [address] [place], [place] [address]  Urine culture  Status: Abnormal   Collection Time: 01/21/17 12:15 PM  Result Value Ref Range Status   Specimen Description URINE, CLEAN CATCH  Final   Special Requests NONE  Final   Culture 70,000 COLONIES/mL STAPHYLOCOCCUS AUREUS (A)  Final   Report Status 01/23/2017 FINAL  Final   Organism ID, Bacteria STAPHYLOCOCCUS AUREUS (A)  Final      Susceptibility   Staphylococcus aureus - MIC*    CIPROFLOXACIN <=0.5 SENSITIVE Sensitive     GENTAMICIN <=0.5 SENSITIVE Sensitive     NITROFURANTOIN  32 SENSITIVE Sensitive     OXACILLIN <=0.25 SENSITIVE Sensitive     TETRACYCLINE <=1 SENSITIVE Sensitive     VANCOMYCIN <=0.5 SENSITIVE Sensitive     TRIMETH/SULFA <=10 SENSITIVE Sensitive     CLINDAMYCIN <=0.25 SENSITIVE Sensitive     RIFAMPIN <=0.5 SENSITIVE Sensitive     Inducible Clindamycin NEGATIVE Sensitive     * 70,000 COLONIES/mL STAPHYLOCOCCUS AUREUS  Surgical pcr screen     Status: Abnormal   Collection Time: 01/24/17  1:35 AM  Result Value Ref Range Status   MRSA, PCR NEGATIVE NEGATIVE Final   Staphylococcus aureus POSITIVE (A) NEGATIVE Final    Comment: (NOTE) The Xpert SA Assay (FDA approved for NASAL specimens in patients 68 years of age and older), is one component of a comprehensive surveillance program. It is not intended to diagnose infection nor to guide or monitor treatment.   Culture, blood (Routine X 2) w Reflex to ID Panel     Status: None (Preliminary result)   Collection Time: 01/24/17  9:45 AM  Result Value Ref Range Status   Specimen Description BLOOD RIGHT HAND  Final   Special Requests IN PEDIATRIC BOTTLE Blood Culture adequate volume  Final   Culture  Setup Time   Final    GRAM POSITIVE COCCI IN CLUSTERS IN PEDIATRIC BOTTLE Organism ID to follow CRITICAL RESULT CALLED TO, READ BACK BY AND VERIFIED WITH: L. BAJBUS PHARMD, AT 0748 01/25/17 BY D. VANHOOK    Culture GRAM POSITIVE COCCI IN CLUSTERS  Final   Report Status PENDING  Incomplete  Blood Culture ID Panel (Reflexed)     Status: Abnormal   Collection Time: 01/24/17  9:45 AM  Result Value Ref Range Status   Enterococcus species NOT DETECTED NOT DETECTED Final   Listeria monocytogenes NOT DETECTED NOT DETECTED Final   Staphylococcus species DETECTED (A) NOT DETECTED Final    Comment: CRITICAL RESULT CALLED TO, READ BACK BY AND VERIFIED WITH: L. BAJBUS PHARMD, AT 1610 01/25/17 BY D. VANHOOK    Staphylococcus aureus DETECTED (A) NOT DETECTED Final    Comment: Methicillin (oxacillin) susceptible  Staphylococcus aureus (MSSA). Preferred therapy is anti staphylococcal beta lactam antibiotic (Cefazolin or Nafcillin), unless clinically contraindicated. CRITICAL RESULT CALLED TO, READ BACK BY AND VERIFIED WITH: L. BAJBUS PHARMD, AT 9604 01/25/17 BY D. VANHOOK    Methicillin resistance NOT DETECTED NOT DETECTED Final   Streptococcus species NOT DETECTED NOT DETECTED Final   Streptococcus agalactiae NOT DETECTED NOT DETECTED Final   Streptococcus pneumoniae NOT DETECTED NOT DETECTED Final   Streptococcus pyogenes NOT DETECTED NOT DETECTED Final   Acinetobacter baumannii NOT DETECTED NOT DETECTED Final   Enterobacteriaceae species NOT DETECTED NOT DETECTED Final   Enterobacter cloacae complex NOT DETECTED NOT DETECTED Final   Escherichia coli NOT DETECTED NOT DETECTED Final   Klebsiella oxytoca NOT DETECTED NOT DETECTED Final   Klebsiella pneumoniae NOT DETECTED NOT DETECTED Final   Proteus species NOT DETECTED NOT DETECTED Final  Serratia marcescens NOT DETECTED NOT DETECTED Final   Haemophilus influenzae NOT DETECTED NOT DETECTED Final   Neisseria meningitidis NOT DETECTED NOT DETECTED Final   Pseudomonas aeruginosa NOT DETECTED NOT DETECTED Final   Candida albicans NOT DETECTED NOT DETECTED Final   Candida glabrata NOT DETECTED NOT DETECTED Final   Candida krusei NOT DETECTED NOT DETECTED Final   Candida parapsilosis NOT DETECTED NOT DETECTED Final   Candida tropicalis NOT DETECTED NOT DETECTED Final         Radiology Studies: No results found.    Scheduled Meds: . carvedilol  3.125 mg Oral BID  . polyethylene glycol  17 g Oral Daily  . senna-docusate  1 tablet Oral BID   Continuous Infusions: .  ceFAZolin (ANCEF) IV Stopped (01/25/17 0658)     LOS: 4 days    Time spent in minutes: 35    Calvert Cantor, MD [facility] Pager: [email or web address] Password [facility] 01/25/2017, 8:13 AM

## 2017-01-25 NOTE — Progress Notes (Signed)
Wife at bedside.

## 2017-01-26 ENCOUNTER — Inpatient Hospital Stay (HOSPITAL_COMMUNITY): Payer: Medicare PPO

## 2017-01-26 ENCOUNTER — Encounter (HOSPITAL_COMMUNITY): Payer: Self-pay | Admitting: Internal Medicine

## 2017-01-26 LAB — CBC
HEMATOCRIT: 36.3 % — AB (ref 39.0–52.0)
Hemoglobin: 11.9 g/dL — ABNORMAL LOW (ref 13.0–17.0)
MCH: 27.6 pg (ref 26.0–34.0)
MCHC: 32.8 g/dL (ref 30.0–36.0)
MCV: 84.2 fL (ref 78.0–100.0)
PLATELETS: 340 10*3/uL (ref 150–400)
RBC: 4.31 MIL/uL (ref 4.22–5.81)
RDW: 16.2 % — AB (ref 11.5–15.5)
WBC: 14.1 10*3/uL — AB (ref 4.0–10.5)

## 2017-01-26 LAB — BASIC METABOLIC PANEL
ANION GAP: 10 (ref 5–15)
BUN: 53 mg/dL — ABNORMAL HIGH (ref 6–20)
CO2: 20 mmol/L — ABNORMAL LOW (ref 22–32)
CREATININE: 1.62 mg/dL — AB (ref 0.61–1.24)
Calcium: 8.6 mg/dL — ABNORMAL LOW (ref 8.9–10.3)
Chloride: 109 mmol/L (ref 101–111)
GFR calc Af Amer: 46 mL/min — ABNORMAL LOW (ref >60)
GFR, EST NON AFRICAN AMERICAN: 40 mL/min — AB (ref >60)
GLUCOSE: 135 mg/dL — AB (ref 65–99)
Potassium: 4.7 mmol/L (ref 3.5–5.1)
Sodium: 139 mmol/L (ref 135–145)

## 2017-01-26 LAB — PROTIME-INR
INR: 3.1
Prothrombin Time: 31.7 seconds — ABNORMAL HIGH (ref 11.4–15.2)

## 2017-01-26 LAB — GLUCOSE, CAPILLARY: Glucose-Capillary: 169 mg/dL — ABNORMAL HIGH (ref 65–99)

## 2017-01-26 MED ORDER — MUPIROCIN 2 % EX OINT
1.0000 "application " | TOPICAL_OINTMENT | Freq: Two times a day (BID) | CUTANEOUS | Status: AC
  Administered 2017-01-26 – 2017-01-30 (×10): 1 via NASAL
  Filled 2017-01-26 (×3): qty 22

## 2017-01-26 MED ORDER — IOPAMIDOL (ISOVUE-300) INJECTION 61%
INTRAVENOUS | Status: AC
  Administered 2017-01-26: 100 mL
  Filled 2017-01-26: qty 100

## 2017-01-26 NOTE — Progress Notes (Signed)
Pharmacist Heart Failure Core Measure Documentation  Assessment: Patrick Knox has an EF documented as 35-40% on 01/22/17 by Echo.  Rationale: Heart failure patients with left ventricular systolic dysfunction (LVSD) and an EF < 40% should be prescribed an angiotensin converting enzyme inhibitor (ACEI) or angiotensin receptor blocker (ARB) at discharge unless a contraindication is documented in the medical record.  This patient is not currently on an ACEI or ARB for HF.  This note is being placed in the record in order to provide documentation that a contraindication to the use of these agents is present for this encounter.  ACE Inhibitor or Angiotensin Receptor Blocker is contraindicated (specify all that apply)    ACEI allergy AND ARB allergy   Angioedema   Moderate or severe aortic stenosis   Hyperkalemia   Hypotension   Renal artery stenosis   Worsening renal function, preexisting renal disease or dysfunction  Harland German, Pharm D 01/26/2017 11:55 AM

## 2017-01-26 NOTE — Progress Notes (Signed)
INFECTIOUS DISEASE PROGRESS NOTE  ID: Patrick Knox is a 76 y.o. male with  Principal Problem:   Sepsis (HCC) Active Problems:   Hypertension   High cholesterol   Paroxysmal atrial fibrillation (HCC)   Acute renal failure superimposed on stage 3 chronic kidney disease (HCC)   Chronic systolic CHF (congestive heart failure) (HCC)   Hypotension   Acute metabolic encephalopathy   Hyponatremia   Elevated troponin   Tobacco abuse  Subjective: Resting quietly, awakens easily. No complaints.   Abtx:  Anti-infectives    Start     Dose/Rate Route Frequency Ordered Stop   01/25/17 1525  gentamicin (GARAMYCIN) 80 mg in sodium chloride irrigation 0.9 % 500 mL irrigation  Status:  Discontinued       As needed 01/25/17 1526 01/25/17 1614   01/25/17 1300  gentamicin (GARAMYCIN) 80 mg in sodium chloride irrigation 0.9 % 500 mL irrigation     80 mg Irrigation To Cath Lab 01/25/17 1240 01/25/17 1525   01/25/17 1300  ceFAZolin (ANCEF) IVPB 2g/100 mL premix     2 g 200 mL/hr over 30 Minutes Intravenous To Cath Lab 01/25/17 1240 01/25/17 1421   01/24/17 1030  gentamicin (GARAMYCIN) 80 mg in sodium chloride irrigation 0.9 % 500 mL irrigation  Status:  Discontinued     80 mg Irrigation To Cath Lab 01/24/17 0944 01/24/17 1451   01/24/17 0945  ceFAZolin (ANCEF) IVPB 2g/100 mL premix  Status:  Discontinued     2 g 200 mL/hr over 30 Minutes Intravenous On call 01/24/17 0944 01/24/17 1451   01/23/17 1600  ceFAZolin (ANCEF) IVPB 2g/100 mL premix     2 g 200 mL/hr over 30 Minutes Intravenous Every 8 hours 01/23/17 1331     01/22/17 1100  ceFAZolin (ANCEF) IVPB 2g/100 mL premix  Status:  Discontinued     2 g 200 mL/hr over 30 Minutes Intravenous Every 12 hours 01/22/17 1046 01/23/17 1331   01/22/17 0500  vancomycin (VANCOCIN) IVPB 750 mg/150 ml premix  Status:  Discontinued     750 mg 150 mL/hr over 60 Minutes Intravenous Every 24 hours 01/21/17 1107 01/22/17 1030   01/22/17 0000   piperacillin-tazobactam (ZOSYN) IVPB 2.25 g  Status:  Discontinued     2.25 g 100 mL/hr over 30 Minutes Intravenous Every 8 hours 01/21/17 2305 01/22/17 1030   01/21/17 1115  vancomycin (VANCOCIN) IVPB 1000 mg/200 mL premix     1,000 mg 200 mL/hr over 60 Minutes Intravenous  Once 01/21/17 1104 01/21/17 1257   01/21/17 1100  piperacillin-tazobactam (ZOSYN) IVPB 3.375 g     3.375 g 100 mL/hr over 30 Minutes Intravenous  Once 01/21/17 1051 01/21/17 1150      Medications:  Scheduled: . carvedilol  3.125 mg Oral BID  . mupirocin ointment  1 application Nasal BID  . senna-docusate  1 tablet Oral BID  . sodium chloride flush  3 mL Intravenous Q12H    Objective: Vital signs in last 24 hours: Temp:  [98 F (36.7 C)-98.8 F (37.1 C)] 98.3 F (36.8 C) (09/26 1112) Pulse Rate:  [52-154] 60 (09/26 1112) Resp:  [10-29] 18 (09/26 1112) BP: (100-145)/(68-90) 108/90 (09/26 1112) SpO2:  [97 %-100 %] 100 % (09/26 1112) Weight:  [67.1 kg (147 lb 14.4 oz)] 67.1 kg (147 lb 14.4 oz) (09/26 0422)   General appearance: alert, cooperative and no distress Resp: clear to auscultation bilaterally Chest wall: wounds dressed.  Cardio: regular rate and rhythm GI: normal findings: bowel sounds normal  and soft, non-tender  Lab Results  Recent Labs  01/24/17 0605 01/25/17 0213 01/26/17 0535  WBC 15.1* 15.0* 14.1*  HGB 13.3 12.9* 11.9*  HCT 37.9* 36.5* 36.3*  NA 133*  --  139  K 4.1  --  4.7  CL 109  --  109  CO2 16*  --  20*  BUN 61*  --  53*  CREATININE 1.88*  --  1.62*   Liver Panel  Recent Labs  01/24/17 0605  PROT 5.8*  ALBUMIN 2.0*  AST 78*  ALT 29  ALKPHOS 47  BILITOT 0.9   Sedimentation Rate No results for input(s): ESRSEDRATE in the last 72 hours. C-Reactive Protein No results for input(s): CRP in the last 72 hours.  Microbiology: Recent Results (from the past 240 hour(s))  Culture, blood (routine x 2)     Status: Abnormal   Collection Time: 01/21/17 10:50 AM  Result  Value Ref Range Status   Specimen Description BLOOD LEFT FOREARM  Final   Special Requests   Final    BOTTLES DRAWN AEROBIC AND ANAEROBIC Blood Culture adequate volume   Culture  Setup Time   Final    GRAM POSITIVE COCCI IN CLUSTERS IN BOTH AEROBIC AND ANAEROBIC BOTTLES CRITICAL VALUE NOTED.  VALUE IS CONSISTENT WITH PREVIOUSLY REPORTED AND CALLED VALUE.    Culture (A)  Final    STAPHYLOCOCCUS AUREUS SUSCEPTIBILITIES PERFORMED ON PREVIOUS CULTURE WITHIN THE LAST 5 DAYS. Performed at [facility], [address] [place], [place] [address]    Report Status 01/24/2017 FINAL  Final  Culture, blood (routine x 2)     Status: Abnormal   Collection Time: 01/21/17 11:08 AM  Result Value Ref Range Status   Specimen Description BLOOD LEFT WRIST  Final   Special Requests   Final    BOTTLES DRAWN AEROBIC AND ANAEROBIC Blood Culture adequate volume   Culture  Setup Time   Final    GRAM POSITIVE COCCI IN CLUSTERS IN BOTH AEROBIC AND ANAEROBIC BOTTLES CRITICAL RESULT CALLED TO, READ BACK BY AND VERIFIED WITH: K.COOK PHARMD 01/22/17 0408 L.CHAMPION Performed at [facility], [address] [place], [place] [address]    Culture STAPHYLOCOCCUS AUREUS (A)  Final   Report Status 01/24/2017 FINAL  Final   Organism ID, Bacteria STAPHYLOCOCCUS AUREUS  Final      Susceptibility   Staphylococcus aureus - MIC*    CIPROFLOXACIN <=0.5 SENSITIVE Sensitive     ERYTHROMYCIN <=0.25 SENSITIVE Sensitive     GENTAMICIN <=0.5 SENSITIVE Sensitive     OXACILLIN 0.5 SENSITIVE Sensitive     TETRACYCLINE <=1 SENSITIVE Sensitive     VANCOMYCIN <=0.5 SENSITIVE Sensitive     TRIMETH/SULFA <=10 SENSITIVE Sensitive     CLINDAMYCIN <=0.25 SENSITIVE Sensitive     RIFAMPIN <=0.5 SENSITIVE Sensitive     Inducible Clindamycin NEGATIVE Sensitive     * STAPHYLOCOCCUS AUREUS  Blood Culture ID Panel (Reflexed)     Status: Abnormal   Collection Time: 01/21/17 11:08 AM  Result Value Ref Range Status   Enterococcus  species NOT DETECTED NOT DETECTED Final   Vancomycin resistance NOT DETECTED NOT DETECTED Final   Listeria monocytogenes NOT DETECTED NOT DETECTED Final   Staphylococcus species DETECTED (A) NOT DETECTED Final    Comment: CRITICAL RESULT CALLED TO, READ BACK BY AND VERIFIED WITH: K.COOK PHARMD 01/22/17 0408 L.CHAMPION    Staphylococcus aureus DETECTED (A) NOT DETECTED Final    Comment: CRITICAL RESULT CALLED TO, READ BACK BY  AND VERIFIED WITH: K.COOK PHARMD 01/22/17 0408 L.CHAMPION    Methicillin resistance NOT DETECTED NOT DETECTED Final   Streptococcus species NOT DETECTED NOT DETECTED Final   Streptococcus agalactiae NOT DETECTED NOT DETECTED Final   Streptococcus pneumoniae NOT DETECTED NOT DETECTED Final   Streptococcus pyogenes NOT DETECTED NOT DETECTED Final   Acinetobacter baumannii NOT DETECTED NOT DETECTED Final   Enterobacteriaceae species NOT DETECTED NOT DETECTED Final   Enterobacter cloacae complex NOT DETECTED NOT DETECTED Final   Escherichia coli NOT DETECTED NOT DETECTED Final   Klebsiella oxytoca NOT DETECTED NOT DETECTED Final   Klebsiella pneumoniae NOT DETECTED NOT DETECTED Final   Proteus species NOT DETECTED NOT DETECTED Final   Serratia marcescens NOT DETECTED NOT DETECTED Final   Carbapenem resistance NOT DETECTED NOT DETECTED Final   Haemophilus influenzae NOT DETECTED NOT DETECTED Final   Neisseria meningitidis NOT DETECTED NOT DETECTED Final   Pseudomonas aeruginosa NOT DETECTED NOT DETECTED Final   Candida albicans NOT DETECTED NOT DETECTED Final   Candida glabrata NOT DETECTED NOT DETECTED Final   Candida krusei NOT DETECTED NOT DETECTED Final   Candida parapsilosis NOT DETECTED NOT DETECTED Final   Candida tropicalis NOT DETECTED NOT DETECTED Final    Comment: Performed at [facility], [address], [place], [place] [address]  Urine culture     Status: Abnormal   Collection Time: 01/21/17 12:15 PM  Result Value Ref Range Status   Specimen  Description URINE, CLEAN CATCH  Final   Special Requests NONE  Final   Culture 70,000 COLONIES/mL STAPHYLOCOCCUS AUREUS (A)  Final   Report Status 01/23/2017 FINAL  Final   Organism ID, Bacteria STAPHYLOCOCCUS AUREUS (A)  Final      Susceptibility   Staphylococcus aureus - MIC*    CIPROFLOXACIN <=0.5 SENSITIVE Sensitive     GENTAMICIN <=0.5 SENSITIVE Sensitive     NITROFURANTOIN 32 SENSITIVE Sensitive     OXACILLIN <=0.25 SENSITIVE Sensitive     TETRACYCLINE <=1 SENSITIVE Sensitive     VANCOMYCIN <=0.5 SENSITIVE Sensitive     TRIMETH/SULFA <=10 SENSITIVE Sensitive     CLINDAMYCIN <=0.25 SENSITIVE Sensitive     RIFAMPIN <=0.5 SENSITIVE Sensitive     Inducible Clindamycin NEGATIVE Sensitive     * 70,000 COLONIES/mL STAPHYLOCOCCUS AUREUS  Surgical pcr screen     Status: Abnormal   Collection Time: 01/24/17  1:35 AM  Result Value Ref Range Status   MRSA, PCR NEGATIVE NEGATIVE Final   Staphylococcus aureus POSITIVE (A) NEGATIVE Final    Comment: (NOTE) The Xpert SA Assay (FDA approved for NASAL specimens in patients 60 years of age and older), is one component of a comprehensive surveillance program. It is not intended to diagnose infection nor to guide or monitor treatment.   Culture, blood (Routine X 2) w Reflex to ID Panel     Status: None (Preliminary result)   Collection Time: 01/24/17  9:45 AM  Result Value Ref Range Status   Specimen Description BLOOD RIGHT HAND  Final   Special Requests IN PEDIATRIC BOTTLE Blood Culture adequate volume  Final   Culture  Setup Time   Final    GRAM POSITIVE COCCI IN CLUSTERS IN PEDIATRIC BOTTLE CRITICAL RESULT CALLED TO, READ BACK BY AND VERIFIED WITH: L. BAJBUS PHARMD, AT 6045 01/25/17 BY D. VANHOOK    Culture GRAM POSITIVE COCCI IN CLUSTERS  Final   Report Status PENDING  Incomplete  Blood Culture ID Panel (Reflexed)     Status: Abnormal  Collection Time: 01/24/17  9:45 AM  Result Value Ref Range Status   Enterococcus species NOT  DETECTED NOT DETECTED Final   Listeria monocytogenes NOT DETECTED NOT DETECTED Final   Staphylococcus species DETECTED (A) NOT DETECTED Final    Comment: CRITICAL RESULT CALLED TO, READ BACK BY AND VERIFIED WITH: L. BAJBUS PHARMD, AT 5621 01/25/17 BY D. VANHOOK    Staphylococcus aureus DETECTED (A) NOT DETECTED Final    Comment: Methicillin (oxacillin) susceptible Staphylococcus aureus (MSSA). Preferred therapy is anti staphylococcal beta lactam antibiotic (Cefazolin or Nafcillin), unless clinically contraindicated. CRITICAL RESULT CALLED TO, READ BACK BY AND VERIFIED WITH: L. BAJBUS PHARMD, AT 3086 01/25/17 BY D. VANHOOK    Methicillin resistance NOT DETECTED NOT DETECTED Final   Streptococcus species NOT DETECTED NOT DETECTED Final   Streptococcus agalactiae NOT DETECTED NOT DETECTED Final   Streptococcus pneumoniae NOT DETECTED NOT DETECTED Final   Streptococcus pyogenes NOT DETECTED NOT DETECTED Final   Acinetobacter baumannii NOT DETECTED NOT DETECTED Final   Enterobacteriaceae species NOT DETECTED NOT DETECTED Final   Enterobacter cloacae complex NOT DETECTED NOT DETECTED Final   Escherichia coli NOT DETECTED NOT DETECTED Final   Klebsiella oxytoca NOT DETECTED NOT DETECTED Final   Klebsiella pneumoniae NOT DETECTED NOT DETECTED Final   Proteus species NOT DETECTED NOT DETECTED Final   Serratia marcescens NOT DETECTED NOT DETECTED Final   Haemophilus influenzae NOT DETECTED NOT DETECTED Final   Neisseria meningitidis NOT DETECTED NOT DETECTED Final   Pseudomonas aeruginosa NOT DETECTED NOT DETECTED Final   Candida albicans NOT DETECTED NOT DETECTED Final   Candida glabrata NOT DETECTED NOT DETECTED Final   Candida krusei NOT DETECTED NOT DETECTED Final   Candida parapsilosis NOT DETECTED NOT DETECTED Final   Candida tropicalis NOT DETECTED NOT DETECTED Final    Studies/Results: Dg Chest 2 View  Result Date: 01/26/2017 CLINICAL DATA:  Status post permanent pacemaker change  out. EXAM: CHEST  2 VIEW COMPARISON:  Portable chest x-ray of January 21, 2017 FINDINGS: The previous pacemaker and electrodes have been removed. A single electrode has been placed with the tip in the right ventricular apex. The generator lies above the left clavicle. The lungs are adequately inflated. There is no postprocedure pneumothorax. There is stable scarring at the left lung base. The heart is top-normal in size. The pulmonary vascularity is normal. There is calcification in the wall of the aortic arch. The patient has undergone previous CABG. IMPRESSION: No postprocedure complication following permanent pacemaker change out. Previous CABG.  Thoracic aortic atherosclerosis. Electronically Signed   By: David  [place] M.D.   On: 01/26/2017 07:25     Assessment/Plan: Pacemaker endocarditis  Extracted 9-25 Back Pain MSSA bacteremia  Total days of antibiotics: 5 ancef  Would plan on 6 weeks of ancef His Cr is improving (normal in Dec) Would advise checking MRI of spine.    Hopefully his BCx will clear with removal of pacer Will repeat his BCx.   Would suggest re-implantation when BCx negative (there is no clear guideline on this)       Johny Sax MD, FACP Infectious Diseases (pager) [phone] [email or web address] 01/26/2017, 2:37 PM  LOS: 5 days

## 2017-01-26 NOTE — Care Management Important Message (Signed)
Important Message  Patient Details  Name: Patrick Knox MRN: [id number] Date of Birth: 1940/06/17   Medicare Important Message Given:  Yes    Kyla Balzarine 01/26/2017, 9:35 AM

## 2017-01-26 NOTE — Progress Notes (Signed)
D/w Dr. Johney Frame, patient has wic in extraction site, recommends starting tomorrow to remove approximately 12" daily until completely removed.  Plan for suture removal +/-7 days.  Re-implant timing as per ID recommendations, and deferred to Dr. Graciela Husbands, as well as anticoagulation timing, though to hold off as long as possible.  Francis Dowse, PA-C

## 2017-01-26 NOTE — Progress Notes (Signed)
Pharmacy Antibiotic Note Patrick Knox is a 76 y.o. male with MSSA bacteremia and PM pocket infection. She is now s/p TEE and plans pacer removal.  Pharmacy following for cefazolin dosing. ID also following.  -SCr= 1.62, CrCl ~ 40  Plan: -Continue Cefazolin 2 grams IV every 8 hours  -Will follow renal function and clinical progress   Height:  (185.4 cm) Weight: 147 lb 14.4 oz (67.1 kg) IBW/kg (Calculated) : 79.9  Temp (24hrs), Avg:98.4 F (36.9 C), Min:98 F (36.7 C), Max:98.8 F (37.1 C)   Recent Labs Lab 01/21/17 1044 01/21/17 1405 01/21/17 2329 01/22/17 0509 01/23/17 0324 01/24/17 0605 01/25/17 0213 01/26/17 0535  WBC  --   --   --  11.6* 11.7* 15.1* 15.0* 14.1*  CREATININE  --   --  2.65* 2.60* 2.18* 1.88*  --  1.62*  LATICACIDVEN 2.53* 1.79 1.6  --   --   --   --   --     Estimated Creatinine Clearance: 37.4 mL/min (A) (by C-G formula based on SCr of 1.62 mg/dL (H)).    No Known Allergies  Antimicrobials this admission: 9/21: BCx: MSSA per BCID in 4/4 9/21 UCx: MSSA   Microbiology results: 9/22 Cefazolin  >>  9/21 Zosyn 9/22 9/21 vancomycin 9/22  Thank you for allowing pharmacy to be a part of this patient's care.  Harland German, Pharm D 01/26/2017 10:46 AM

## 2017-01-26 NOTE — Progress Notes (Signed)
Progress Note  Patient Name: Patrick Knox Date of Encounter: 01/26/2017  Primary Cardiologist: Jens Som  Subjective   S/p removal yesterday with temp perm placed  Tired still complaining of back pain   Inpatient Medications    Scheduled Meds: . carvedilol  3.125 mg Oral BID  . senna-docusate  1 tablet Oral BID  . sodium chloride flush  3 mL Intravenous Q12H   Continuous Infusions: . sodium chloride    .  ceFAZolin (ANCEF) IV 2 g (01/26/17 0604)   PRN Meds: sodium chloride, [DISCONTINUED] acetaminophen **OR** acetaminophen, acetaminophen, HYDROcodone-acetaminophen, ondansetron (ZOFRAN) IV, sodium chloride flush   Vital Signs    Vitals:   01/25/17 2015 01/26/17 0013 01/26/17 0422 01/26/17 0748  BP: 113/71 100/71 101/75 104/68  Pulse: (!) 59 (!) 59 (!) 59 (!) 59  Resp: (!) 25 (!) 23 18   Temp: 98.5 F (36.9 C) 98.8 F (37.1 C) 98 F (36.7 C) 98.3 F (36.8 C)  TempSrc: Oral Oral Oral Oral  SpO2: 98% 99% 100% 100%  Weight:   147 lb 14.4 oz (67.1 kg)   Height:        Intake/Output Summary (Last 24 hours) at 01/26/17 1610 Last data filed at 01/26/17 0604  Gross per 24 hour  Intake              320 ml  Output             1450 ml  Net            -1130 ml   Filed Weights   01/24/17 2144 01/25/17 0855 01/26/17 0422  Weight: 150 lb 5.7 oz (68.2 kg) 150 lb (68 kg) 147 lb 14.4 oz (67.1 kg)    Telemetry    Telemetry Personally reviewed  V paced  ECG      Physical Exam  Well developed and nourished in no acute distress HENT normal Neck supple Clear Regular rate and rhythm, 2/6 m Abd-soft with active BS without hepatomegaly No Clubbing cyanosis edema Skin-warm and dry A & Oriented  Grossly normal sensory and motor function    Labs    Chemistry  Recent Labs Lab 01/21/17 1030  01/23/17 0324 01/24/17 0605 01/26/17 0535  NA 127*  < > 136 133* 139  K 4.4  < > 3.8 4.1 4.7  CL 97*  < > 107 109 109  CO2 18*  < > 21* 16* 20*  GLUCOSE 192*  < > 142*  149* 135*  BUN 84*  < > 68* 61* 53*  CREATININE 3.40*  < > 2.18* 1.88* 1.62*  CALCIUM 9.1  < > 8.8* 8.4* 8.6*  PROT 8.1  --   --  5.8*  --   ALBUMIN 3.3*  --   --  2.0*  --   AST 173*  --   --  78*  --   ALT 83*  --   --  29  --   ALKPHOS 54  --   --  47  --   BILITOT 1.5*  --   --  0.9  --   GFRNONAA 16*  < > 28* 33* 40*  GFRAA 19*  < > 32* 39* 46*  ANIONGAP 12  < > < > = values in this interval not displayed.   Hematology  Recent Labs Lab 01/24/17 0605 01/25/17 0213 01/26/17 0535  WBC 15.1* 15.0* 14.1*  RBC 4.68 4.48 4.31  HGB 13.3 12.9* 11.9*  HCT 37.9* 36.5*  36.3*  MCV 81.0 81.5 84.2  MCH 28.4 28.8 27.6  MCHC 35.1 35.3 32.8  RDW 15.5 16.1* 16.2*  PLT 160 209 340    Cardiac Enzymes  Recent Labs Lab 01/21/17 1530 01/21/17 2329 01/22/17 0509 01/22/17 0925  TROPONINI 0.18* 0.17* 0.15* 0.13*   No results for input(s): TROPIPOC in the last 168 hours.   BNP  Recent Labs Lab 01/21/17 2329  BNP 764.7*     DDimer No results for input(s): DDIMER in the last 168 hours.   Radiology    Dg Chest 2 View  Result Date: 01/26/2017 CLINICAL DATA:  Status post permanent pacemaker change out. EXAM: CHEST  2 VIEW COMPARISON:  Portable chest x-ray of January 21, 2017 FINDINGS: The previous pacemaker and electrodes have been removed. A single electrode has been placed with the tip in the right ventricular apex. The generator lies above the left clavicle. The lungs are adequately inflated. There is no postprocedure pneumothorax. There is stable scarring at the left lung base. The heart is top-normal in size. The pulmonary vascularity is normal. There is calcification in the wall of the aortic arch. The patient has undergone previous CABG. IMPRESSION: No postprocedure complication following permanent pacemaker change out. Previous CABG.  Thoracic aortic atherosclerosis. Electronically Signed   By: David  [place] M.D.   On: 01/26/2017 07:25    Cardiac Studies   Blood  cultures now growing out Staph  Patient Profile     76 y.o. male admitted with altered mental status, growing out staph aureus now, with drainage from PM pocket. He was hypotensive and this has resolved. His renal function has improved.  Assessment & Plan    1. MSSA with PM pocket infection      SJM BiVe pacer implanted 02/18/16     staph UTI as well    2. CHB     3. Chronic systolic heart failure    4. Paroxysmal Afib    5. CAD (remote CABG)    Now s/p exraction and temp perm Will wait to see if ID wants to image back with pain and duration of ABx  Would also get input as to remiplant date  Repeat [facility] 9/24 also postive    Signed, Sherryl Manges, MD  01/26/2017, 8:22 AM  Patient ID: Patrick Knox, male   DOB: [birth year], 76 y.o.   MRN: [id number]

## 2017-01-26 NOTE — Progress Notes (Signed)
Physical Therapy Treatment Patient Details Name: Patrick Knox MRN: [id number] DOB: 1941/04/19 Today's Date: 01/26/2017    History of Present Illness Pt is 76 yo male with known HTN, HL, sCHF, EF 35%, a-fib on Coumadin, complete heart block and s/p pacemaker, CAD, s/p CABG, tobacco use, CKD stage III, presented with lethargy and weakness, poor oral intake, progressively worsening confusion. 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  He is able to get there and was considerably more comfortable once there.  CNA helped with his effort and nursing was given an update on the assistance needed.  Considering his increased dependency, nursing stated he had some medical changes that might be contributing to the difficulties.  Will continue acutely and focus on his standing control and ability to take steps with confidence toward shortening his SNF stay.   Follow Up Recommendations  SNF     Equipment Recommendations  Rolling walker with 5" wheels;3in1 (PT)    Recommendations for Other Services       Precautions / Restrictions Precautions Precautions: Fall Precaution Comments: telemetry Restrictions Weight Bearing Restrictions:  (avoiding use of LUE)    Mobility  Bed Mobility Overal bed mobility: Needs Assistance Bed Mobility: Supine to Sit     Supine to sit: Max assist;+2 for physical assistance;+2 for safety/equipment;Mod assist     General bed mobility comments: very slow, requires help to get legs to side of bed with pt stopping many times in the process then with CNA pivoted to side of bed but took a great deal of time to assist trunk due to back pain&stiffness complaints.  Transfers Overall transfer level: Needs assistance Equipment used: 1 person hand held assist;2 person hand held assist (second person  for lines not to assist pt directly) Transfers: Sit to/from [employer] Sit to Stand: Mod assist;From elevated surface Stand pivot transfers: Max assist;+2 physical assistance;+2 safety/equipment       General transfer comment: cued to initiate the effort, then to sidestep to get to the chair which pt actually resists due to some fear of pain being elicited  Ambulation/Gait             General Gait Details: transfer to the chair with assistance by PT for directing with gait belt and avoiding use of LUE, then second person to manage telemetry and foley, very fearful but able to assist the effort   Stairs            Wheelchair Mobility    Modified Rankin (Stroke Patients Only)       Balance     Sitting balance-Leahy Scale: Fair     Standing balance support: Bilateral upper extremity supported Standing balance-Leahy Scale: Fair (pt is resistant to standing but then can assist)                              Cognition Arousal/Alertness: Awake/alert Behavior During Therapy: Flat affect Overall Cognitive Status: Impaired/Different from baseline Area of Impairment: Attention;Following commands;Safety/judgement;Awareness;Problem solving                 Orientation Level: Situation Current Attention Level: Selective   Following Commands: Follows one step commands with increased time;Follows one step commands inconsistently Safety/Judgement: Decreased awareness of  safety;Decreased awareness of deficits Awareness: Intellectual Problem Solving: Slow processing;Decreased initiation;Requires verbal cues General Comments: pt is requiring extra help to initiate and sequence, continually stops and closes his eyes      Exercises      General Comments        Pertinent Vitals/Pain Pain Assessment: Faces Pain Score: 6  Faces Pain Scale: Hurts even more Pain Location: stiffness in back with movement to bedside Pain Descriptors /  Indicators: Tightness Pain Intervention(s): Monitored during session;Limited activity within patient's tolerance;Premedicated before session;Repositioned    Home Living Family/patient expects to be discharged to:: Private residence Living Arrangements: Spouse/significant other                  Prior Function            PT Goals (current goals can now be found in the care plan section) Acute Rehab PT Goals Patient Stated Goal: to go home Progress towards PT goals: Progressing toward goals    Frequency    Min 2X/week      PT Plan Current plan remains appropriate    Co-evaluation              AM-PAC PT "6 Clicks" Daily Activity  Outcome Measure  Difficulty turning over in bed (including adjusting bedclothes, sheets and blankets)?: A Lot Difficulty moving from lying on back to sitting on the side of the bed? : Unable Difficulty sitting down on and standing up from a chair with arms (e.g., wheelchair, bedside commode, etc,.)?: Unable Help needed moving to and from a bed to chair (including a wheelchair)?: A Lot Help needed walking in hospital room?: Total Help needed climbing 3-5 steps with a railing? : Total 6 Click Score: 8    End of Session Equipment Utilized During Treatment: Gait belt Activity Tolerance: Patient limited by pain Patient left: in chair;with call bell/phone within reach;with nursing/sitter in room Nurse Communication: Mobility status;Other (comment) (pt is going to need assist back to bed but no alarm-none the) PT Visit Diagnosis: Unsteadiness on feet (R26.81);Other abnormalities of gait and mobility (R26.89);Muscle weakness (generalized) (M62.81)     Time: 0981-1914 PT Time Calculation (min) (ACUTE ONLY): 43 min  Charges:  $Therapeutic Activity: 23-37 mins $Neuromuscular Re-education: 8-22 mins                    G Codes:  Functional Assessment Tool Used: AM-PAC 6 Clicks Basic Mobility     Ivar Drape 01/26/2017, 12:43 PM   Samul Dada, PT MS Acute Rehab Dept. Number: [facility] [id number] and [facility] [phone]

## 2017-01-26 NOTE — Progress Notes (Signed)
PROGRESS NOTE    Patrick Knox   VFI:[id number]  DOB: 08-14-1940  DOA: 01/21/2017 PCP: Kirby Funk, MD   Brief Narrative:  Patrick Knox 76 y.o. male with PPM, history of CAD with CABG, HTN, sCHF, A-fib on Coumadin, complete heart block with pacemaker, CKD 3, tobacco abuse presents from home with confusion and weakness. Apparently also started Flexeril recently. Prior to coming in to the hospital, per wife, he was very active and volunteers with the church etc.  Found to have BP 86/46, sodium 127, exposed pacer wires and and pus coming out of pacemaker pocket. Blood cultures 2/2 for MSSA.   Subjective: Pt has no new complaints. No acute issues overnight.   Assessment & Plan:   Principal Problem:   Sepsis, MSSA bacteremia and in UA (likely due to hematogenic spread) -  pacemaker infection with noted ulcer/ pus at site -  fevers / encephalopathic - repeat blood cultures still + likely he still has a pacer   - pacer was infected and removed. Patient has temporary pacer - TTE  Today > no endocarditis but has a mobile density on a pacer lead - Will continue ancef  - appreciate ID follow up  Active Problems:    Acute metabolic encephalopathy - likely due to infection- follow    Paroxysmal atrial fibrillation (HCC) - continue coreg. - Heparin for now due to procedures- resume Coumadin per cardiology    Hypotension with h/o Hypertension - Coreg- lower than home dose for now- holding Lisinopril    Hyponatremia, Acute renal failure superimposed on stage 3 chronic kidney disease (HCC) - baseline Cr 1.1 - admitted with Cr 3.4- likely prerenal and ATN in setting of ACE I and sepsis - steadily improving - d/c IVF 9/23- appeared to be eating/drinking well and had good urine output- cont to follow - Cr improving- will reassess serum creatinine.  Metabolic acidosis - improving- likely from AKI and sepsis    Chronic systolic CHF (congestive heart failure) (HCC) - ECHO this  admission: EF 35-40% with focal hypokinesis, grade 1 dCHF - not on chronic diuretics    Hypotension/ hyponatremia - resolved with fluid replacement  Mildly elevated troponin - likely due to sepsis/ hypotension, no chest pain reported.    Tobacco abuse - counseling - states he quit smoking about 2 wks prior to admission- d/c patch  DVT prophylaxis: Heparin Code Status: Full code Family Communication:  Disposition Plan: to be determined Consultants:   Cardiology  ID Procedures:  2 D ECHO Study Conclusions  - Left ventricle: The cavity size was mildly dilated. Systolic   function was moderately reduced. The estimated ejection fraction   was in the range of 35% to 40%. Diffuse hypokinesis. Dyskinesis   of the basal inferolateral myocardium. Akinesis of the basal   inferior myocardium. Doppler parameters are consistent with   abnormal left ventricular relaxation (grade 1 diastolic   dysfunction). - Aortic valve: Transvalvular velocity was within the normal range.   There was no stenosis. There was no regurgitation. - Mitral valve: Transvalvular velocity was within the normal range.   There was no evidence for stenosis. There was trivial   regurgitation. - Right ventricle: The cavity size was normal. Wall thickness was   normal. Systolic function was mildly reduced. - Tricuspid valve: There was mild regurgitation. - Pulmonary arteries: Systolic pressure was within the normal   range. PA peak pressure: 27 mm Hg (S).  TEE small mobile echodensity on one of 3 pacer leads at the junction  of SVC/RA that may represent small mobile thrombus or vegetation. No other obvious lesions noted on pacer wires.    - EF 35-40%  - Mild MR  - Mild TR, myomatous/thickened tricuspid valve. No obvious vegetations.   - No obvious valvular vegetations  Antimicrobials:  Anti-infectives    Start     Dose/Rate Route Frequency Ordered Stop   01/25/17 1525  gentamicin (GARAMYCIN) 80 mg in sodium  chloride irrigation 0.9 % 500 mL irrigation  Status:  Discontinued       As needed 01/25/17 1526 01/25/17 1614   01/25/17 1300  gentamicin (GARAMYCIN) 80 mg in sodium chloride irrigation 0.9 % 500 mL irrigation     80 mg Irrigation To Cath Lab 01/25/17 1240 01/25/17 1525   01/25/17 1300  ceFAZolin (ANCEF) IVPB 2g/100 mL premix     2 g 200 mL/hr over 30 Minutes Intravenous To Cath Lab 01/25/17 1240 01/25/17 1421   01/24/17 1030  gentamicin (GARAMYCIN) 80 mg in sodium chloride irrigation 0.9 % 500 mL irrigation  Status:  Discontinued     80 mg Irrigation To Cath Lab 01/24/17 0944 01/24/17 1451   01/24/17 0945  ceFAZolin (ANCEF) IVPB 2g/100 mL premix  Status:  Discontinued     2 g 200 mL/hr over 30 Minutes Intravenous On call 01/24/17 0944 01/24/17 1451   01/23/17 1600  ceFAZolin (ANCEF) IVPB 2g/100 mL premix     2 g 200 mL/hr over 30 Minutes Intravenous Every 8 hours 01/23/17 1331     01/22/17 1100  ceFAZolin (ANCEF) IVPB 2g/100 mL premix  Status:  Discontinued     2 g 200 mL/hr over 30 Minutes Intravenous Every 12 hours 01/22/17 1046 01/23/17 1331   01/22/17 0500  vancomycin (VANCOCIN) IVPB 750 mg/150 ml premix  Status:  Discontinued     750 mg 150 mL/hr over 60 Minutes Intravenous Every 24 hours 01/21/17 1107 01/22/17 1030   01/22/17 0000  piperacillin-tazobactam (ZOSYN) IVPB 2.25 g  Status:  Discontinued     2.25 g 100 mL/hr over 30 Minutes Intravenous Every 8 hours 01/21/17 2305 01/22/17 1030   01/21/17 1115  vancomycin (VANCOCIN) IVPB 1000 mg/200 mL premix     1,000 mg 200 mL/hr over 60 Minutes Intravenous  Once 01/21/17 1104 01/21/17 1257   01/21/17 1100  piperacillin-tazobactam (ZOSYN) IVPB 3.375 g     3.375 g 100 mL/hr over 30 Minutes Intravenous  Once 01/21/17 1051 01/21/17 1150       Objective: Vitals:   01/26/17 0748 01/26/17 0928 01/26/17 1112 01/26/17 1556  BP: 104/68 123/76 108/90 116/71  Pulse: (!) 59 60 60 60  Resp:  (!) 27 18 (!) 29  Temp: 98.3 F (36.8 C)   98.3 F (36.8 C) 98.6 F (37 C)  TempSrc: Oral  Oral Oral  SpO2: 100% 100% 100% 98%  Weight:      Height:        Intake/Output Summary (Last 24 hours) at 01/26/17 1751 Last data filed at 01/26/17 1510  Gross per 24 hour  Intake              420 ml  Output              650 ml  Net             -230 ml   Filed Weights   01/24/17 2144 01/25/17 0855 01/26/17 0422  Weight: 68.2 kg (150 lb 5.7 oz) 68 kg (150 lb) 67.1 kg (147 lb 14.4 oz)  Examination: General exam: Appears comfortable - still somnolent at this time HEENT: PERRLA, oral mucosa moist, no sclera icterus or thrush Respiratory system: Clear to auscultation. Respiratory effort normal. Equal chest rise. Cardiovascular system: S1 & S2 heard, RRR.  No murmurs  Gastrointestinal system: Abdomen soft, non-tender, nondistended. Normal bowel sound. No organomegaly Central nervous system:   No focal neurological deficits. Extremities: No cyanosis, clubbing or edema Skin:   ulcer in skin above pacemaker Psychiatry:  Difficult to assess as sleepy    Data Reviewed: I have personally reviewed following labs and imaging studies  CBC:  Recent Labs Lab 01/21/17 1030 01/22/17 0509 01/23/17 0324 01/24/17 0605 01/25/17 0213 01/26/17 0535  WBC 11.4* 11.6* 11.7* 15.1* 15.0* 14.1*  NEUTROABS 9.8*  --   --   --   --   --   HGB 14.3 13.8 13.6 13.3 12.9* 11.9*  HCT 41.0 39.2 39.7 37.9* 36.5* 36.3*  MCV 83.5 82.7 83.4 81.0 81.5 84.2  PLT 193 161 183 160 209 340   Basic Metabolic Panel:  Recent Labs Lab 01/21/17 2329 01/22/17 0509 01/23/17 0324 01/24/17 0605 01/26/17 0535  NA 131* 132* 136 133* 139  K 4.1 3.7 3.8 4.1 4.7  CL 103 107 107 109 109  CO2 15* 14* 21* 16* 20*  GLUCOSE 142* 135* 142* 149* 135*  BUN 75* 77* 68* 61* 53*  CREATININE 2.65* 2.60* 2.18* 1.88* 1.62*  CALCIUM 9.0 8.7* 8.8* 8.4* 8.6*   GFR: Estimated Creatinine Clearance: 37.4 mL/min (A) (by C-G formula based on SCr of 1.62 mg/dL (H)). Liver Function  Tests:  Recent Labs Lab 01/21/17 1030 01/24/17 0605  AST 173* 78*  ALT 83* 29  ALKPHOS 54 47  BILITOT 1.5* 0.9  PROT 8.1 5.8*  ALBUMIN 3.3* 2.0*   No results for input(s): LIPASE, AMYLASE in the last 168 hours. No results for input(s): AMMONIA in the last 168 hours. Coagulation Profile:  Recent Labs Lab 01/22/17 0509 01/23/17 0324 01/24/17 0605 01/25/17 1249 01/26/17 0535  INR 1.92 2.01 2.02 2.41 3.10   Cardiac Enzymes:  Recent Labs Lab 01/21/17 1030 01/21/17 1530 01/21/17 2329 01/22/17 0509 01/22/17 0925  TROPONINI 0.18* 0.18* 0.17* 0.15* 0.13*   BNP (last 3 results) No results for input(s): PROBNP in the last 8760 hours. HbA1C: No results for input(s): HGBA1C in the last 72 hours. CBG:  Recent Labs Lab 01/22/17 0834 01/23/17 0738 01/24/17 0737 01/25/17 0803 01/26/17 0746  GLUCAP 161* 126* 156* 133* 169*   Lipid Profile: No results for input(s): CHOL, HDL, LDLCALC, TRIG, CHOLHDL, LDLDIRECT in the last 72 hours. Thyroid Function Tests: No results for input(s): TSH, T4TOTAL, FREET4, T3FREE, THYROIDAB in the last 72 hours. Anemia Panel: No results for input(s): VITAMINB12, FOLATE, FERRITIN, TIBC, IRON, RETICCTPCT in the last 72 hours. Urine analysis:    Component Value Date/Time   COLORURINE YELLOW 01/21/2017 1215   APPEARANCEUR CLOUDY (A) 01/21/2017 1215   LABSPEC 1.025 01/21/2017 1215   PHURINE 5.5 01/21/2017 1215   GLUCOSEU NEGATIVE 01/21/2017 1215   HGBUR LARGE (A) 01/21/2017 1215   BILIRUBINUR NEGATIVE 01/21/2017 1215   KETONESUR NEGATIVE 01/21/2017 1215   PROTEINUR >300 (A) 01/21/2017 1215   NITRITE NEGATIVE 01/21/2017 1215   LEUKOCYTESUR NEGATIVE 01/21/2017 1215   Sepsis Labs: @LABRCNTIP (procalcitonin:4,lacticidven:4) ) Recent Results (from the past 240 hour(s))  Culture, blood (routine x 2)     Status: Abnormal   Collection Time: 01/21/17 10:50 AM  Result Value Ref Range Status   Specimen Description BLOOD LEFT FOREARM  Final    Special Requests   Final    BOTTLES DRAWN AEROBIC AND ANAEROBIC Blood Culture adequate volume   Culture  Setup Time   Final    GRAM POSITIVE COCCI IN CLUSTERS IN BOTH AEROBIC AND ANAEROBIC BOTTLES CRITICAL VALUE NOTED.  VALUE IS CONSISTENT WITH PREVIOUSLY REPORTED AND CALLED VALUE.    Culture (A)  Final    STAPHYLOCOCCUS AUREUS SUSCEPTIBILITIES PERFORMED ON PREVIOUS CULTURE WITHIN THE LAST 5 DAYS. Performed at [facility], [address] [place], [place] [address]    Report Status 01/24/2017 FINAL  Final  Culture, blood (routine x 2)     Status: Abnormal   Collection Time: 01/21/17 11:08 AM  Result Value Ref Range Status   Specimen Description BLOOD LEFT WRIST  Final   Special Requests   Final    BOTTLES DRAWN AEROBIC AND ANAEROBIC Blood Culture adequate volume   Culture  Setup Time   Final    GRAM POSITIVE COCCI IN CLUSTERS IN BOTH AEROBIC AND ANAEROBIC BOTTLES CRITICAL RESULT CALLED TO, READ BACK BY AND VERIFIED WITH: K.COOK PHARMD 01/22/17 0408 L.CHAMPION Performed at [facility], [address], [place], [place] [address]    Culture STAPHYLOCOCCUS AUREUS (A)  Final   Report Status 01/24/2017 FINAL  Final   Organism ID, Bacteria STAPHYLOCOCCUS AUREUS  Final      Susceptibility   Staphylococcus aureus - MIC*    CIPROFLOXACIN <=0.5 SENSITIVE Sensitive     ERYTHROMYCIN <=0.25 SENSITIVE Sensitive     GENTAMICIN <=0.5 SENSITIVE Sensitive     OXACILLIN 0.5 SENSITIVE Sensitive     TETRACYCLINE <=1 SENSITIVE Sensitive     VANCOMYCIN <=0.5 SENSITIVE Sensitive     TRIMETH/SULFA <=10 SENSITIVE Sensitive     CLINDAMYCIN <=0.25 SENSITIVE Sensitive     RIFAMPIN <=0.5 SENSITIVE Sensitive     Inducible Clindamycin NEGATIVE Sensitive     * STAPHYLOCOCCUS AUREUS  Blood Culture ID Panel (Reflexed)     Status: Abnormal   Collection Time: 01/21/17 11:08 AM  Result Value Ref Range Status   Enterococcus species NOT DETECTED NOT DETECTED Final   Vancomycin resistance NOT  DETECTED NOT DETECTED Final   Listeria monocytogenes NOT DETECTED NOT DETECTED Final   Staphylococcus species DETECTED (A) NOT DETECTED Final    Comment: CRITICAL RESULT CALLED TO, READ BACK BY AND VERIFIED WITH: K.COOK PHARMD 01/22/17 0408 L.CHAMPION    Staphylococcus aureus DETECTED (A) NOT DETECTED Final    Comment: CRITICAL RESULT CALLED TO, READ BACK BY AND VERIFIED WITH: K.COOK PHARMD 01/22/17 0408 L.CHAMPION    Methicillin resistance NOT DETECTED NOT DETECTED Final   Streptococcus species NOT DETECTED NOT DETECTED Final   Streptococcus agalactiae NOT DETECTED NOT DETECTED Final   Streptococcus pneumoniae NOT DETECTED NOT DETECTED Final   Streptococcus pyogenes NOT DETECTED NOT DETECTED Final   Acinetobacter baumannii NOT DETECTED NOT DETECTED Final   Enterobacteriaceae species NOT DETECTED NOT DETECTED Final   Enterobacter cloacae complex NOT DETECTED NOT DETECTED Final   Escherichia coli NOT DETECTED NOT DETECTED Final   Klebsiella oxytoca NOT DETECTED NOT DETECTED Final   Klebsiella pneumoniae NOT DETECTED NOT DETECTED Final   Proteus species NOT DETECTED NOT DETECTED Final   Serratia marcescens NOT DETECTED NOT DETECTED Final   Carbapenem resistance NOT DETECTED NOT DETECTED Final   Haemophilus influenzae NOT DETECTED NOT DETECTED Final   Neisseria meningitidis NOT DETECTED NOT DETECTED Final   Pseudomonas aeruginosa NOT DETECTED NOT DETECTED Final   Candida albicans NOT DETECTED  NOT DETECTED Final   Candida glabrata NOT DETECTED NOT DETECTED Final   Candida krusei NOT DETECTED NOT DETECTED Final   Candida parapsilosis NOT DETECTED NOT DETECTED Final   Candida tropicalis NOT DETECTED NOT DETECTED Final    Comment: Performed at [facility], [address], [place], [place] [address]  Urine culture     Status: Abnormal   Collection Time: 01/21/17 12:15 PM  Result Value Ref Range Status   Specimen Description URINE, CLEAN CATCH  Final   Special Requests NONE  Final    Culture 70,000 COLONIES/mL STAPHYLOCOCCUS AUREUS (A)  Final   Report Status 01/23/2017 FINAL  Final   Organism ID, Bacteria STAPHYLOCOCCUS AUREUS (A)  Final      Susceptibility   Staphylococcus aureus - MIC*    CIPROFLOXACIN <=0.5 SENSITIVE Sensitive     GENTAMICIN <=0.5 SENSITIVE Sensitive     NITROFURANTOIN 32 SENSITIVE Sensitive     OXACILLIN <=0.25 SENSITIVE Sensitive     TETRACYCLINE <=1 SENSITIVE Sensitive     VANCOMYCIN <=0.5 SENSITIVE Sensitive     TRIMETH/SULFA <=10 SENSITIVE Sensitive     CLINDAMYCIN <=0.25 SENSITIVE Sensitive     RIFAMPIN <=0.5 SENSITIVE Sensitive     Inducible Clindamycin NEGATIVE Sensitive     * 70,000 COLONIES/mL STAPHYLOCOCCUS AUREUS  Surgical pcr screen     Status: Abnormal   Collection Time: 01/24/17  1:35 AM  Result Value Ref Range Status   MRSA, PCR NEGATIVE NEGATIVE Final   Staphylococcus aureus POSITIVE (A) NEGATIVE Final    Comment: (NOTE) The Xpert SA Assay (FDA approved for NASAL specimens in patients 28 years of age and older), is one component of a comprehensive surveillance program. It is not intended to diagnose infection nor to guide or monitor treatment.   Culture, blood (Routine X 2) w Reflex to ID Panel     Status: None (Preliminary result)   Collection Time: 01/24/17  9:45 AM  Result Value Ref Range Status   Specimen Description BLOOD RIGHT HAND  Final   Special Requests IN PEDIATRIC BOTTLE Blood Culture adequate volume  Final   Culture  Setup Time   Final    GRAM POSITIVE COCCI IN CLUSTERS IN PEDIATRIC BOTTLE CRITICAL RESULT CALLED TO, READ BACK BY AND VERIFIED WITH: L. BAJBUS PHARMD, AT 0748 01/25/17 BY D. VANHOOK    Culture GRAM POSITIVE COCCI IN CLUSTERS  Final   Report Status PENDING  Incomplete  Blood Culture ID Panel (Reflexed)     Status: Abnormal   Collection Time: 01/24/17  9:45 AM  Result Value Ref Range Status   Enterococcus species NOT DETECTED NOT DETECTED Final   Listeria monocytogenes NOT DETECTED NOT DETECTED  Final   Staphylococcus species DETECTED (A) NOT DETECTED Final    Comment: CRITICAL RESULT CALLED TO, READ BACK BY AND VERIFIED WITH: L. BAJBUS PHARMD, AT 6045 01/25/17 BY D. VANHOOK    Staphylococcus aureus DETECTED (A) NOT DETECTED Final    Comment: Methicillin (oxacillin) susceptible Staphylococcus aureus (MSSA). Preferred therapy is anti staphylococcal beta lactam antibiotic (Cefazolin or Nafcillin), unless clinically contraindicated. CRITICAL RESULT CALLED TO, READ BACK BY AND VERIFIED WITH: L. BAJBUS PHARMD, AT 4098 01/25/17 BY D. VANHOOK    Methicillin resistance NOT DETECTED NOT DETECTED Final   Streptococcus species NOT DETECTED NOT DETECTED Final   Streptococcus agalactiae NOT DETECTED NOT DETECTED Final   Streptococcus pneumoniae NOT DETECTED NOT DETECTED Final   Streptococcus pyogenes NOT DETECTED NOT DETECTED Final   Acinetobacter baumannii NOT DETECTED  NOT DETECTED Final   Enterobacteriaceae species NOT DETECTED NOT DETECTED Final   Enterobacter cloacae complex NOT DETECTED NOT DETECTED Final   Escherichia coli NOT DETECTED NOT DETECTED Final   Klebsiella oxytoca NOT DETECTED NOT DETECTED Final   Klebsiella pneumoniae NOT DETECTED NOT DETECTED Final   Proteus species NOT DETECTED NOT DETECTED Final   Serratia marcescens NOT DETECTED NOT DETECTED Final   Haemophilus influenzae NOT DETECTED NOT DETECTED Final   Neisseria meningitidis NOT DETECTED NOT DETECTED Final   Pseudomonas aeruginosa NOT DETECTED NOT DETECTED Final   Candida albicans NOT DETECTED NOT DETECTED Final   Candida glabrata NOT DETECTED NOT DETECTED Final   Candida krusei NOT DETECTED NOT DETECTED Final   Candida parapsilosis NOT DETECTED NOT DETECTED Final   Candida tropicalis NOT DETECTED NOT DETECTED Final         Radiology Studies: Dg Chest 2 View  Result Date: 01/26/2017 CLINICAL DATA:  Status post permanent pacemaker change out. EXAM: CHEST  2 VIEW COMPARISON:  Portable chest x-ray of January 21, 2017 FINDINGS: The previous pacemaker and electrodes have been removed. A single electrode has been placed with the tip in the right ventricular apex. The generator lies above the left clavicle. The lungs are adequately inflated. There is no postprocedure pneumothorax. There is stable scarring at the left lung base. The heart is top-normal in size. The pulmonary vascularity is normal. There is calcification in the wall of the aortic arch. The patient has undergone previous CABG. IMPRESSION: No postprocedure complication following permanent pacemaker change out. Previous CABG.  Thoracic aortic atherosclerosis. Electronically Signed   By: David  [place] M.D.   On: 01/26/2017 07:25      Scheduled Meds: . carvedilol  3.125 mg Oral BID  . mupirocin ointment  1 application Nasal BID  . senna-docusate  1 tablet Oral BID  . sodium chloride flush  3 mL Intravenous Q12H   Continuous Infusions: . sodium chloride    .  ceFAZolin (ANCEF) IV Stopped (01/26/17 1510)     LOS: 5 days    Time spent in minutes: 35 min    Penny Pia, MD [facility] Pager: [email or web address] Password [facility] 01/26/2017, 5:51 PM

## 2017-01-27 LAB — BASIC METABOLIC PANEL
Anion gap: 7 (ref 5–15)
BUN: 43 mg/dL — AB (ref 6–20)
CALCIUM: 8.4 mg/dL — AB (ref 8.9–10.3)
CO2: 21 mmol/L — ABNORMAL LOW (ref 22–32)
CREATININE: 1.41 mg/dL — AB (ref 0.61–1.24)
Chloride: 106 mmol/L (ref 101–111)
GFR, EST AFRICAN AMERICAN: 55 mL/min — AB (ref >60)
GFR, EST NON AFRICAN AMERICAN: 47 mL/min — AB (ref >60)
Glucose, Bld: 128 mg/dL — ABNORMAL HIGH (ref 65–99)
Potassium: 4.8 mmol/L (ref 3.5–5.1)
SODIUM: 134 mmol/L — AB (ref 135–145)

## 2017-01-27 LAB — GLUCOSE, CAPILLARY: GLUCOSE-CAPILLARY: 181 mg/dL — AB (ref 65–99)

## 2017-01-27 LAB — PROTIME-INR
INR: 3.03
PROTHROMBIN TIME: 31.1 s — AB (ref 11.4–15.2)

## 2017-01-27 LAB — CULTURE, BLOOD (ROUTINE X 2): SPECIAL REQUESTS: ADEQUATE

## 2017-01-27 NOTE — Progress Notes (Signed)
INFECTIOUS DISEASE PROGRESS NOTE  ID: Patrick Knox is a 76 y.o. male with  Principal Problem:   Sepsis ([place]) Active Problems:   Hypertension   High cholesterol   Paroxysmal atrial fibrillation (HCC)   Acute renal failure superimposed on stage 3 chronic kidney disease (HCC)   Chronic systolic CHF (congestive heart failure) (HCC)   Hypotension   Acute metabolic encephalopathy   Hyponatremia   Elevated troponin   Tobacco abuse  Subjective: C/o abd pain after doing exercises.   Abtx:  Anti-infectives    Start     Dose/Rate Route Frequency Ordered Stop   01/25/17 1525  gentamicin (GARAMYCIN) 80 mg in sodium chloride irrigation 0.9 % 500 mL irrigation  Status:  Discontinued       As needed 01/25/17 1526 01/25/17 1614   01/25/17 1300  gentamicin (GARAMYCIN) 80 mg in sodium chloride irrigation 0.9 % 500 mL irrigation     80 mg Irrigation To Cath Lab 01/25/17 1240 01/25/17 1525   01/25/17 1300  ceFAZolin (ANCEF) IVPB 2g/100 mL premix     2 g 200 mL/hr over 30 Minutes Intravenous To Cath Lab 01/25/17 1240 01/25/17 1421   01/24/17 1030  gentamicin (GARAMYCIN) 80 mg in sodium chloride irrigation 0.9 % 500 mL irrigation  Status:  Discontinued     80 mg Irrigation To Cath Lab 01/24/17 0944 01/24/17 1451   01/24/17 0945  ceFAZolin (ANCEF) IVPB 2g/100 mL premix  Status:  Discontinued     2 g 200 mL/hr over 30 Minutes Intravenous On call 01/24/17 0944 01/24/17 1451   01/23/17 1600  ceFAZolin (ANCEF) IVPB 2g/100 mL premix     2 g 200 mL/hr over 30 Minutes Intravenous Every 8 hours 01/23/17 1331     01/22/17 1100  ceFAZolin (ANCEF) IVPB 2g/100 mL premix  Status:  Discontinued     2 g 200 mL/hr over 30 Minutes Intravenous Every 12 hours 01/22/17 1046 01/23/17 1331   01/22/17 0500  vancomycin (VANCOCIN) IVPB 750 mg/150 ml premix  Status:  Discontinued     750 mg 150 mL/hr over 60 Minutes Intravenous Every 24 hours 01/21/17 1107 01/22/17 1030   01/22/17 0000  piperacillin-tazobactam  (ZOSYN) IVPB 2.25 g  Status:  Discontinued     2.25 g 100 mL/hr over 30 Minutes Intravenous Every 8 hours 01/21/17 2305 01/22/17 1030   01/21/17 1115  vancomycin (VANCOCIN) IVPB 1000 mg/200 mL premix     1,000 mg 200 mL/hr over 60 Minutes Intravenous  Once 01/21/17 1104 01/21/17 1257   01/21/17 1100  piperacillin-tazobactam (ZOSYN) IVPB 3.375 g     3.375 g 100 mL/hr over 30 Minutes Intravenous  Once 01/21/17 1051 01/21/17 1150      Medications:  Scheduled: . carvedilol  3.125 mg Oral BID  . mupirocin ointment  1 application Nasal BID  . senna-docusate  1 tablet Oral BID  . sodium chloride flush  3 mL Intravenous Q12H    Objective: Vital signs in last 24 hours: Temp:  [98 F (36.7 C)-98.6 F (37 C)] 98 F (36.7 C) (09/27 0728) Pulse Rate:  [58-60] 58 (09/27 0728) Resp:  [17-29] 17 (09/27 0728) BP: (116-128)/(69-75) 128/69 (09/27 0728) SpO2:  [98 %-100 %] 100 % (09/27 0728) Weight:  [68.3 kg (150 lb 8 oz)] 68.3 kg (150 lb 8 oz) (09/27 0436)   General appearance: alert, cooperative and no distress Resp: clear to auscultation bilaterally Chest wall: no tenderness, wounds dressed.  Cardio: regular rate and rhythm GI: normal findings: bowel  sounds normal and soft, non-tender  Lab Results  Recent Labs  01/25/17 0213 01/26/17 0535 01/27/17 0543  WBC 15.0* 14.1*  --   HGB 12.9* 11.9*  --   HCT 36.5* 36.3*  --   NA  --  139 134*  K  --  4.7 4.8  CL  --  109 106  CO2  --  20* 21*  BUN  --  53* 43*  CREATININE  --  1.62* 1.41*   Liver Panel No results for input(s): PROT, ALBUMIN, AST, ALT, ALKPHOS, BILITOT, BILIDIR, IBILI in the last 72 hours. Sedimentation Rate No results for input(s): ESRSEDRATE in the last 72 hours. C-Reactive Protein No results for input(s): CRP in the last 72 hours.  Microbiology: Recent Results (from the past 240 hour(s))  Culture, blood (routine x 2)     Status: Abnormal   Collection Time: 01/21/17 10:50 AM  Result Value Ref Range Status     Specimen Description BLOOD LEFT FOREARM  Final   Special Requests   Final    BOTTLES DRAWN AEROBIC AND ANAEROBIC Blood Culture adequate volume   Culture  Setup Time   Final    GRAM POSITIVE COCCI IN CLUSTERS IN BOTH AEROBIC AND ANAEROBIC BOTTLES CRITICAL VALUE NOTED.  VALUE IS CONSISTENT WITH PREVIOUSLY REPORTED AND CALLED VALUE.    Culture (A)  Final    STAPHYLOCOCCUS AUREUS SUSCEPTIBILITIES PERFORMED ON PREVIOUS CULTURE WITHIN THE LAST 5 DAYS. Performed at [facility], [place] [address], [place], [place] [address]    Report Status 01/24/2017 FINAL  Final  Culture, blood (routine x 2)     Status: Abnormal   Collection Time: 01/21/17 11:08 AM  Result Value Ref Range Status   Specimen Description BLOOD LEFT WRIST  Final   Special Requests   Final    BOTTLES DRAWN AEROBIC AND ANAEROBIC Blood Culture adequate volume   Culture  Setup Time   Final    GRAM POSITIVE COCCI IN CLUSTERS IN BOTH AEROBIC AND ANAEROBIC BOTTLES CRITICAL RESULT CALLED TO, READ BACK BY AND VERIFIED WITH: K.COOK PHARMD 01/22/17 0408 L.CHAMPION Performed at [facility], [place] [address] [place], [place] [address]    Culture STAPHYLOCOCCUS AUREUS (A)  Final   Report Status 01/24/2017 FINAL  Final   Organism ID, Bacteria STAPHYLOCOCCUS AUREUS  Final      Susceptibility   Staphylococcus aureus - MIC*    CIPROFLOXACIN <=0.5 SENSITIVE Sensitive     ERYTHROMYCIN <=0.25 SENSITIVE Sensitive     GENTAMICIN <=0.5 SENSITIVE Sensitive     OXACILLIN 0.5 SENSITIVE Sensitive     TETRACYCLINE <=1 SENSITIVE Sensitive     VANCOMYCIN <=0.5 SENSITIVE Sensitive     TRIMETH/SULFA <=10 SENSITIVE Sensitive     CLINDAMYCIN <=0.25 SENSITIVE Sensitive     RIFAMPIN <=0.5 SENSITIVE Sensitive     Inducible Clindamycin NEGATIVE Sensitive     * STAPHYLOCOCCUS AUREUS  Blood Culture ID Panel (Reflexed)     Status: Abnormal   Collection Time: 01/21/17 11:08 AM  Result Value Ref Range Status   Enterococcus species NOT DETECTED  NOT DETECTED Final   Vancomycin resistance NOT DETECTED NOT DETECTED Final   Listeria monocytogenes NOT DETECTED NOT DETECTED Final   Staphylococcus species DETECTED (A) NOT DETECTED Final    Comment: CRITICAL RESULT CALLED TO, READ BACK BY AND VERIFIED WITH: K.COOK PHARMD 01/22/17 0408 L.CHAMPION    Staphylococcus aureus DETECTED (A) NOT DETECTED Final    Comment: CRITICAL RESULT CALLED TO, READ BACK BY  AND VERIFIED WITH: K.COOK PHARMD 01/22/17 0408 L.CHAMPION    Methicillin resistance NOT DETECTED NOT DETECTED Final   Streptococcus species NOT DETECTED NOT DETECTED Final   Streptococcus agalactiae NOT DETECTED NOT DETECTED Final   Streptococcus pneumoniae NOT DETECTED NOT DETECTED Final   Streptococcus pyogenes NOT DETECTED NOT DETECTED Final   Acinetobacter baumannii NOT DETECTED NOT DETECTED Final   Enterobacteriaceae species NOT DETECTED NOT DETECTED Final   Enterobacter cloacae complex NOT DETECTED NOT DETECTED Final   Escherichia coli NOT DETECTED NOT DETECTED Final   Klebsiella oxytoca NOT DETECTED NOT DETECTED Final   Klebsiella pneumoniae NOT DETECTED NOT DETECTED Final   Proteus species NOT DETECTED NOT DETECTED Final   Serratia marcescens NOT DETECTED NOT DETECTED Final   Carbapenem resistance NOT DETECTED NOT DETECTED Final   Haemophilus influenzae NOT DETECTED NOT DETECTED Final   Neisseria meningitidis NOT DETECTED NOT DETECTED Final   Pseudomonas aeruginosa NOT DETECTED NOT DETECTED Final   Candida albicans NOT DETECTED NOT DETECTED Final   Candida glabrata NOT DETECTED NOT DETECTED Final   Candida krusei NOT DETECTED NOT DETECTED Final   Candida parapsilosis NOT DETECTED NOT DETECTED Final   Candida tropicalis NOT DETECTED NOT DETECTED Final    Comment: Performed at [facility], [place] [address] [place], [place] [address]  Urine culture     Status: Abnormal   Collection Time: 01/21/17 12:15 PM  Result Value Ref Range Status   Specimen Description URINE, CLEAN  CATCH  Final   Special Requests NONE  Final   Culture 70,000 COLONIES/mL STAPHYLOCOCCUS AUREUS (A)  Final   Report Status 01/23/2017 FINAL  Final   Organism ID, Bacteria STAPHYLOCOCCUS AUREUS (A)  Final      Susceptibility   Staphylococcus aureus - MIC*    CIPROFLOXACIN <=0.5 SENSITIVE Sensitive     GENTAMICIN <=0.5 SENSITIVE Sensitive     NITROFURANTOIN 32 SENSITIVE Sensitive     OXACILLIN <=0.25 SENSITIVE Sensitive     TETRACYCLINE <=1 SENSITIVE Sensitive     VANCOMYCIN <=0.5 SENSITIVE Sensitive     TRIMETH/SULFA <=10 SENSITIVE Sensitive     CLINDAMYCIN <=0.25 SENSITIVE Sensitive     RIFAMPIN <=0.5 SENSITIVE Sensitive     Inducible Clindamycin NEGATIVE Sensitive     * 70,000 COLONIES/mL STAPHYLOCOCCUS AUREUS  Surgical pcr screen     Status: Abnormal   Collection Time: 01/24/17  1:35 AM  Result Value Ref Range Status   MRSA, PCR NEGATIVE NEGATIVE Final   Staphylococcus aureus POSITIVE (A) NEGATIVE Final    Comment: (NOTE) The Xpert SA Assay (FDA approved for NASAL specimens in patients 55 years of age and older), is one component of a comprehensive surveillance program. It is not intended to diagnose infection nor to guide or monitor treatment.   Culture, blood (Routine X 2) w Reflex to ID Panel     Status: Abnormal   Collection Time: 01/24/17  9:45 AM  Result Value Ref Range Status   Specimen Description BLOOD RIGHT HAND  Final   Special Requests IN PEDIATRIC BOTTLE Blood Culture adequate volume  Final   Culture  Setup Time   Final    GRAM POSITIVE COCCI IN CLUSTERS IN PEDIATRIC BOTTLE CRITICAL RESULT CALLED TO, READ BACK BY AND VERIFIED WITH: L. BAJBUS PHARMD, AT 5631 01/25/17 BY D. VANHOOK    Culture STAPHYLOCOCCUS AUREUS (A)  Final   Report Status 01/27/2017 FINAL  Final   Organism ID, Bacteria STAPHYLOCOCCUS AUREUS  Final      Susceptibility  Staphylococcus aureus - MIC*    CIPROFLOXACIN <=0.5 SENSITIVE Sensitive     ERYTHROMYCIN <=0.25 SENSITIVE Sensitive      GENTAMICIN <=0.5 SENSITIVE Sensitive     OXACILLIN 0.5 SENSITIVE Sensitive     TETRACYCLINE <=1 SENSITIVE Sensitive     VANCOMYCIN <=0.5 SENSITIVE Sensitive     TRIMETH/SULFA <=10 SENSITIVE Sensitive     CLINDAMYCIN <=0.25 SENSITIVE Sensitive     RIFAMPIN <=0.5 SENSITIVE Sensitive     Inducible Clindamycin NEGATIVE Sensitive     * STAPHYLOCOCCUS AUREUS  Blood Culture ID Panel (Reflexed)     Status: Abnormal   Collection Time: 01/24/17  9:45 AM  Result Value Ref Range Status   Enterococcus species NOT DETECTED NOT DETECTED Final   Listeria monocytogenes NOT DETECTED NOT DETECTED Final   Staphylococcus species DETECTED (A) NOT DETECTED Final    Comment: CRITICAL RESULT CALLED TO, READ BACK BY AND VERIFIED WITH: L. BAJBUS PHARMD, AT [phone] 01/25/17 BY D. VANHOOK    Staphylococcus aureus DETECTED (A) NOT DETECTED Final    Comment: Methicillin (oxacillin) susceptible Staphylococcus aureus (MSSA). Preferred therapy is anti staphylococcal beta lactam antibiotic (Cefazolin or Nafcillin), unless clinically contraindicated. CRITICAL RESULT CALLED TO, READ BACK BY AND VERIFIED WITH: L. BAJBUS PHARMD, AT [phone] 01/25/17 BY D. VANHOOK    Methicillin resistance NOT DETECTED NOT DETECTED Final   Streptococcus species NOT DETECTED NOT DETECTED Final   Streptococcus agalactiae NOT DETECTED NOT DETECTED Final   Streptococcus pneumoniae NOT DETECTED NOT DETECTED Final   Streptococcus pyogenes NOT DETECTED NOT DETECTED Final   Acinetobacter baumannii NOT DETECTED NOT DETECTED Final   Enterobacteriaceae species NOT DETECTED NOT DETECTED Final   Enterobacter cloacae complex NOT DETECTED NOT DETECTED Final   Escherichia coli NOT DETECTED NOT DETECTED Final   Klebsiella oxytoca NOT DETECTED NOT DETECTED Final   Klebsiella pneumoniae NOT DETECTED NOT DETECTED Final   Proteus species NOT DETECTED NOT DETECTED Final   Serratia marcescens NOT DETECTED NOT DETECTED Final   Haemophilus influenzae NOT DETECTED NOT  DETECTED Final   Neisseria meningitidis NOT DETECTED NOT DETECTED Final   Pseudomonas aeruginosa NOT DETECTED NOT DETECTED Final   Candida albicans NOT DETECTED NOT DETECTED Final   Candida glabrata NOT DETECTED NOT DETECTED Final   Candida krusei NOT DETECTED NOT DETECTED Final   Candida parapsilosis NOT DETECTED NOT DETECTED Final   Candida tropicalis NOT DETECTED NOT DETECTED Final  Culture, blood (Routine X 2) w Reflex to ID Panel     Status: None (Preliminary result)   Collection Time: 01/26/17  3:01 PM  Result Value Ref Range Status   Specimen Description BLOOD RIGHT HAND  Final   Special Requests IN PEDIATRIC BOTTLE Blood Culture adequate volume  Final   Culture NO GROWTH < 24 HOURS  Final   Report Status PENDING  Incomplete    Studies/Results: Dg Chest 2 View  Result Date: 01/26/2017 CLINICAL DATA:  Status post permanent pacemaker change out. EXAM: CHEST  2 VIEW COMPARISON:  Portable chest x-ray of January 21, 2017 FINDINGS: The previous pacemaker and electrodes have been removed. A single electrode has been placed with the tip in the right ventricular apex. The generator lies above the left clavicle. The lungs are adequately inflated. There is no postprocedure pneumothorax. There is stable scarring at the left lung base. The heart is top-normal in size. The pulmonary vascularity is normal. There is calcification in the wall of the aortic arch. The patient has undergone previous CABG. IMPRESSION: No postprocedure complication following  permanent pacemaker change out. Previous CABG.  Thoracic aortic atherosclerosis. Electronically Signed   By: David  Martinique M.D.   On: 01/26/2017 07:25     Assessment/Plan: Pacemaker endocarditis             Extracted 9-25 Back Pain MSSA bacteremia  Total days of antibiotics: 6 ancef  Would plan on 6 weeks of ancef His Cr continues to improve (normal in Dec) CT of spine shows no spinal lesion but does show necrotic lesion in R lung. Needs  repeat, dedicated CT.               Hopefully his BCx will clear with removal of pacer Repeat BCx are pending.     Would suggest re-implantation when BCx negative (there is no clear guideline on this)                Available as needed.    No Known Allergies  OPAT Orders Discharge antibiotics: ancef 2g ivpb q8h  Duration: 6 weeks  End Date: 03-08-17  [facility] Care Per Protocol:  Labs weekly while on IV antibiotics: _x_ CBC with differential __ BMP _x_ CMP __ CRP __ ESR __ Vancomycin trough  _x_ Please pull PIC at completion of IV antibiotics __ Please leave PIC in place until doctor has seen patient or been notified  Fax weekly labs to [phone]  Clinic Follow Up Appt: ID clinic Creola Corn MD, FACP Infectious Diseases (pager) [phone] [email or web address] 01/27/2017, 2:27 PM  LOS: 6 days

## 2017-01-27 NOTE — Progress Notes (Signed)
Progress Note  Patient Name: Patrick Knox Date of Encounter: 01/27/2017  Primary Cardiologist: Jens Som  Subjective   C/w some back pain, otherwise has no complaints, denies SOB or CP, no pain at either the extraction site or temp-perm.  Inpatient Medications    Scheduled Meds: . carvedilol  3.125 mg Oral BID  . mupirocin ointment  1 application Nasal BID  . senna-docusate  1 tablet Oral BID  . sodium chloride flush  3 mL Intravenous Q12H   Continuous Infusions: . sodium chloride    .  ceFAZolin (ANCEF) IV Stopped (01/27/17 0500)   PRN Meds: sodium chloride, [DISCONTINUED] acetaminophen **OR** acetaminophen, acetaminophen, HYDROcodone-acetaminophen, ondansetron (ZOFRAN) IV, sodium chloride flush   Vital Signs    Vitals:   01/26/17 1556 01/26/17 2022 01/27/17 0436 01/27/17 0728  BP: 116/71 118/73 118/75 128/69  Pulse: 60 60 (!) 59 (!) 58  Resp: (!) Temp: 98.6 F (37 C) 98.5 F (36.9 C) 98.5 F (36.9 C) 98 F (36.7 C)  TempSrc: Oral Oral Oral Oral  SpO2: 98% 100% 100% 100%  Weight:   150 lb 8 oz (68.3 kg)   Height:        Intake/Output Summary (Last 24 hours) at 01/27/17 0953 Last data filed at 01/27/17 0900  Gross per 24 hour  Intake              580 ml  Output              800 ml  Net             -220 ml   Filed Weights   01/25/17 0855 01/26/17 0422 01/27/17 0436  Weight: 150 lb (68 kg) 147 lb 14.4 oz (67.1 kg) 150 lb 8 oz (68.3 kg)    Telemetry    asynchronously V pacing - Personally Reviewed  ECG    No new EKGs - Personally Reviewed  Physical Exam   GEN: No acute distress.  Thin and frail appearing, ill, alert today Neck: 7 cm JVD Cardiac: RRR, no murmurs, rubs, or gallops.  Respiratory: CTA b/l. Temp perm L neck site is stable, extraction site is stable, sutres remain in place GI: Soft, nontender, non-distended  MS: No edema; No deformity. Neuro:   Nonfocal , oriented to self, place, not year, knows the current  president Psych: Normal affect   Labs    Chemistry  Recent Labs Lab 01/21/17 1030  01/24/17 0605 01/26/17 0535 01/27/17 0543  NA 127*  < > 133* 139 134*  K 4.4  < > 4.1 4.7 4.8  CL 97*  < > 109 109 106  CO2 18*  < > 16* 20* 21*  GLUCOSE 192*  < > 149* 135* 128*  BUN 84*  < > 61* 53* 43*  CREATININE 3.40*  < > 1.88* 1.62* 1.41*  CALCIUM 9.1  < > 8.4* 8.6* 8.4*  PROT 8.1  --  5.8*  --   --   ALBUMIN 3.3*  --  2.0*  --   --   AST 173*  --  78*  --   --   ALT 83*  --  29  --   --   ALKPHOS 54  --  47  --   --   BILITOT 1.5*  --  0.9  --   --   GFRNONAA 16*  < > 33* 40* 47*  GFRAA 19*  < > 39* 46* 55*  ANIONGAP 12  < >  < > = values in this interval not displayed.   Hematology  Recent Labs Lab 01/24/17 0605 01/25/17 0213 01/26/17 0535  WBC 15.1* 15.0* 14.1*  RBC 4.68 4.48 4.31  HGB 13.3 12.9* 11.9*  HCT 37.9* 36.5* 36.3*  MCV 81.0 81.5 84.2  MCH 28.4 28.8 27.6  MCHC 35.1 35.3 32.8  RDW 15.5 16.1* 16.2*  PLT 160 209 340    Cardiac Enzymes  Recent Labs Lab 01/21/17 1530 01/21/17 2329 01/22/17 0509 01/22/17 0925  TROPONINI 0.18* 0.17* 0.15* 0.13*   No results for input(s): TROPIPOC in the last 168 hours.   BNP  Recent Labs Lab 01/21/17 2329  BNP 764.7*     DDimer No results for input(s): DDIMER in the last 168 hours.   Radiology    Dg Chest 2 View Result Date: 01/26/2017 CLINICAL DATA:  Status post permanent pacemaker change out. EXAM: CHEST  2 VIEW COMPARISON:  Portable chest x-ray of January 21, 2017 FINDINGS: The previous pacemaker and electrodes have been removed. A single electrode has been placed with the tip in the right ventricular apex. The generator lies above the left clavicle. The lungs are adequately inflated. There is no postprocedure pneumothorax. There is stable scarring at the left lung base. The heart is top-normal in size. The pulmonary vascularity is normal. There is calcification in the wall of the aortic arch. The  patient has undergone previous CABG. IMPRESSION: No postprocedure complication following permanent pacemaker change out. Previous CABG.  Thoracic aortic atherosclerosis. Electronically Signed   By: David  [place] M.D.   On: 01/26/2017 07:25    Cardiac Studies    initial and second BC with staph, 3rd drawn yesterday post PPM extraction  Patient Profile     76 y.o. male admitted with altered mental status, growing out staph aureus now, with drainage from PM pocket. He was hypotensive and this has resolved. His renal function has improved.  Assessment & Plan    1. MSSA with PM pocket infection      SJM BiVe pacer implanted 02/18/16     staph UTI as well     S/p PPM system extraction w/temp-perm implant 01/25/17      Approximately 11-12inches of wic removed from extraction site today, patient tolerated well, will plan to remove 12" or so every day      His MS and overall clinical status continues to improve. BMET looks OK, Creat continues downward VSS  2. CHB      Temp-perm in place, L IJ  Re-implant timing will follow ID recommendations, pending BC post extraction Planned for 6 weeks antibiotics Re-implant after antibiotics, or when cultures become negative?  3. Chronic systolic heart failure        he appears to be well compensated.   4. Paroxysmal Afib     CHA2DS2Vasc is 4     INR was 3.1 yesterday ??     Hold anticoagulation  5. CAD (remote CABG)     No anginal complaints  6. Back pain     Unable to do MRI with pacer     CT done yesterday, results pending   For questions or updates, please contact [facility] Please consult [email or web address] for contact info under Cardiology/STEMI.        Signed, Sheilah Pigeon, PA-C  01/27/2017, 9:53 AM  Patient ID: Patrick Knox, male   DOB: Aug 24, 1940, 76 y.o.   MRN: [id number]   Seen and examined   Await input from ID  as to  ABx duration and other imaging PAF currently in sinus  Will hold on anticoagulation

## 2017-01-27 NOTE — Clinical Social Work Note (Signed)
Clinical Social Work Assessment  Patient Details  Name: Patrick Knox MRN: [id number] Date of Birth: [birth year]  Date of referral:  01/27/17               Reason for consult:  Discharge Planning, Facility Placement                Permission sought to share information with:  Facility Sport and exercise psychologist, Family Supports Permission granted to share information::  Yes, Verbal Permission Granted  Name::     [place]::  SNFs  Relationship::  spouse  Contact Information:  [phone]  Housing/Transportation Living arrangements for the past 2 months:  Single Family Home Source of Information:  Patient Patient Interpreter Needed:  None Criminal Activity/Legal Involvement Pertinent to Current Situation/Hospitalization:  No - Comment as needed Significant Relationships:  Spouse Lives with:  Spouse Do you feel safe going back to the place where you live?  Yes Need for family participation in patient care:  No (Coment)  Care giving concerns: Patient from home with wife. PT recommending SNF.   Social Worker assessment / plan: CSW met with patient at bedside. Patient agreeable to SNF and prefers facilities in [facility] if possible. CSW sent out initial referrals. CSW to follow and give bed offers.  Employment status:  Retired Forensic scientist:  [employer]) PT Recommendations:  [place] / Referral to community resources:  [place]  Patient/Family's Response to care: Patient appreciative of care.  Patient/Family's Understanding of and Emotional Response to Diagnosis, Current Treatment, and Prognosis: Patient agreeable to rehab at SNF.  Emotional Assessment Appearance:  Appears stated age Attitude/Demeanor/Rapport:  Other (appropriate) Affect (typically observed):  Appropriate Orientation:  Oriented to Self, Oriented to Situation, Oriented to Place, Oriented to  Time Alcohol / Substance use:  Not  Applicable Psych involvement (Current and /or in the community):  No (Comment)  Discharge Needs  Concerns to be addressed:  Discharge Planning Concerns Readmission within the last 30 days:  No Current discharge risk:  Physical Impairment Barriers to Discharge:  Continued Medical Work up   Estanislado Emms, LCSW 01/27/2017, 9:33 AM

## 2017-01-27 NOTE — [place] FL2 (Signed)
[place] MEDICAID FL2 LEVEL OF CARE SCREENING TOOL     IDENTIFICATION  Patient Name: Patrick Knox Birthdate: [birth year] Sex: male Admission Date (Current Location): 01/21/2017  [facility] and [place] Number:  Producer, television/film/video and Address:  [facility], [address], [place], [place] [address]      Provider Number: [id number]  Attending Physician Name and Address:  Penny Pia, MD  Relative Name and Phone Number:       Current Level of Care: Hospital Recommended Level of Care: Skilled Nursing Facility Prior Approval Number:    Date Approved/Denied:   PASRR Number: [id number]  Discharge Plan: SNF    Current Diagnoses: Patient Active Problem List   Diagnosis Date Noted  . Acute renal failure superimposed on stage 3 chronic kidney disease (HCC) 01/21/2017  . Chronic systolic CHF (congestive heart failure) (HCC) 01/21/2017  . Hypotension 01/21/2017  . Sepsis (HCC) 01/21/2017  . Acute metabolic encephalopathy 01/21/2017  . Hyponatremia 01/21/2017  . Elevated troponin 01/21/2017  . Tobacco abuse 01/21/2017  . Encounter for therapeutic drug monitoring 06/21/2016  . Paroxysmal atrial fibrillation (HCC) 06/08/2016  . Cardiomyopathy, ischemic   . Bradycardia 02/17/2016  . 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 Heart block   . SBO (small bowel obstruction) (HCC) 12/20/2015  . Hypertension   . High cholesterol     Orientation RESPIRATION BLADDER Height & Weight     Self, Time, Situation, Place  Normal External catheter, Incontinent Weight: 150 lb 8 oz (68.3 kg) Height:   (185.4 cm)  BEHAVIORAL SYMPTOMS/MOOD NEUROLOGICAL BOWEL NUTRITION STATUS        Diet (please see DC summary)  AMBULATORY STATUS COMMUNICATION OF NEEDS Skin   Extensive Assist Verbally Surgical wounds (incision L chest, closed, gauze dressing)                       Personal Care Assistance Level of Assistance  Bathing, Feeding, Dressing Bathing Assistance:  Limited assistance Feeding assistance: Independent Dressing Assistance: Limited assistance     Functional Limitations Info  Sight, Hearing, Speech Sight Info: Adequate Hearing Info: Adequate Speech Info: Adequate    SPECIAL CARE FACTORS FREQUENCY  PT (By licensed PT), OT (By licensed OT)     PT Frequency: 5x/week OT Frequency: 5x/week            Contractures Contractures Info: Not present    Additional Factors Info  Code Status, Allergies Code Status Info: Full Allergies Info: No Known Allergies           Current Medications (01/27/2017):  This is the current hospital active medication list Current Facility-Administered Medications  Medication Dose Route Frequency Provider Last Rate Last Dose  . 0.9 %  sodium chloride infusion  250 mL Intravenous PRN Allred, Fayrene Fearing, MD      . acetaminophen (TYLENOL) suppository 650 mg  650 mg Rectal Q6H PRN Lorretta Harp, MD      . acetaminophen (TYLENOL) tablet 325-650 mg  325-650 mg Oral Q4H PRN Allred, Fayrene Fearing, MD      . carvedilol (COREG) tablet 3.125 mg  3.125 mg Oral BID Calvert Cantor, MD   3.125 mg at 01/26/17 0927  . ceFAZolin (ANCEF) IVPB 2g/100 mL premix  2 g Intravenous Q8H Calvert Cantor, MD   Stopped at 01/27/17 0500  . HYDROcodone-acetaminophen (NORCO/VICODIN) 5-325 MG per tablet 1-2 tablet  1-2 tablet Oral Q4H PRN Allred, James, MD      . mupirocin ointment (BACTROBAN) 2 %  1 application  1 application Nasal BID Penny Pia, MD   1 application at 01/27/17 [phone]  . ondansetron (ZOFRAN) injection 4 mg  4 mg Intravenous Q6H PRN Allred, Fayrene Fearing, MD      . senna-docusate (Senokot-S) tablet 1 tablet  1 tablet Oral BID Dorothea Ogle, MD   1 tablet at 01/26/17 [phone]  . sodium chloride flush (NS) 0.9 % injection 3 mL  3 mL Intravenous Q12H Allred, Fayrene Fearing, MD   3 mL at 01/26/17 1000  . sodium chloride flush (NS) 0.9 % injection 3 mL  3 mL Intravenous PRN Hillis Range, MD         Discharge Medications: Please see discharge summary for a  list of discharge medications.  Relevant Imaging Results:  Relevant Lab Results:   Additional Information SSN: [id number]  [name], LCSW

## 2017-01-27 NOTE — Progress Notes (Signed)
PROGRESS NOTE    Patrick Knox   ZOX:[id number]  DOB: [birth year]  DOA: 01/21/2017 PCP: Kirby Funk, MD   Brief Narrative:  Patrick Knox 76 y.o. male with PPM, history of CAD with CABG, HTN, sCHF, A-fib on Coumadin, complete heart block with pacemaker, CKD 3, tobacco abuse presents from home with confusion and weakness. Apparently also started Flexeril recently. Prior to coming in to the hospital, per wife, he was very active and volunteers with the church etc.  Found to have BP 86/46, sodium 127, exposed pacer wires and and pus coming out of pacemaker pocket. Blood cultures 2/2 for MSSA.   Subjective: Pt has no new complaints. No acute issues overnight.   Assessment & Plan:   Principal Problem:   Sepsis, MSSA bacteremia and in UA (likely due to hematogenic spread) -  pacemaker infection with noted ulcer/ pus at site -  fevers / encephalopathic - repeat blood cultures still + likely he still has a pacer   - pacer was infected and removed. Patient has temporary pacer - TTE  Today > no endocarditis but has a mobile density on a pacer lead - Will continue ancef  - ID managing antibiotic regimen, last blood culture pending but still negative.  Active Problems:    Acute metabolic encephalopathy - resolving.    Paroxysmal atrial fibrillation (HCC) - Continue coreg. - Heparin for now due to procedures- resume Coumadin per cardiology    Hypotension with h/o Hypertension - Coreg- lower than home dose for now- holding Lisinopril    Hyponatremia, Acute renal failure superimposed on stage 3 chronic kidney disease (HCC) - baseline Cr 1.1 - admitted with Cr 3.4- likely prerenal and ATN in setting of ACE I and sepsis - steadily improving - d/c IVF 9/23- appeared to be eating/drinking well and had good urine output- cont to follow - Cr improving- will reassess serum creatinine.  Metabolic acidosis - improving- likely from AKI and sepsis    Chronic systolic CHF (congestive heart  failure) (HCC) - ECHO this admission: EF 35-40% with focal hypokinesis, grade 1 dCHF - not on chronic diuretics    Hypotension/  -Has resolved   Hyponatremia - mild. Will most likely correct with improvement in oral intake. - reassess next am. BMP order placed.  Mildly elevated troponin - likely due to sepsis/ hypotension, no chest pain reported.    Tobacco abuse - counseling - states he quit smoking about 2 wks prior to admission- d/c patch  DVT prophylaxis: Heparin Code Status: Full code Family Communication: none at bedside Disposition Plan: pending final recommendations from ID  Consultants:   Cardiology  ID  Procedures:  2 D ECHO Study Conclusions  - Left ventricle: The cavity size was mildly dilated. Systolic   function was moderately reduced. The estimated ejection fraction   was in the range of 35% to 40%. Diffuse hypokinesis. Dyskinesis   of the basal inferolateral myocardium. Akinesis of the basal   inferior myocardium. Doppler parameters are consistent with   abnormal left ventricular relaxation (grade 1 diastolic   dysfunction). - Aortic valve: Transvalvular velocity was within the normal range.   There was no stenosis. There was no regurgitation. - Mitral valve: Transvalvular velocity was within the normal range.   There was no evidence for stenosis. There was trivial   regurgitation. - Right ventricle: The cavity size was normal. Wall thickness was   normal. Systolic function was mildly reduced. - Tricuspid valve: There was mild regurgitation. - Pulmonary arteries: Systolic pressure was  within the normal   range. PA peak pressure: 27 mm Hg (S).  TEE small mobile echodensity on one of 3 pacer leads at the junction of SVC/RA that may represent small mobile thrombus or vegetation. No other obvious lesions noted on pacer wires.    - EF 35-40%  - Mild MR  - Mild TR, myomatous/thickened tricuspid valve. No obvious vegetations.   - No obvious valvular  vegetations  Antimicrobials:  Anti-infectives    Start     Dose/Rate Route Frequency Ordered Stop   01/25/17 1525  gentamicin (GARAMYCIN) 80 mg in sodium chloride irrigation 0.9 % 500 mL irrigation  Status:  Discontinued       As needed 01/25/17 1526 01/25/17 1614   01/25/17 1300  gentamicin (GARAMYCIN) 80 mg in sodium chloride irrigation 0.9 % 500 mL irrigation     80 mg Irrigation To Cath Lab 01/25/17 1240 01/25/17 1525   01/25/17 1300  ceFAZolin (ANCEF) IVPB 2g/100 mL premix     2 g 200 mL/hr over 30 Minutes Intravenous To Cath Lab 01/25/17 1240 01/25/17 1421   01/24/17 1030  gentamicin (GARAMYCIN) 80 mg in sodium chloride irrigation 0.9 % 500 mL irrigation  Status:  Discontinued     80 mg Irrigation To Cath Lab 01/24/17 0944 01/24/17 1451   01/24/17 0945  ceFAZolin (ANCEF) IVPB 2g/100 mL premix  Status:  Discontinued     2 g 200 mL/hr over 30 Minutes Intravenous On call 01/24/17 0944 01/24/17 1451   01/23/17 1600  ceFAZolin (ANCEF) IVPB 2g/100 mL premix     2 g 200 mL/hr over 30 Minutes Intravenous Every 8 hours 01/23/17 1331     01/22/17 1100  ceFAZolin (ANCEF) IVPB 2g/100 mL premix  Status:  Discontinued     2 g 200 mL/hr over 30 Minutes Intravenous Every 12 hours 01/22/17 1046 01/23/17 1331   01/22/17 0500  vancomycin (VANCOCIN) IVPB 750 mg/150 ml premix  Status:  Discontinued     750 mg 150 mL/hr over 60 Minutes Intravenous Every 24 hours 01/21/17 1107 01/22/17 1030   01/22/17 0000  piperacillin-tazobactam (ZOSYN) IVPB 2.25 g  Status:  Discontinued     2.25 g 100 mL/hr over 30 Minutes Intravenous Every 8 hours 01/21/17 2305 01/22/17 1030   01/21/17 1115  vancomycin (VANCOCIN) IVPB 1000 mg/200 mL premix     1,000 mg 200 mL/hr over 60 Minutes Intravenous  Once 01/21/17 1104 01/21/17 1257   01/21/17 1100  piperacillin-tazobactam (ZOSYN) IVPB 3.375 g     3.375 g 100 mL/hr over 30 Minutes Intravenous  Once 01/21/17 1051 01/21/17 1150       Objective: Vitals:   01/26/17  1556 01/26/17 2022 01/27/17 0436 01/27/17 0728  BP: 116/71 118/73 118/75 128/69  Pulse: 60 60 (!) 59 (!) 58  Resp: (!) Temp: 98.6 F (37 C) 98.5 F (36.9 C) 98.5 F (36.9 C) 98 F (36.7 C)  TempSrc: Oral Oral Oral Oral  SpO2: 98% 100% 100% 100%  Weight:   68.3 kg (150 lb 8 oz)   Height:        Intake/Output Summary (Last 24 hours) at 01/27/17 1340 Last data filed at 01/27/17 0900  Gross per 24 hour  Intake              580 ml  Output              800 ml  Net             -  220 ml   Filed Weights   01/25/17 0855 01/26/17 0422 01/27/17 0436  Weight: 68 kg (150 lb) 67.1 kg (147 lb 14.4 oz) 68.3 kg (150 lb 8 oz)    Examination: General exam: Appears comfortable in nad. HEENT: PERRLA, oral mucosa moist, no sclera icterus or thrush Respiratory system: Clear to auscultation. Respiratory effort normal. Equal chest rise. Cardiovascular system: S1 & S2 heard, RRR.  No rubs or murmurs Gastrointestinal system: Abdomen soft, non-tender, nondistended. Normal bowel sound. No organomegaly Central nervous system:   No focal neurological deficits. Extremities: No cyanosis, clubbing or edema Skin:   ulcer in skin above pacemaker Psychiatry:  Difficult to assess as sleepy    Data Reviewed: I have personally reviewed following labs and imaging studies  CBC:  Recent Labs Lab 01/21/17 1030 01/22/17 0509 01/23/17 0324 01/24/17 0605 01/25/17 0213 01/26/17 0535  WBC 11.4* 11.6* 11.7* 15.1* 15.0* 14.1*  NEUTROABS 9.8*  --   --   --   --   --   HGB 14.3 13.8 13.6 13.3 12.9* 11.9*  HCT 41.0 39.2 39.7 37.9* 36.5* 36.3*  MCV 83.5 82.7 83.4 81.0 81.5 84.2  PLT 193 161 183 160 209 340   Basic Metabolic Panel:  Recent Labs Lab 01/22/17 0509 01/23/17 0324 01/24/17 0605 01/26/17 0535 01/27/17 0543  NA 132* 136 133* 139 134*  K 3.7 3.8 4.1 4.7 4.8  CL 107 107 109 109 106  CO2 14* 21* 16* 20* 21*  GLUCOSE 135* 142* 149* 135* 128*  BUN 77* 68* 61* 53* 43*  CREATININE  2.60* 2.18* 1.88* 1.62* 1.41*  CALCIUM 8.7* 8.8* 8.4* 8.6* 8.4*   GFR: Estimated Creatinine Clearance: 43.7 mL/min (A) (by C-G formula based on SCr of 1.41 mg/dL (H)). Liver Function Tests:  Recent Labs Lab 01/21/17 1030 01/24/17 0605  AST 173* 78*  ALT 83* 29  ALKPHOS 54 47  BILITOT 1.5* 0.9  PROT 8.1 5.8*  ALBUMIN 3.3* 2.0*   No results for input(s): LIPASE, AMYLASE in the last 168 hours. No results for input(s): AMMONIA in the last 168 hours. Coagulation Profile:  Recent Labs Lab 01/23/17 0324 01/24/17 0605 01/25/17 1249 01/26/17 0535 01/27/17 1020  INR 2.01 2.02 2.41 3.10 3.03   Cardiac Enzymes:  Recent Labs Lab 01/21/17 1030 01/21/17 1530 01/21/17 2329 01/22/17 0509 01/22/17 0925  TROPONINI 0.18* 0.18* 0.17* 0.15* 0.13*   BNP (last 3 results) No results for input(s): PROBNP in the last 8760 hours. HbA1C: No results for input(s): HGBA1C in the last 72 hours. CBG:  Recent Labs Lab 01/23/17 0738 01/24/17 0737 01/25/17 0803 01/26/17 0746 01/27/17 0727  GLUCAP 126* 156* 133* 169* 181*   Lipid Profile: No results for input(s): CHOL, HDL, LDLCALC, TRIG, CHOLHDL, LDLDIRECT in the last 72 hours. Thyroid Function Tests: No results for input(s): TSH, T4TOTAL, FREET4, T3FREE, THYROIDAB in the last 72 hours. Anemia Panel: No results for input(s): VITAMINB12, FOLATE, FERRITIN, TIBC, IRON, RETICCTPCT in the last 72 hours. Urine analysis:    Component Value Date/Time   COLORURINE YELLOW 01/21/2017 1215   APPEARANCEUR CLOUDY (A) 01/21/2017 1215   LABSPEC 1.025 01/21/2017 1215   PHURINE 5.5 01/21/2017 1215   GLUCOSEU NEGATIVE 01/21/2017 1215   HGBUR LARGE (A) 01/21/2017 1215   BILIRUBINUR NEGATIVE 01/21/2017 1215   KETONESUR NEGATIVE 01/21/2017 1215   PROTEINUR >300 (A) 01/21/2017 1215   NITRITE NEGATIVE 01/21/2017 1215   LEUKOCYTESUR NEGATIVE 01/21/2017 1215   Sepsis Labs: (procalcitonin:4,lacticidven:4) ) Recent Results (from the past  240 hour(s))  Culture, blood (routine x 2)     Status: Abnormal   Collection Time: 01/21/17 10:50 AM  Result Value Ref Range Status   Specimen Description BLOOD LEFT FOREARM  Final   Special Requests   Final    BOTTLES DRAWN AEROBIC AND ANAEROBIC Blood Culture adequate volume   Culture  Setup Time   Final    GRAM POSITIVE COCCI IN CLUSTERS IN BOTH AEROBIC AND ANAEROBIC BOTTLES CRITICAL VALUE NOTED.  VALUE IS CONSISTENT WITH PREVIOUSLY REPORTED AND CALLED VALUE.    Culture (A)  Final    STAPHYLOCOCCUS AUREUS SUSCEPTIBILITIES PERFORMED ON PREVIOUS CULTURE WITHIN THE LAST 5 DAYS. Performed at [facility], [address] [place], [place] [address]    Report Status 01/24/2017 FINAL  Final  Culture, blood (routine x 2)     Status: Abnormal   Collection Time: 01/21/17 11:08 AM  Result Value Ref Range Status   Specimen Description BLOOD LEFT WRIST  Final   Special Requests   Final    BOTTLES DRAWN AEROBIC AND ANAEROBIC Blood Culture adequate volume   Culture  Setup Time   Final    GRAM POSITIVE COCCI IN CLUSTERS IN BOTH AEROBIC AND ANAEROBIC BOTTLES CRITICAL RESULT CALLED TO, READ BACK BY AND VERIFIED WITH: K.COOK PHARMD 01/22/17 0408 L.CHAMPION Performed at [facility], [address], [place], [place] [address]    Culture STAPHYLOCOCCUS AUREUS (A)  Final   Report Status 01/24/2017 FINAL  Final   Organism ID, Bacteria STAPHYLOCOCCUS AUREUS  Final      Susceptibility   Staphylococcus aureus - MIC*    CIPROFLOXACIN <=0.5 SENSITIVE Sensitive     ERYTHROMYCIN <=0.25 SENSITIVE Sensitive     GENTAMICIN <=0.5 SENSITIVE Sensitive     OXACILLIN 0.5 SENSITIVE Sensitive     TETRACYCLINE <=1 SENSITIVE Sensitive     VANCOMYCIN <=0.5 SENSITIVE Sensitive     TRIMETH/SULFA <=10 SENSITIVE Sensitive     CLINDAMYCIN <=0.25 SENSITIVE Sensitive     RIFAMPIN <=0.5 SENSITIVE Sensitive     Inducible Clindamycin NEGATIVE Sensitive     * STAPHYLOCOCCUS AUREUS  Blood Culture ID Panel  (Reflexed)     Status: Abnormal   Collection Time: 01/21/17 11:08 AM  Result Value Ref Range Status   Enterococcus species NOT DETECTED NOT DETECTED Final   Vancomycin resistance NOT DETECTED NOT DETECTED Final   Listeria monocytogenes NOT DETECTED NOT DETECTED Final   Staphylococcus species DETECTED (A) NOT DETECTED Final    Comment: CRITICAL RESULT CALLED TO, READ BACK BY AND VERIFIED WITH: K.COOK PHARMD 01/22/17 0408 L.CHAMPION    Staphylococcus aureus DETECTED (A) NOT DETECTED Final    Comment: CRITICAL RESULT CALLED TO, READ BACK BY AND VERIFIED WITH: K.COOK PHARMD 01/22/17 0408 L.CHAMPION    Methicillin resistance NOT DETECTED NOT DETECTED Final   Streptococcus species NOT DETECTED NOT DETECTED Final   Streptococcus agalactiae NOT DETECTED NOT DETECTED Final   Streptococcus pneumoniae NOT DETECTED NOT DETECTED Final   Streptococcus pyogenes NOT DETECTED NOT DETECTED Final   Acinetobacter baumannii NOT DETECTED NOT DETECTED Final   Enterobacteriaceae species NOT DETECTED NOT DETECTED Final   Enterobacter cloacae complex NOT DETECTED NOT DETECTED Final   Escherichia coli NOT DETECTED NOT DETECTED Final   Klebsiella oxytoca NOT DETECTED NOT DETECTED Final   Klebsiella pneumoniae NOT DETECTED NOT DETECTED Final   Proteus species NOT DETECTED NOT DETECTED Final   Serratia marcescens NOT DETECTED NOT DETECTED Final   Carbapenem resistance NOT DETECTED NOT DETECTED Final  Haemophilus influenzae NOT DETECTED NOT DETECTED Final   Neisseria meningitidis NOT DETECTED NOT DETECTED Final   Pseudomonas aeruginosa NOT DETECTED NOT DETECTED Final   Candida albicans NOT DETECTED NOT DETECTED Final   Candida glabrata NOT DETECTED NOT DETECTED Final   Candida krusei NOT DETECTED NOT DETECTED Final   Candida parapsilosis NOT DETECTED NOT DETECTED Final   Candida tropicalis NOT DETECTED NOT DETECTED Final    Comment: Performed at [facility], [address], [place], [place] [address]    Urine culture     Status: Abnormal   Collection Time: 01/21/17 12:15 PM  Result Value Ref Range Status   Specimen Description URINE, CLEAN CATCH  Final   Special Requests NONE  Final   Culture 70,000 COLONIES/mL STAPHYLOCOCCUS AUREUS (A)  Final   Report Status 01/23/2017 FINAL  Final   Organism ID, Bacteria STAPHYLOCOCCUS AUREUS (A)  Final      Susceptibility   Staphylococcus aureus - MIC*    CIPROFLOXACIN <=0.5 SENSITIVE Sensitive     GENTAMICIN <=0.5 SENSITIVE Sensitive     NITROFURANTOIN 32 SENSITIVE Sensitive     OXACILLIN <=0.25 SENSITIVE Sensitive     TETRACYCLINE <=1 SENSITIVE Sensitive     VANCOMYCIN <=0.5 SENSITIVE Sensitive     TRIMETH/SULFA <=10 SENSITIVE Sensitive     CLINDAMYCIN <=0.25 SENSITIVE Sensitive     RIFAMPIN <=0.5 SENSITIVE Sensitive     Inducible Clindamycin NEGATIVE Sensitive     * 70,000 COLONIES/mL STAPHYLOCOCCUS AUREUS  Surgical pcr screen     Status: Abnormal   Collection Time: 01/24/17  1:35 AM  Result Value Ref Range Status   MRSA, PCR NEGATIVE NEGATIVE Final   Staphylococcus aureus POSITIVE (A) NEGATIVE Final    Comment: (NOTE) The Xpert SA Assay (FDA approved for NASAL specimens in patients 52 years of age and older), is one component of a comprehensive surveillance program. It is not intended to diagnose infection nor to guide or monitor treatment.   Culture, blood (Routine X 2) w Reflex to ID Panel     Status: Abnormal   Collection Time: 01/24/17  9:45 AM  Result Value Ref Range Status   Specimen Description BLOOD RIGHT HAND  Final   Special Requests IN PEDIATRIC BOTTLE Blood Culture adequate volume  Final   Culture  Setup Time   Final    GRAM POSITIVE COCCI IN CLUSTERS IN PEDIATRIC BOTTLE CRITICAL RESULT CALLED TO, READ BACK BY AND VERIFIED WITH: L. BAJBUS PHARMD, AT 0748 01/25/17 BY D. VANHOOK    Culture STAPHYLOCOCCUS AUREUS (A)  Final   Report Status 01/27/2017 FINAL  Final   Organism ID, Bacteria STAPHYLOCOCCUS AUREUS  Final       Susceptibility   Staphylococcus aureus - MIC*    CIPROFLOXACIN <=0.5 SENSITIVE Sensitive     ERYTHROMYCIN <=0.25 SENSITIVE Sensitive     GENTAMICIN <=0.5 SENSITIVE Sensitive     OXACILLIN 0.5 SENSITIVE Sensitive     TETRACYCLINE <=1 SENSITIVE Sensitive     VANCOMYCIN <=0.5 SENSITIVE Sensitive     TRIMETH/SULFA <=10 SENSITIVE Sensitive     CLINDAMYCIN <=0.25 SENSITIVE Sensitive     RIFAMPIN <=0.5 SENSITIVE Sensitive     Inducible Clindamycin NEGATIVE Sensitive     * STAPHYLOCOCCUS AUREUS  Blood Culture ID Panel (Reflexed)     Status: Abnormal   Collection Time: 01/24/17  9:45 AM  Result Value Ref Range Status   Enterococcus species NOT DETECTED NOT DETECTED Final   Listeria monocytogenes NOT DETECTED NOT DETECTED Final  Staphylococcus species DETECTED (A) NOT DETECTED Final    Comment: CRITICAL RESULT CALLED TO, READ BACK BY AND VERIFIED WITH: L. BAJBUS PHARMD, AT 1610 01/25/17 BY D. VANHOOK    Staphylococcus aureus DETECTED (A) NOT DETECTED Final    Comment: Methicillin (oxacillin) susceptible Staphylococcus aureus (MSSA). Preferred therapy is anti staphylococcal beta lactam antibiotic (Cefazolin or Nafcillin), unless clinically contraindicated. CRITICAL RESULT CALLED TO, READ BACK BY AND VERIFIED WITH: L. BAJBUS PHARMD, AT 9604 01/25/17 BY D. VANHOOK    Methicillin resistance NOT DETECTED NOT DETECTED Final   Streptococcus species NOT DETECTED NOT DETECTED Final   Streptococcus agalactiae NOT DETECTED NOT DETECTED Final   Streptococcus pneumoniae NOT DETECTED NOT DETECTED Final   Streptococcus pyogenes NOT DETECTED NOT DETECTED Final   Acinetobacter baumannii NOT DETECTED NOT DETECTED Final   Enterobacteriaceae species NOT DETECTED NOT DETECTED Final   Enterobacter cloacae complex NOT DETECTED NOT DETECTED Final   Escherichia coli NOT DETECTED NOT DETECTED Final   Klebsiella oxytoca NOT DETECTED NOT DETECTED Final   Klebsiella pneumoniae NOT DETECTED NOT DETECTED Final   Proteus  species NOT DETECTED NOT DETECTED Final   Serratia marcescens NOT DETECTED NOT DETECTED Final   Haemophilus influenzae NOT DETECTED NOT DETECTED Final   Neisseria meningitidis NOT DETECTED NOT DETECTED Final   Pseudomonas aeruginosa NOT DETECTED NOT DETECTED Final   Candida albicans NOT DETECTED NOT DETECTED Final   Candida glabrata NOT DETECTED NOT DETECTED Final   Candida krusei NOT DETECTED NOT DETECTED Final   Candida parapsilosis NOT DETECTED NOT DETECTED Final   Candida tropicalis NOT DETECTED NOT DETECTED Final  Culture, blood (Routine X 2) w Reflex to ID Panel     Status: None (Preliminary result)   Collection Time: 01/26/17  3:01 PM  Result Value Ref Range Status   Specimen Description BLOOD RIGHT HAND  Final   Special Requests IN PEDIATRIC BOTTLE Blood Culture adequate volume  Final   Culture NO GROWTH < 24 HOURS  Final   Report Status PENDING  Incomplete         Radiology Studies: Dg Chest 2 View  Result Date: 01/26/2017 CLINICAL DATA:  Status post permanent pacemaker change out. EXAM: CHEST  2 VIEW COMPARISON:  Portable chest x-ray of January 21, 2017 FINDINGS: The previous pacemaker and electrodes have been removed. A single electrode has been placed with the tip in the right ventricular apex. The generator lies above the left clavicle. The lungs are adequately inflated. There is no postprocedure pneumothorax. There is stable scarring at the left lung base. The heart is top-normal in size. The pulmonary vascularity is normal. There is calcification in the wall of the aortic arch. The patient has undergone previous CABG. IMPRESSION: No postprocedure complication following permanent pacemaker change out. Previous CABG.  Thoracic aortic atherosclerosis. Electronically Signed   By: David  [place] M.D.   On: 01/26/2017 07:25      Scheduled Meds: . carvedilol  3.125 mg Oral BID  . mupirocin ointment  1 application Nasal BID  . senna-docusate  1 tablet Oral BID  . sodium  chloride flush  3 mL Intravenous Q12H   Continuous Infusions: . sodium chloride    .  ceFAZolin (ANCEF) IV Stopped (01/27/17 0500)     LOS: 6 days    Time spent in minutes: 35 min    Penny Pia, MD [facility] Pager: [email or web address] Password [email or web address] 01/27/2017, 1:40 PM

## 2017-01-28 DIAGNOSIS — Z959 Presence of cardiac and vascular implant and graft, unspecified: Secondary | ICD-10-CM

## 2017-01-28 DIAGNOSIS — A408 Other streptococcal sepsis: Secondary | ICD-10-CM

## 2017-01-28 LAB — BASIC METABOLIC PANEL
ANION GAP: 8 (ref 5–15)
BUN: 38 mg/dL — ABNORMAL HIGH (ref 6–20)
CHLORIDE: 104 mmol/L (ref 101–111)
CO2: 22 mmol/L (ref 22–32)
CREATININE: 1.25 mg/dL — AB (ref 0.61–1.24)
Calcium: 9.1 mg/dL (ref 8.9–10.3)
GFR calc non Af Amer: 55 mL/min — ABNORMAL LOW (ref >60)
Glucose, Bld: 116 mg/dL — ABNORMAL HIGH (ref 65–99)
POTASSIUM: 5.2 mmol/L — AB (ref 3.5–5.1)
SODIUM: 134 mmol/L — AB (ref 135–145)

## 2017-01-28 LAB — PROTIME-INR
INR: 1.61
Prothrombin Time: 19 seconds — ABNORMAL HIGH (ref 11.4–15.2)

## 2017-01-28 LAB — GLUCOSE, CAPILLARY: GLUCOSE-CAPILLARY: 135 mg/dL — AB (ref 65–99)

## 2017-01-28 NOTE — Care Management Note (Addendum)
Case Management Note  Patient Details  Name: Patrick Knox MRN: [id number] Date of Birth: 08-Aug-1940  Subjective/Objective:  Pt presented as a Transfer from [facility] for Sepsis- Pacemaker Endocarditis. Pacemaker Extracted 01-25-17. Plan will be for 6 weeks of IV antibiotics/MSSA Bacteremia. PT/ OT recommendations for SNF.                   Action/Plan: CSW is following the patient. CM will continue to monitor for additional needs.   Expected Discharge Date:                  Expected Discharge Plan:  Skilled Nursing Facility  In-House Referral:  Clinical Social Work  Discharge planning Services  CM Consult  Post Acute Care Choice:  NA Choice offered to:  NA  DME Arranged:  N/A DME Agency:  NA  HH Arranged:  NA HH Agency:  NA  Status of Service:  Completed, signed off  If discussed at [employer] of Stay Meetings, dates discussed:  01-27-17, 02-01-17, 02-03-17  Additional Comments: 02-03-17 1647 Tomi Bamberger, RN,BSN [phone] CM did speak with CSW- [name] will be able to take pt's with temporary pacemaker. CM will continue to monitor.     02-03-17 Tomi Bamberger, RN,BSN [phone] Pt with a temporary pacer- plan 2 weeks before placing permanent pacemaker. CM did reach out to EP to see what the plan is in regards to disposition needs. CSW has been looking at a SNF to see if any willing to take patient with external pacemaker. CM awaiting call back from Cardiology.  Gala Lewandowsky, RN 01/28/2017, 3:30 PM

## 2017-01-28 NOTE — Progress Notes (Signed)
CSW gave patient SNF bed offers (placed in top drawer of bedside table at patient's request). CSW called patient's wife to go over offers with her but it was a non-working number. CSW to follow and obtain patient bed choice.  [name], LCSWA [phone]

## 2017-01-28 NOTE — Progress Notes (Signed)
Occupational Therapy Treatment Patient Details Name: Patrick Knox MRN: [id number] DOB: August 06, 1940 Today's Date: 01/28/2017    History of present illness Pt is 76 yo male with known HTN, HL, sCHF, EF 35%, a-fib on Coumadin, complete heart block and s/p pacemaker, CAD, s/p CABG, tobacco use, CKD stage III, presented with lethargy and weakness, poor oral intake, progressively worsening confusion. 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 Session this day due to pt. Reports of "feeling bad all over and being very tired".  Grooming activities and self feeding bed level.  Pt. In/out of sleep during session.  Requests we try more another day.  Will continue to follow acutely.    Follow Up Recommendations  SNF;Supervision/Assistance - 24 hour    Equipment Recommendations       Recommendations for Other Services      Precautions / Restrictions Precautions Precautions: Fall Precaution Comments: telemetry       Mobility Bed Mobility               General bed mobility comments: pt. declined all bed mobility  Transfers                 General transfer comment: declined transfers    Balance                                           ADL either performed or assessed with clinical judgement   ADL Overall ADL's : Needs assistance/impaired Eating/Feeding: Supervision/ safety;Set up;Bed level   Grooming: Wash/dry face;Bed level;Moderate assistance                                 General ADL Comments: max encouragement for participation.  pt. states he feels "bad all over, lets just not do this today". agreeable to light grooming task of cleaning glasses and washing face.  also beverage consumption x 3.  pt. declined all out of bed activities     Vision       Perception     Praxis      Cognition Arousal/Alertness: Lethargic Behavior During Therapy: Flat affect                                             Exercises     Shoulder Instructions       General Comments      Pertinent Vitals/ Pain       Pain Assessment:  (did not rate, states "feels bad all over")  Home Living                                          Prior Functioning/Environment              Frequency  Min 2X/week        Progress Toward Goals  OT Goals(current goals can now be found in the care plan section)  Progress towards OT goals: Progressing toward goals     Plan Discharge plan remains appropriate    Co-evaluation  AM-PAC PT "6 Clicks" Daily Activity     Outcome Measure   Help from another person eating meals?: A Little Help from another person taking care of personal grooming?: A Lot Help from another person toileting, which includes using toliet, bedpan, or urinal?: Total Help from another person bathing (including washing, rinsing, drying)?: A Lot Help from another person to put on and taking off regular upper body clothing?: A Lot Help from another person to put on and taking off regular lower body clothing?: Total 6 Click Score: 11    End of Session    OT Visit Diagnosis: Unsteadiness on feet (R26.81);Other abnormalities of gait and mobility (R26.89);Muscle weakness (generalized) (M62.81);Pain   Activity Tolerance Patient limited by fatigue;Patient limited by lethargy   Patient Left in bed;with call bell/phone within reach;with bed alarm set   Nurse Communication          Time: 1610-9604 OT Time Calculation (min): 8 min  Charges: OT General Charges $OT Visit: 1 Visit OT Treatments $Self Care/Home Management : 8-22 mins   Robet Leu, COTA/L 01/28/2017, 11:30 AM

## 2017-01-28 NOTE — Progress Notes (Signed)
PROGRESS NOTE    Judy Goodenow   ZOX:[id number]  DOB: 1940-08-26  DOA: 01/21/2017 PCP: Kirby Funk, MD   Brief Narrative:  [name] 76 y.o. male with PPM, history of CAD with CABG, HTN, sCHF, A-fib on Coumadin, complete heart block with pacemaker, CKD 3, tobacco abuse presents from home with confusion and weakness. Apparently also started Flexeril recently. Prior to coming in to the hospital, per wife, he was very active and volunteers with the church etc.  Found to have BP 86/46, sodium 127, exposed pacer wires and and pus coming out of pacemaker pocket. Blood cultures 2/2 for MSSA.   Subjective: Patient reports no new complaints. No new problems reported to me   Assessment & Plan:   Principal Problem:   Sepsis, MSSA bacteremia and in UA (likely due to hematogenic spread) -  pacemaker infection with noted ulcer/ pus at site -  fevers / encephalopathic - repeat blood cultures still + likely he still has a pacer   - pacer was infected and removed. Patient has temporary pacer - TTE  Today > no endocarditis but has a mobile density on a pacer lead - Antibiotics per ID recommendations - Last blood culture pending but still negative. If blood cultures remain negative plan can be to place permanent pacemaker.  Active Problems:    Acute metabolic encephalopathy - resolving.    Paroxysmal atrial fibrillation (HCC) - Continue coreg. - Heparin for now due to procedures- resume Coumadin per cardiology    Hypotension with h/o Hypertension - Coreg- lower than home dose for now- holding Lisinopril    Hyponatremia, Acute renal failure superimposed on stage 3 chronic kidney disease (HCC) - baseline Cr 1.1 - admitted with Cr 3.4- likely prerenal and ATN in setting of ACE I and sepsis - steadily improving - d/c IVF 9/23- appeared to be eating/drinking well and had good urine output- cont to follow - Cr improving- will reassess serum creatinine.  Metabolic acidosis - Resolving-  likely from AKI and sepsis    Chronic systolic CHF (congestive heart failure) (HCC) - ECHO this admission: EF 35-40% with focal hypokinesis, grade 1 dCHF - not on chronic diuretics    Hypotension/  Resolved  Hyponatremia - mild. Will most likely correct with improvement in oral intake.  Mildly elevated troponin - likely due to sepsis/ hypotension, no chest pain reported.    Tobacco abuse - counseling - states he quit smoking about 2 wks prior to admission- d/c patch  DVT prophylaxis: Heparin Code Status: Full code Family Communication: none at bedside Disposition Plan: pending final recommendations from ID. Once blood culture remain negative will recheck out to cardiology for pacemaker placement  Consultants:   Cardiology  ID  Procedures:  2 D ECHO Study Conclusions  - Left ventricle: The cavity size was mildly dilated. Systolic   function was moderately reduced. The estimated ejection fraction   was in the range of 35% to 40%. Diffuse hypokinesis. Dyskinesis   of the basal inferolateral myocardium. Akinesis of the basal   inferior myocardium. Doppler parameters are consistent with   abnormal left ventricular relaxation (grade 1 diastolic   dysfunction). - Aortic valve: Transvalvular velocity was within the normal range.   There was no stenosis. There was no regurgitation. - Mitral valve: Transvalvular velocity was within the normal range.   There was no evidence for stenosis. There was trivial   regurgitation. - Right ventricle: The cavity size was normal. Wall thickness was   normal. Systolic function was mildly  reduced. - Tricuspid valve: There was mild regurgitation. - Pulmonary arteries: Systolic pressure was within the normal   range. PA peak pressure: 27 mm Hg (S).  TEE small mobile echodensity on one of 3 pacer leads at the junction of SVC/RA that may represent small mobile thrombus or vegetation. No other obvious lesions noted on pacer wires.    - EF  35-40%  - Mild MR  - Mild TR, myomatous/thickened tricuspid valve. No obvious vegetations.   - No obvious valvular vegetations  Antimicrobials:  Anti-infectives    Start     Dose/Rate Route Frequency Ordered Stop   01/25/17 1525  gentamicin (GARAMYCIN) 80 mg in sodium chloride irrigation 0.9 % 500 mL irrigation  Status:  Discontinued       As needed 01/25/17 1526 01/25/17 1614   01/25/17 1300  gentamicin (GARAMYCIN) 80 mg in sodium chloride irrigation 0.9 % 500 mL irrigation     80 mg Irrigation To Cath Lab 01/25/17 1240 01/25/17 1525   01/25/17 1300  ceFAZolin (ANCEF) IVPB 2g/100 mL premix     2 g 200 mL/hr over 30 Minutes Intravenous To Cath Lab 01/25/17 1240 01/25/17 1421   01/24/17 1030  gentamicin (GARAMYCIN) 80 mg in sodium chloride irrigation 0.9 % 500 mL irrigation  Status:  Discontinued     80 mg Irrigation To Cath Lab 01/24/17 0944 01/24/17 1451   01/24/17 0945  ceFAZolin (ANCEF) IVPB 2g/100 mL premix  Status:  Discontinued     2 g 200 mL/hr over 30 Minutes Intravenous On call 01/24/17 0944 01/24/17 1451   01/23/17 1600  ceFAZolin (ANCEF) IVPB 2g/100 mL premix     2 g 200 mL/hr over 30 Minutes Intravenous Every 8 hours 01/23/17 1331     01/22/17 1100  ceFAZolin (ANCEF) IVPB 2g/100 mL premix  Status:  Discontinued     2 g 200 mL/hr over 30 Minutes Intravenous Every 12 hours 01/22/17 1046 01/23/17 1331   01/22/17 0500  vancomycin (VANCOCIN) IVPB 750 mg/150 ml premix  Status:  Discontinued     750 mg 150 mL/hr over 60 Minutes Intravenous Every 24 hours 01/21/17 1107 01/22/17 1030   01/22/17 0000  piperacillin-tazobactam (ZOSYN) IVPB 2.25 g  Status:  Discontinued     2.25 g 100 mL/hr over 30 Minutes Intravenous Every 8 hours 01/21/17 2305 01/22/17 1030   01/21/17 1115  vancomycin (VANCOCIN) IVPB 1000 mg/200 mL premix     1,000 mg 200 mL/hr over 60 Minutes Intravenous  Once 01/21/17 1104 01/21/17 1257   01/21/17 1100  piperacillin-tazobactam (ZOSYN) IVPB 3.375 g     3.375  g 100 mL/hr over 30 Minutes Intravenous  Once 01/21/17 1051 01/21/17 1150       Objective: Vitals:   01/27/17 1502 01/27/17 2023 01/28/17 0500 01/28/17 1404  BP: 124/71 121/67 114/72 120/67  Pulse: 60 (!) 59 61 (!) 59  Resp:  20 16 15   Temp: 98 F (36.7 C) 99.9 F (37.7 C) (!) 97.5 F (36.4 C) 98.2 F (36.8 C)  TempSrc: Oral Oral Oral Oral  SpO2: 100% 99% 99% 100%  Weight:   67.3 kg (148 lb 6.4 oz)   Height:        Intake/Output Summary (Last 24 hours) at 01/28/17 1832 Last data filed at 01/28/17 1800  Gross per 24 hour  Intake             1560 ml  Output             2000 ml  Net             -440 ml   Filed Weights   01/26/17 0422 01/27/17 0436 01/28/17 0500  Weight: 67.1 kg (147 lb 14.4 oz) 68.3 kg (150 lb 8 oz) 67.3 kg (148 lb 6.4 oz)    Examination:Exam unchanged compared to 01/27/2017 General exam: Appears comfortable in nad. HEENT: PERRLA, oral mucosa moist, no sclera icterus or thrush Respiratory system: Clear to auscultation. Respiratory effort normal. Equal chest rise. Cardiovascular system: S1 & S2 heard, RRR.  No rubs or murmurs Gastrointestinal system: Abdomen soft, non-tender, nondistended. Normal bowel sound. No organomegaly Central nervous system:   No focal neurological deficits. Extremities: No cyanosis, clubbing or edema Skin:   Patient has gauze over left upper chest Psychiatry:  Difficult to assess as sleepy    Data Reviewed: I have personally reviewed following labs and imaging studies  CBC:  Recent Labs Lab 01/22/17 0509 01/23/17 0324 01/24/17 0605 01/25/17 0213 01/26/17 0535  WBC 11.6* 11.7* 15.1* 15.0* 14.1*  HGB 13.8 13.6 13.3 12.9* 11.9*  HCT 39.2 39.7 37.9* 36.5* 36.3*  MCV 82.7 83.4 81.0 81.5 84.2  PLT 161 183 160 209 340   Basic Metabolic Panel:  Recent Labs Lab 01/23/17 0324 01/24/17 0605 01/26/17 0535 01/27/17 0543 01/28/17 0422  NA 136 133* 139 134* 134*  K 3.8 4.1 4.7 4.8 5.2*  CL 107 109 109 106 104  CO2 21*  16* 20* 21* 22  GLUCOSE 142* 149* 135* 128* 116*  BUN 68* 61* 53* 43* 38*  CREATININE 2.18* 1.88* 1.62* 1.41* 1.25*  CALCIUM 8.8* 8.4* 8.6* 8.4* 9.1   GFR: Estimated Creatinine Clearance: 48.6 mL/min (A) (by C-G formula based on SCr of 1.25 mg/dL (H)). Liver Function Tests:  Recent Labs Lab 01/24/17 0605  AST 78*  ALT 29  ALKPHOS 47  BILITOT 0.9  PROT 5.8*  ALBUMIN 2.0*   No results for input(s): LIPASE, AMYLASE in the last 168 hours. No results for input(s): AMMONIA in the last 168 hours. Coagulation Profile:  Recent Labs Lab 01/24/17 0605 01/25/17 1249 01/26/17 0535 01/27/17 1020 01/28/17 0422  INR 2.02 2.41 3.10 3.03 1.61   Cardiac Enzymes:  Recent Labs Lab 01/21/17 2329 01/22/17 0509 01/22/17 0925  TROPONINI 0.17* 0.15* 0.13*   BNP (last 3 results) No results for input(s): PROBNP in the last 8760 hours. HbA1C: No results for input(s): HGBA1C in the last 72 hours. CBG:  Recent Labs Lab 01/24/17 0737 01/25/17 0803 01/26/17 0746 01/27/17 0727 01/28/17 0733  GLUCAP 156* 133* 169* 181* 135*   Lipid Profile: No results for input(s): CHOL, HDL, LDLCALC, TRIG, CHOLHDL, LDLDIRECT in the last 72 hours. Thyroid Function Tests: No results for input(s): TSH, T4TOTAL, FREET4, T3FREE, THYROIDAB in the last 72 hours. Anemia Panel: No results for input(s): VITAMINB12, FOLATE, FERRITIN, TIBC, IRON, RETICCTPCT in the last 72 hours. Urine analysis:    Component Value Date/Time   COLORURINE YELLOW 01/21/2017 1215   APPEARANCEUR CLOUDY (A) 01/21/2017 1215   LABSPEC 1.025 01/21/2017 1215   PHURINE 5.5 01/21/2017 1215   GLUCOSEU NEGATIVE 01/21/2017 1215   HGBUR LARGE (A) 01/21/2017 1215   BILIRUBINUR NEGATIVE 01/21/2017 1215   KETONESUR NEGATIVE 01/21/2017 1215   PROTEINUR >300 (A) 01/21/2017 1215   NITRITE NEGATIVE 01/21/2017 1215   LEUKOCYTESUR NEGATIVE 01/21/2017 1215   Sepsis Labs: (procalcitonin:4,lacticidven:4) ) Recent Results (from the  past 240 hour(s))  Culture, blood (routine x 2)     Status: Abnormal   Collection Time: 01/21/17 10:50  AM  Result Value Ref Range Status   Specimen Description BLOOD LEFT FOREARM  Final   Special Requests   Final    BOTTLES DRAWN AEROBIC AND ANAEROBIC Blood Culture adequate volume   Culture  Setup Time   Final    GRAM POSITIVE COCCI IN CLUSTERS IN BOTH AEROBIC AND ANAEROBIC BOTTLES CRITICAL VALUE NOTED.  VALUE IS CONSISTENT WITH PREVIOUSLY REPORTED AND CALLED VALUE.    Culture (A)  Final    STAPHYLOCOCCUS AUREUS SUSCEPTIBILITIES PERFORMED ON PREVIOUS CULTURE WITHIN THE LAST 5 DAYS. Performed at [facility], [address], [place], [place] [address]    Report Status 01/24/2017 FINAL  Final  Culture, blood (routine x 2)     Status: Abnormal   Collection Time: 01/21/17 11:08 AM  Result Value Ref Range Status   Specimen Description BLOOD LEFT WRIST  Final   Special Requests   Final    BOTTLES DRAWN AEROBIC AND ANAEROBIC Blood Culture adequate volume   Culture  Setup Time   Final    GRAM POSITIVE COCCI IN CLUSTERS IN BOTH AEROBIC AND ANAEROBIC BOTTLES CRITICAL RESULT CALLED TO, READ BACK BY AND VERIFIED WITH: K.COOK PHARMD 01/22/17 0408 L.CHAMPION Performed at [facility], [address], [place], [place] [address]    Culture STAPHYLOCOCCUS AUREUS (A)  Final   Report Status 01/24/2017 FINAL  Final   Organism ID, Bacteria STAPHYLOCOCCUS AUREUS  Final      Susceptibility   Staphylococcus aureus - MIC*    CIPROFLOXACIN <=0.5 SENSITIVE Sensitive     ERYTHROMYCIN <=0.25 SENSITIVE Sensitive     GENTAMICIN <=0.5 SENSITIVE Sensitive     OXACILLIN 0.5 SENSITIVE Sensitive     TETRACYCLINE <=1 SENSITIVE Sensitive     VANCOMYCIN <=0.5 SENSITIVE Sensitive     TRIMETH/SULFA <=10 SENSITIVE Sensitive     CLINDAMYCIN <=0.25 SENSITIVE Sensitive     RIFAMPIN <=0.5 SENSITIVE Sensitive     Inducible Clindamycin NEGATIVE Sensitive     * STAPHYLOCOCCUS AUREUS  Blood Culture ID Panel  (Reflexed)     Status: Abnormal   Collection Time: 01/21/17 11:08 AM  Result Value Ref Range Status   Enterococcus species NOT DETECTED NOT DETECTED Final   Vancomycin resistance NOT DETECTED NOT DETECTED Final   Listeria monocytogenes NOT DETECTED NOT DETECTED Final   Staphylococcus species DETECTED (A) NOT DETECTED Final    Comment: CRITICAL RESULT CALLED TO, READ BACK BY AND VERIFIED WITH: K.COOK PHARMD 01/22/17 0408 L.CHAMPION    Staphylococcus aureus DETECTED (A) NOT DETECTED Final    Comment: CRITICAL RESULT CALLED TO, READ BACK BY AND VERIFIED WITH: K.COOK PHARMD 01/22/17 0408 L.CHAMPION    Methicillin resistance NOT DETECTED NOT DETECTED Final   Streptococcus species NOT DETECTED NOT DETECTED Final   Streptococcus agalactiae NOT DETECTED NOT DETECTED Final   Streptococcus pneumoniae NOT DETECTED NOT DETECTED Final   Streptococcus pyogenes NOT DETECTED NOT DETECTED Final   Acinetobacter baumannii NOT DETECTED NOT DETECTED Final   Enterobacteriaceae species NOT DETECTED NOT DETECTED Final   Enterobacter cloacae complex NOT DETECTED NOT DETECTED Final   Escherichia coli NOT DETECTED NOT DETECTED Final   Klebsiella oxytoca NOT DETECTED NOT DETECTED Final   Klebsiella pneumoniae NOT DETECTED NOT DETECTED Final   Proteus species NOT DETECTED NOT DETECTED Final   Serratia marcescens NOT DETECTED NOT DETECTED Final   Carbapenem resistance NOT DETECTED NOT DETECTED Final   Haemophilus influenzae NOT DETECTED NOT DETECTED Final   Neisseria meningitidis NOT DETECTED NOT DETECTED Final  Pseudomonas aeruginosa NOT DETECTED NOT DETECTED Final   Candida albicans NOT DETECTED NOT DETECTED Final   Candida glabrata NOT DETECTED NOT DETECTED Final   Candida krusei NOT DETECTED NOT DETECTED Final   Candida parapsilosis NOT DETECTED NOT DETECTED Final   Candida tropicalis NOT DETECTED NOT DETECTED Final    Comment: Performed at [facility], [address] [place], [place] [address]    Urine culture     Status: Abnormal   Collection Time: 01/21/17 12:15 PM  Result Value Ref Range Status   Specimen Description URINE, CLEAN CATCH  Final   Special Requests NONE  Final   Culture 70,000 COLONIES/mL STAPHYLOCOCCUS AUREUS (A)  Final   Report Status 01/23/2017 FINAL  Final   Organism ID, Bacteria STAPHYLOCOCCUS AUREUS (A)  Final      Susceptibility   Staphylococcus aureus - MIC*    CIPROFLOXACIN <=0.5 SENSITIVE Sensitive     GENTAMICIN <=0.5 SENSITIVE Sensitive     NITROFURANTOIN 32 SENSITIVE Sensitive     OXACILLIN <=0.25 SENSITIVE Sensitive     TETRACYCLINE <=1 SENSITIVE Sensitive     VANCOMYCIN <=0.5 SENSITIVE Sensitive     TRIMETH/SULFA <=10 SENSITIVE Sensitive     CLINDAMYCIN <=0.25 SENSITIVE Sensitive     RIFAMPIN <=0.5 SENSITIVE Sensitive     Inducible Clindamycin NEGATIVE Sensitive     * 70,000 COLONIES/mL STAPHYLOCOCCUS AUREUS  Surgical pcr screen     Status: Abnormal   Collection Time: 01/24/17  1:35 AM  Result Value Ref Range Status   MRSA, PCR NEGATIVE NEGATIVE Final   Staphylococcus aureus POSITIVE (A) NEGATIVE Final    Comment: (NOTE) The Xpert SA Assay (FDA approved for NASAL specimens in patients 54 years of age and older), is one component of a comprehensive surveillance program. It is not intended to diagnose infection nor to guide or monitor treatment.   Culture, blood (Routine X 2) w Reflex to ID Panel     Status: Abnormal   Collection Time: 01/24/17  9:45 AM  Result Value Ref Range Status   Specimen Description BLOOD RIGHT HAND  Final   Special Requests IN PEDIATRIC BOTTLE Blood Culture adequate volume  Final   Culture  Setup Time   Final    GRAM POSITIVE COCCI IN CLUSTERS IN PEDIATRIC BOTTLE CRITICAL RESULT CALLED TO, READ BACK BY AND VERIFIED WITH: L. BAJBUS PHARMD, AT 0748 01/25/17 BY D. VANHOOK    Culture STAPHYLOCOCCUS AUREUS (A)  Final   Report Status 01/27/2017 FINAL  Final   Organism ID, Bacteria STAPHYLOCOCCUS AUREUS  Final       Susceptibility   Staphylococcus aureus - MIC*    CIPROFLOXACIN <=0.5 SENSITIVE Sensitive     ERYTHROMYCIN <=0.25 SENSITIVE Sensitive     GENTAMICIN <=0.5 SENSITIVE Sensitive     OXACILLIN 0.5 SENSITIVE Sensitive     TETRACYCLINE <=1 SENSITIVE Sensitive     VANCOMYCIN <=0.5 SENSITIVE Sensitive     TRIMETH/SULFA <=10 SENSITIVE Sensitive     CLINDAMYCIN <=0.25 SENSITIVE Sensitive     RIFAMPIN <=0.5 SENSITIVE Sensitive     Inducible Clindamycin NEGATIVE Sensitive     * STAPHYLOCOCCUS AUREUS  Blood Culture ID Panel (Reflexed)     Status: Abnormal   Collection Time: 01/24/17  9:45 AM  Result Value Ref Range Status   Enterococcus species NOT DETECTED NOT DETECTED Final   Listeria monocytogenes NOT DETECTED NOT DETECTED Final   Staphylococcus species DETECTED (A) NOT DETECTED Final    Comment: CRITICAL RESULT CALLED TO, READ BACK  BY AND VERIFIED WITH: L. BAJBUS PHARMD, AT 4098 01/25/17 BY D. VANHOOK    Staphylococcus aureus DETECTED (A) NOT DETECTED Final    Comment: Methicillin (oxacillin) susceptible Staphylococcus aureus (MSSA). Preferred therapy is anti staphylococcal beta lactam antibiotic (Cefazolin or Nafcillin), unless clinically contraindicated. CRITICAL RESULT CALLED TO, READ BACK BY AND VERIFIED WITH: L. BAJBUS PHARMD, AT 1191 01/25/17 BY D. VANHOOK    Methicillin resistance NOT DETECTED NOT DETECTED Final   Streptococcus species NOT DETECTED NOT DETECTED Final   Streptococcus agalactiae NOT DETECTED NOT DETECTED Final   Streptococcus pneumoniae NOT DETECTED NOT DETECTED Final   Streptococcus pyogenes NOT DETECTED NOT DETECTED Final   Acinetobacter baumannii NOT DETECTED NOT DETECTED Final   Enterobacteriaceae species NOT DETECTED NOT DETECTED Final   Enterobacter cloacae complex NOT DETECTED NOT DETECTED Final   Escherichia coli NOT DETECTED NOT DETECTED Final   Klebsiella oxytoca NOT DETECTED NOT DETECTED Final   Klebsiella pneumoniae NOT DETECTED NOT DETECTED Final   Proteus  species NOT DETECTED NOT DETECTED Final   Serratia marcescens NOT DETECTED NOT DETECTED Final   Haemophilus influenzae NOT DETECTED NOT DETECTED Final   Neisseria meningitidis NOT DETECTED NOT DETECTED Final   Pseudomonas aeruginosa NOT DETECTED NOT DETECTED Final   Candida albicans NOT DETECTED NOT DETECTED Final   Candida glabrata NOT DETECTED NOT DETECTED Final   Candida krusei NOT DETECTED NOT DETECTED Final   Candida parapsilosis NOT DETECTED NOT DETECTED Final   Candida tropicalis NOT DETECTED NOT DETECTED Final  Culture, blood (Routine X 2) w Reflex to ID Panel     Status: None (Preliminary result)   Collection Time: 01/26/17  3:01 PM  Result Value Ref Range Status   Specimen Description BLOOD RIGHT HAND  Final   Special Requests IN PEDIATRIC BOTTLE Blood Culture adequate volume  Final   Culture NO GROWTH 2 DAYS  Final   Report Status PENDING  Incomplete         Radiology Studies: Ct Thoracic Spine W Contrast  Result Date: 01/27/2017 CLINICAL DATA:  Chronic back pain EXAM: CT THORACIC AND LUMBAR SPINE WITH AND WITHOUT CONTRAST TECHNIQUE: Multidetector CT imaging of the thoracic and lumbar spine was performed with intravenous contrast. Multiplanar CT image reconstructions were also generated. CONTRAST:  ISOVUE-300 IOPAMIDOL (ISOVUE-300) INJECTION 61% COMPARISON:  None. FINDINGS: CT THORACIC SPINE FINDINGS Alignment: Physiologic Vertebrae: Multilevel anterior osteophyte formation. No acute fracture. No significant height loss. No focal osseous lesions. Paraspinal and other soft tissues: Right upper lobe nodule measures 1.6 x 1.1 cm. Incompletely visualized partially necrotic lesion also seen within the right upper lobe. Disc levels: No spinal canal stenosis. No large disc herniation is visible. CT LUMBAR SPINE FINDINGS Segmentation: 5 lumbar type vertebrae. Alignment: Normal. Vertebrae: No acute fracture. No erosive change. There is degenerative endplate remodeling at L2-L3 and  L4-L5 with associated osteophyte formation. Paraspinal and other soft tissues: Aortic atherosclerosis. Disc levels: Above the T12 level, there is no disc herniation, spinal canal stenosis or neuroforaminal stenosis. T12-L1: Normal disc space and facets. No spinal canal or neuroforaminal stenosis. L1-L2: Normal disc and facets.  No stenosis. L2-L3: Disc space narrowing with small disc bulge. Moderate left neural foraminal stenosis. L3-L4: Small disc bulge with mild bilateral neural foraminal stenosis. L4-L5: Small disc bulge with bilateral mild facet hypertrophy. Mild spinal canal stenosis. Moderate right neural foraminal stenosis. L5-S1: Disc space narrowing with endplate osteophyte formation and small disc bulge. Severe right and mild left neural foraminal stenosis. Visualized sacrum: Normal. IMPRESSION: 1.  Incompletely visualized partially necrotic areas of consolidation in the right lung. This may indicate necrotizing pneumonia or partially necrotic tumor. Dedicated chest CT is recommended. 2. No acute abnormality of the lumbar spine. 3. Severe right L5-S1 neural foraminal stenosis. 4. Moderate right L4-L5 and left L2-L3 neural foraminal stenosis. Electronically Signed   By: Deatra Robinson M.D.   On: 01/27/2017 15:17   Ct Lumbar Spine W Contrast  Result Date: 01/27/2017 CLINICAL DATA:  Chronic back pain EXAM: CT THORACIC AND LUMBAR SPINE WITH AND WITHOUT CONTRAST TECHNIQUE: Multidetector CT imaging of the thoracic and lumbar spine was performed with intravenous contrast. Multiplanar CT image reconstructions were also generated. CONTRAST:  ISOVUE-300 IOPAMIDOL (ISOVUE-300) INJECTION 61% COMPARISON:  None. FINDINGS: CT THORACIC SPINE FINDINGS Alignment: Physiologic Vertebrae: Multilevel anterior osteophyte formation. No acute fracture. No significant height loss. No focal osseous lesions. Paraspinal and other soft tissues: Right upper lobe nodule measures 1.6 x 1.1 cm. Incompletely visualized partially  necrotic lesion also seen within the right upper lobe. Disc levels: No spinal canal stenosis. No large disc herniation is visible. CT LUMBAR SPINE FINDINGS Segmentation: 5 lumbar type vertebrae. Alignment: Normal. Vertebrae: No acute fracture. No erosive change. There is degenerative endplate remodeling at L2-L3 and L4-L5 with associated osteophyte formation. Paraspinal and other soft tissues: Aortic atherosclerosis. Disc levels: Above the T12 level, there is no disc herniation, spinal canal stenosis or neuroforaminal stenosis. T12-L1: Normal disc space and facets. No spinal canal or neuroforaminal stenosis. L1-L2: Normal disc and facets.  No stenosis. L2-L3: Disc space narrowing with small disc bulge. Moderate left neural foraminal stenosis. L3-L4: Small disc bulge with mild bilateral neural foraminal stenosis. L4-L5: Small disc bulge with bilateral mild facet hypertrophy. Mild spinal canal stenosis. Moderate right neural foraminal stenosis. L5-S1: Disc space narrowing with endplate osteophyte formation and small disc bulge. Severe right and mild left neural foraminal stenosis. Visualized sacrum: Normal. IMPRESSION: 1. Incompletely visualized partially necrotic areas of consolidation in the right lung. This may indicate necrotizing pneumonia or partially necrotic tumor. Dedicated chest CT is recommended. 2. No acute abnormality of the lumbar spine. 3. Severe right L5-S1 neural foraminal stenosis. 4. Moderate right L4-L5 and left L2-L3 neural foraminal stenosis. Electronically Signed   By: Deatra Robinson M.D.   On: 01/27/2017 15:17      Scheduled Meds: . carvedilol  3.125 mg Oral BID  . mupirocin ointment  1 application Nasal BID  . senna-docusate  1 tablet Oral BID  . sodium chloride flush  3 mL Intravenous Q12H   Continuous Infusions: . sodium chloride    .  ceFAZolin (ANCEF) IV Stopped (01/28/17 1510)     LOS: 7 days    Time spent in minutes: 35 min  Penny Pia, MD [facility] Pager: [email or web address] Password [facility] 01/28/2017, 6:32 PM

## 2017-01-28 NOTE — Care Management Important Message (Signed)
Important Message  Patient Details  Name: Patrick Knox MRN: [id number] Date of Birth: 03-13-1941   Medicare Important Message Given:  Yes    Kyla Balzarine 01/28/2017, 4:10 PM

## 2017-01-28 NOTE — Progress Notes (Signed)
Progress Note  Patient Name: Patrick Knox Date of Encounter: 01/28/2017  Primary Cardiologist: Patrick Knox  Subjective   C/w some back/neck pain, otherwise has no complaints, denies SOB or CP, no pain at either the extraction site or temp-perm.  Back pain better and neck also   Inpatient Medications    Scheduled Meds: . carvedilol  3.125 mg Oral BID  . mupirocin ointment  1 application Nasal BID  . senna-docusate  1 tablet Oral BID  . sodium chloride flush  3 mL Intravenous Q12H   Continuous Infusions: . sodium chloride    .  ceFAZolin (ANCEF) IV 2 g (01/28/17 1440)   PRN Meds: sodium chloride, [DISCONTINUED] acetaminophen **OR** acetaminophen, acetaminophen, HYDROcodone-acetaminophen, ondansetron (ZOFRAN) IV, sodium chloride flush   Vital Signs    Vitals:   01/27/17 1502 01/27/17 2023 01/28/17 0500 01/28/17 1404  BP: 124/71 121/67 114/72 120/67  Pulse: 60 (!) 59 61 (!) 59  Resp:  Temp: 98 F (36.7 C) 99.9 F (37.7 C) (!) 97.5 F (36.4 C) 98.2 F (36.8 C)  TempSrc: Oral Oral Oral Oral  SpO2: 100% 99% 99% 100%  Weight:   148 lb 6.4 oz (67.3 kg)   Height:        Intake/Output Summary (Last 24 hours) at 01/28/17 1442 Last data filed at 01/28/17 0504  Gross per 24 hour  Intake              440 ml  Output             1000 ml  Net             -560 ml   Filed Weights   01/26/17 0422 01/27/17 0436 01/28/17 0500  Weight: 147 lb 14.4 oz (67.1 kg) 150 lb 8 oz (68.3 kg) 148 lb 6.4 oz (67.3 kg)    Telemetry    asynchronously V pacing - Personally Reviewed  ECG    No new EKGs - Personally Reviewed  Physical Exam   GEN: No acute distress.  Thin and frail appearing, ill, alert today Neck: 7 cm JVD Cardiac: RRR, no murmurs, rubs, or gallops.  Respiratory: CTA b/l. Temp perm L neck site is stable, extraction site is stable, sutres remain in place, wic at lateral edge GI: Soft, nontender, non-distended  MS: No edema; No deformity. Neuro:   Nonfocal ,  remains oriented to self, place, not year, knows the current president and better  Psych: Normal affect   Labs    Chemistry  Recent Labs Lab 01/24/17 0605 01/26/17 0535 01/27/17 0543 01/28/17 0422  NA 133* 139 134* 134*  K 4.1 4.7 4.8 5.2*  CL 109 109 106 104  CO2 16* 20* 21* 22  GLUCOSE 149* 135* 128* 116*  BUN 61* 53* 43* 38*  CREATININE 1.88* 1.62* 1.41* 1.25*  CALCIUM 8.4* 8.6* 8.4* 9.1  PROT 5.8*  --   --   --   ALBUMIN 2.0*  --   --   --   AST 78*  --   --   --   ALT 29  --   --   --   ALKPHOS 47  --   --   --   BILITOT 0.9  --   --   --   GFRNONAA 33* 40* 47* 55*  GFRAA 39* 46* 55* >60  ANIONGAP Hematology  Recent Labs Lab 01/24/17 0605 01/25/17 0213 01/26/17 0535  WBC 15.1*  15.0* 14.1*  RBC 4.68 4.48 4.31  HGB 13.3 12.9* 11.9*  HCT 37.9* 36.5* 36.3*  MCV 81.0 81.5 84.2  MCH 28.4 28.8 27.6  MCHC 35.1 35.3 32.8  RDW 15.5 16.1* 16.2*  PLT 160 209 340    Cardiac Enzymes  Recent Labs Lab 01/21/17 1530 01/21/17 2329 01/22/17 0509 01/22/17 0925  TROPONINI 0.18* 0.17* 0.15* 0.13*   No results for input(s): TROPIPOC in the last 168 hours.   BNP  Recent Labs Lab 01/21/17 2329  BNP 764.7*     DDimer No results for input(s): DDIMER in the last 168 hours.   Radiology    Dg Chest 2 View Result Date: 01/26/2017 CLINICAL DATA:  Status post permanent pacemaker change out. EXAM: CHEST  2 VIEW COMPARISON:  Portable chest x-ray of January 21, 2017 FINDINGS: The previous pacemaker and electrodes have been removed. A single electrode has been placed with the tip in the right ventricular apex. The generator lies above the left clavicle. The lungs are adequately inflated. There is no postprocedure pneumothorax. There is stable scarring at the left lung base. The heart is top-normal in size. The pulmonary vascularity is normal. There is calcification in the wall of the aortic arch. The patient has undergone previous CABG. IMPRESSION: No  postprocedure complication following permanent pacemaker change out. Previous CABG.  Thoracic aortic atherosclerosis. Electronically Signed   By: Patrick  Knox M.D.   On: 01/26/2017 07:25    Cardiac Studies    initial and second BC with staph, 3rd drawn yesterday post PPM extraction neg to date  Patient Profile     76 y.o. male admitted with altered mental status, growing out staph aureus now, with drainage from PM pocket. He was hypotensive and this has resolved. His renal function has improved.  Assessment & Plan    1. MSSA with PM pocket infection      SJM BiVe pacer implanted 02/18/16     staph UTI as well      S/p PPM system extraction w/temp-perm implant 01/25/17      Approximately 11-12inches of wic removed again from extraction site again today, patient tolerated well Plan is to remove 12" or so every day until all has been removed, remains with some brownish colored drainage, if patient is to be discharged prior to complete removal of packing, will need wound care RN to complete      His MS and overall clinical status continues to improve. BMET looks OK, Creat continues downward VSS, remains afebrile  2. CHB      Temp-perm in place, L IJ Re-implant timing will follow ID recommendations,  BC post extraction neg x 2days Planned for 6 weeks antibiotics by ID recommendations Re-implant after antibiotics, or when cultures become negative I have discussed with Dr. Graciela Knox, would not implant prior to 2 weeks, office follow up that week I have arranged a wound check, suture removal for next week for extraction site, awaiting final decision in re-implantation   3. Chronic systolic heart failure        he appears to be well compensated.     4. Paroxysmal Afib     CHA2DS2Vasc is 4     INR today 1.61     I discussed with Dr. Graciela Knox, continue off a/c   5. CAD (remote CABG)     No anginal complaints  6. Back pain     Unable to do MRI with pacer     CTs done yesterday, no bony  lesions,  appears a partially necrotic area R deferred to medicine   For questions or updates, please contact [facility] Please consult [email or web address] for contact info under Cardiology/STEMI.        Signed, Patrick Pigeon, PA-C  01/28/2017, 2:42 PM  Patient ID: Patrick Knox, male   DOB: 1940-07-20, 77 y.o.   MRN: [id number]    As above  Seen and examined   Stable w temp perm  Will see again Monday

## 2017-01-29 DIAGNOSIS — Z959 Presence of cardiac and vascular implant and graft, unspecified: Secondary | ICD-10-CM

## 2017-01-29 DIAGNOSIS — T827XXA Infection and inflammatory reaction due to other cardiac and vascular devices, implants and grafts, initial encounter: Secondary | ICD-10-CM

## 2017-01-29 LAB — PROTIME-INR
INR: 1.42
Prothrombin Time: 17.2 seconds — ABNORMAL HIGH (ref 11.4–15.2)

## 2017-01-29 LAB — GLUCOSE, CAPILLARY: Glucose-Capillary: 129 mg/dL — ABNORMAL HIGH (ref 65–99)

## 2017-01-29 NOTE — Progress Notes (Signed)
Progress Note  Patient Name: Patrick Knox Date of Encounter: 01/29/2017  Primary Cardiologist: Jens Som  Subjective   Resting comfortably without issue. 12 inches gauze removed from pacemaker wound.  Inpatient Medications    Scheduled Meds: . carvedilol  3.125 mg Oral BID  . mupirocin ointment  1 application Nasal BID  . senna-docusate  1 tablet Oral BID  . sodium chloride flush  3 mL Intravenous Q12H   Continuous Infusions: . sodium chloride    .  ceFAZolin (ANCEF) IV Stopped (01/29/17 0556)   PRN Meds: sodium chloride, [DISCONTINUED] acetaminophen **OR** acetaminophen, acetaminophen, HYDROcodone-acetaminophen, ondansetron (ZOFRAN) IV, sodium chloride flush   Vital Signs    Vitals:   01/28/17 1958 01/29/17 0518 01/29/17 0519 01/29/17 0754  BP: 110/79 128/90  124/71  Pulse: 60 61  (!) 54  Resp: (!) Temp: 99.6 F (37.6 C) 98.6 F (37 C)  98.1 F (36.7 C)  TempSrc: Oral Oral  Oral  SpO2: 100% 100%    Weight:   146 lb 8 oz (66.5 kg)   Height:        Intake/Output Summary (Last 24 hours) at 01/29/17 0841 Last data filed at 01/29/17 0759  Gross per 24 hour  Intake             1440 ml  Output             2050 ml  Net             -610 ml   Filed Weights   01/27/17 0436 01/28/17 0500 01/29/17 0519  Weight: 150 lb 8 oz (68.3 kg) 148 lb 6.4 oz (67.3 kg) 146 lb 8 oz (66.5 kg)    Telemetry    V pacing, asynchronous  - Personally Reviewed  ECG    No new EKGs - Personally Reviewed  Physical Exam   GEN: Well nourished, well developed, in no acute distress  HEENT: normal  Neck: no JVD, carotid bruits, or masses Cardiac: RRR; no murmurs, rubs, or gallops,no edema  Respiratory:  clear to auscultation bilaterally, normal work of breathing GI: soft, nontender, nondistended, + BS MS: no deformity or atrophy  Skin: warm and dry, Device site loosly sutured with pus Neuro:  Strength and sensation are intact Psych: euthymic mood, full affect   Labs      Chemistry  Recent Labs Lab 01/24/17 0605 01/26/17 0535 01/27/17 0543 01/28/17 0422  NA 133* 139 134* 134*  K 4.1 4.7 4.8 5.2*  CL 109 109 106 104  CO2 16* 20* 21* 22  GLUCOSE 149* 135* 128* 116*  BUN 61* 53* 43* 38*  CREATININE 1.88* 1.62* 1.41* 1.25*  CALCIUM 8.4* 8.6* 8.4* 9.1  PROT 5.8*  --   --   --   ALBUMIN 2.0*  --   --   --   AST 78*  --   --   --   ALT 29  --   --   --   ALKPHOS 47  --   --   --   BILITOT 0.9  --   --   --   GFRNONAA 33* 40* 47* 55*  GFRAA 39* 46* 55* >60  ANIONGAP Hematology  Recent Labs Lab 01/24/17 0605 01/25/17 0213 01/26/17 0535  WBC 15.1* 15.0* 14.1*  RBC 4.68 4.48 4.31  HGB 13.3 12.9* 11.9*  HCT 37.9* 36.5* 36.3*  MCV 81.0 81.5 84.2  MCH 28.4 28.8 27.6  MCHC 35.1 35.3 32.8  RDW 15.5 16.1* 16.2*  PLT 160 209 340    Cardiac Enzymes  Recent Labs Lab 01/22/17 0925  TROPONINI 0.13*   No results for input(s): TROPIPOC in the last 168 hours.   BNP No results for input(s): BNP, PROBNP in the last 168 hours.   DDimer No results for input(s): DDIMER in the last 168 hours.   Radiology    Dg Chest 2 View Result Date: 01/26/2017 CLINICAL DATA:  Status post permanent pacemaker change out. EXAM: CHEST  2 VIEW COMPARISON:  Portable chest x-ray of January 21, 2017 FINDINGS: The previous pacemaker and electrodes have been removed. A single electrode has been placed with the tip in the right ventricular apex. The generator lies above the left clavicle. The lungs are adequately inflated. There is no postprocedure pneumothorax. There is stable scarring at the left lung base. The heart is top-normal in size. The pulmonary vascularity is normal. There is calcification in the wall of the aortic arch. The patient has undergone previous CABG. IMPRESSION: No postprocedure complication following permanent pacemaker change out. Previous CABG.  Thoracic aortic atherosclerosis. Electronically Signed   By: David  [place] M.D.   On:  01/26/2017 07:25    Cardiac Studies    initial and second BC with staph, 3rd drawn yesterday post PPM extraction neg to date  Patient Profile     76 y.o. male admitted with altered mental status, growing out staph aureus now, with drainage from PM pocket. He was hypotensive and this has resolved. His renal function has improved.  Assessment & Plan    1. MSSA with PM pocket infection      Post extraction of BiV pacemaker 9/*25/18, implanted 02/18/16. Removed 12 inches gauze today.       Approximately 11-12inches of wic removed again from extraction site again today, patient tolerated well Plan is to remove 12" or so every day until all has been removed, remains with some brownish colored drainage, if patient is to be discharged prior to complete removal of packing, Jaylee Freeze need wound care RN to complete      VSS, clinically stable.   2. CHB      Temp-perm pacemaker in place through left IJ.  Would follow up in office in 2 weeks for possible reimplantation if cultures remain negative. Wound check and suture removal appointment made for next week.   3. Chronic systolic heart failure       Appears well compensated. No changes.    4. Paroxysmal Afib     CHA2DS2Vasc is 4     Continue off anticoagulation with acute issues, in sinus rhythm  5. CAD (remote CABG)     No angina  6. Back pain     CT done yesterday. Therapy per IM.   For questions or updates, please contact [facility] Please consult [email or web address] for contact info under Cardiology/STEMI.        Signed, Stephania Macfarlane Jorja Loa, MD  01/29/2017, 8:41 AM  Patient ID: Patrick Knox, male   DOB: 1940/08/11, 76 y.o.   MRN: [id number]

## 2017-01-29 NOTE — Progress Notes (Signed)
PROGRESS NOTE    Christop Hippert   ZOX:[id number]  DOB: 09-29-1940  DOA: 01/21/2017 PCP: Kirby Funk, MD   Brief Narrative:  [name] 76 y.o. male with PPM, history of CAD with CABG, HTN, sCHF, A-fib on Coumadin, complete heart block with pacemaker, CKD 3, tobacco abuse presents from home with confusion and weakness. Apparently also started Flexeril recently. Prior to coming in to the hospital, per wife, he was very active and volunteers with the church etc.  Found to have BP 86/46, sodium 127, exposed pacer wires and and pus coming out of pacemaker pocket. Blood cultures 2/2 for MSSA.   Subjective: Patient reports no new complaints. No new problems reported to me   Assessment & Plan:   Principal Problem:   Sepsis, MSSA bacteremia and in UA (likely due to hematogenic spread) -  pacemaker infection with noted ulcer/ pus at site -  fevers / encephalopathic - repeat blood cultures still + likely he still has a pacer   - pacer was infected and removed. Patient has temporary pacer - TTE  Today > no endocarditis but has a mobile density on a pacer lead - Antibiotics per ID recommendations - Last blood culture pending but still negative. If blood cultures remain negative plan then can begin discharge planning. Cardiology team wants to wait 2 weeks prior to placing permanent pacemaker. Pt will need home health nursing to assist with dressing changes.   Active Problems:    Acute metabolic encephalopathy - resolving.    Paroxysmal atrial fibrillation (HCC) - Continue coreg. - Heparin for now due to procedures- resume Coumadin per cardiology    Hypotension with h/o Hypertension - Coreg- lower than home dose for now- holding Lisinopril    Hyponatremia, Acute renal failure superimposed on stage 3 chronic kidney disease (HCC) - baseline Cr 1.1 - admitted with Cr 3.4- likely prerenal and ATN in setting of ACE I and sepsis - steadily improving - d/c IVF 9/23- appeared to be  eating/drinking well and had good urine output- cont to follow - Cr improving- will reassess serum creatinine.  Metabolic acidosis - Resolving- likely from AKI and sepsis    Chronic systolic CHF (congestive heart failure) (HCC) - ECHO this admission: EF 35-40% with focal hypokinesis, grade 1 dCHF - not on chronic diuretics    Hypotension/  Resolved  Hyponatremia - mild. Will most likely correct with improvement in oral intake.  Mildly elevated troponin - likely due to sepsis/ hypotension, no chest pain reported.    Tobacco abuse - counseling - states he quit smoking about 2 wks prior to admission- d/c patch  DVT prophylaxis: Heparin Code Status: Full code Family Communication: none at bedside Disposition Plan: pending final recommendations from ID. Once blood culture remain negative will recheck out to cardiology for pacemaker placement  Consultants:   Cardiology  ID  Procedures:  2 D ECHO Study Conclusions  - Left ventricle: The cavity size was mildly dilated. Systolic   function was moderately reduced. The estimated ejection fraction   was in the range of 35% to 40%. Diffuse hypokinesis. Dyskinesis   of the basal inferolateral myocardium. Akinesis of the basal   inferior myocardium. Doppler parameters are consistent with   abnormal left ventricular relaxation (grade 1 diastolic   dysfunction). - Aortic valve: Transvalvular velocity was within the normal range.   There was no stenosis. There was no regurgitation. - Mitral valve: Transvalvular velocity was within the normal range.   There was no evidence for stenosis. There  was trivial   regurgitation. - Right ventricle: The cavity size was normal. Wall thickness was   normal. Systolic function was mildly reduced. - Tricuspid valve: There was mild regurgitation. - Pulmonary arteries: Systolic pressure was within the normal   range. PA peak pressure: 27 mm Hg (S).  TEE small mobile echodensity on one of 3 pacer  leads at the junction of SVC/RA that may represent small mobile thrombus or vegetation. No other obvious lesions noted on pacer wires.    - EF 35-40%  - Mild MR  - Mild TR, myomatous/thickened tricuspid valve. No obvious vegetations.   - No obvious valvular vegetations  Antimicrobials:  Anti-infectives    Start     Dose/Rate Route Frequency Ordered Stop   01/25/17 1525  gentamicin (GARAMYCIN) 80 mg in sodium chloride irrigation 0.9 % 500 mL irrigation  Status:  Discontinued       As needed 01/25/17 1526 01/25/17 1614   01/25/17 1300  gentamicin (GARAMYCIN) 80 mg in sodium chloride irrigation 0.9 % 500 mL irrigation     80 mg Irrigation To Cath Lab 01/25/17 1240 01/25/17 1525   01/25/17 1300  ceFAZolin (ANCEF) IVPB 2g/100 mL premix     2 g 200 mL/hr over 30 Minutes Intravenous To Cath Lab 01/25/17 1240 01/25/17 1421   01/24/17 1030  gentamicin (GARAMYCIN) 80 mg in sodium chloride irrigation 0.9 % 500 mL irrigation  Status:  Discontinued     80 mg Irrigation To Cath Lab 01/24/17 0944 01/24/17 1451   01/24/17 0945  ceFAZolin (ANCEF) IVPB 2g/100 mL premix  Status:  Discontinued     2 g 200 mL/hr over 30 Minutes Intravenous On call 01/24/17 0944 01/24/17 1451   01/23/17 1600  ceFAZolin (ANCEF) IVPB 2g/100 mL premix     2 g 200 mL/hr over 30 Minutes Intravenous Every 8 hours 01/23/17 1331     01/22/17 1100  ceFAZolin (ANCEF) IVPB 2g/100 mL premix  Status:  Discontinued     2 g 200 mL/hr over 30 Minutes Intravenous Every 12 hours 01/22/17 1046 01/23/17 1331   01/22/17 0500  vancomycin (VANCOCIN) IVPB 750 mg/150 ml premix  Status:  Discontinued     750 mg 150 mL/hr over 60 Minutes Intravenous Every 24 hours 01/21/17 1107 01/22/17 1030   01/22/17 0000  piperacillin-tazobactam (ZOSYN) IVPB 2.25 g  Status:  Discontinued     2.25 g 100 mL/hr over 30 Minutes Intravenous Every 8 hours 01/21/17 2305 01/22/17 1030   01/21/17 1115  vancomycin (VANCOCIN) IVPB 1000 mg/200 mL premix     1,000  mg 200 mL/hr over 60 Minutes Intravenous  Once 01/21/17 1104 01/21/17 1257   01/21/17 1100  piperacillin-tazobactam (ZOSYN) IVPB 3.375 g     3.375 g 100 mL/hr over 30 Minutes Intravenous  Once 01/21/17 1051 01/21/17 1150       Objective: Vitals:   01/29/17 0519 01/29/17 0754 01/29/17 0900 01/29/17 1410  BP:  124/71 110/63 (!) 121/94  Pulse:  (!) 54 61 60  Resp:  20  17  Temp:  98.1 F (36.7 C) 98.2 F (36.8 C) 97.8 F (36.6 C)  TempSrc:  Oral Oral Axillary  SpO2:   100% 100%  Weight: 66.5 kg (146 lb 8 oz)     Height:        Intake/Output Summary (Last 24 hours) at 01/29/17 1736 Last data filed at 01/29/17 1644  Gross per 24 hour  Intake  1140 ml  Output             2500 ml  Net            -1360 ml   Filed Weights   01/27/17 0436 01/28/17 0500 01/29/17 0519  Weight: 68.3 kg (150 lb 8 oz) 67.3 kg (148 lb 6.4 oz) 66.5 kg (146 lb 8 oz)    Examination:Exam unchanged compared to 01/28/2017 General exam: Appears comfortable in nad. HEENT: PERRLA, oral mucosa moist, no sclera icterus or thrush Respiratory system: Clear to auscultation. Respiratory effort normal. Equal chest rise. Cardiovascular system: S1 & S2 heard, RRR.  No rubs or murmurs Gastrointestinal system: Abdomen soft, non-tender, nondistended. Normal bowel sound. No organomegaly Central nervous system:   No focal neurological deficits. Extremities: No cyanosis, clubbing or edema Skin:   Patient has gauze over left upper chest Psychiatry:  Difficult to assess as sleepy    Data Reviewed: I have personally reviewed following labs and imaging studies  CBC:  Recent Labs Lab 01/23/17 0324 01/24/17 0605 01/25/17 0213 01/26/17 0535  WBC 11.7* 15.1* 15.0* 14.1*  HGB 13.6 13.3 12.9* 11.9*  HCT 39.7 37.9* 36.5* 36.3*  MCV 83.4 81.0 81.5 84.2  PLT 183 160 209 340   Basic Metabolic Panel:  Recent Labs Lab 01/23/17 0324 01/24/17 0605 01/26/17 0535 01/27/17 0543 01/28/17 0422  NA 136 133* 139  134* 134*  K 3.8 4.1 4.7 4.8 5.2*  CL 107 109 109 106 104  CO2 21* 16* 20* 21* 22  GLUCOSE 142* 149* 135* 128* 116*  BUN 68* 61* 53* 43* 38*  CREATININE 2.18* 1.88* 1.62* 1.41* 1.25*  CALCIUM 8.8* 8.4* 8.6* 8.4* 9.1   GFR: Estimated Creatinine Clearance: 48 mL/min (A) (by C-G formula based on SCr of 1.25 mg/dL (H)). Liver Function Tests:  Recent Labs Lab 01/24/17 0605  AST 78*  ALT 29  ALKPHOS 47  BILITOT 0.9  PROT 5.8*  ALBUMIN 2.0*   No results for input(s): LIPASE, AMYLASE in the last 168 hours. No results for input(s): AMMONIA in the last 168 hours. Coagulation Profile:  Recent Labs Lab 01/25/17 1249 01/26/17 0535 01/27/17 1020 01/28/17 0422 01/29/17 0531  INR 2.41 3.10 3.03 1.61 1.42   Cardiac Enzymes: No results for input(s): CKTOTAL, CKMB, CKMBINDEX, TROPONINI in the last 168 hours. BNP (last 3 results) No results for input(s): PROBNP in the last 8760 hours. HbA1C: No results for input(s): HGBA1C in the last 72 hours. CBG:  Recent Labs Lab 01/25/17 0803 01/26/17 0746 01/27/17 0727 01/28/17 0733 01/29/17 0737  GLUCAP 133* 169* 181* 135* 129*   Lipid Profile: No results for input(s): CHOL, HDL, LDLCALC, TRIG, CHOLHDL, LDLDIRECT in the last 72 hours. Thyroid Function Tests: No results for input(s): TSH, T4TOTAL, FREET4, T3FREE, THYROIDAB in the last 72 hours. Anemia Panel: No results for input(s): VITAMINB12, FOLATE, FERRITIN, TIBC, IRON, RETICCTPCT in the last 72 hours. Urine analysis:    Component Value Date/Time   COLORURINE YELLOW 01/21/2017 1215   APPEARANCEUR CLOUDY (A) 01/21/2017 1215   LABSPEC 1.025 01/21/2017 1215   PHURINE 5.5 01/21/2017 1215   GLUCOSEU NEGATIVE 01/21/2017 1215   HGBUR LARGE (A) 01/21/2017 1215   BILIRUBINUR NEGATIVE 01/21/2017 1215   KETONESUR NEGATIVE 01/21/2017 1215   PROTEINUR >300 (A) 01/21/2017 1215   NITRITE NEGATIVE 01/21/2017 1215   LEUKOCYTESUR NEGATIVE 01/21/2017 1215   Sepsis  Labs: (procalcitonin:4,lacticidven:4) ) Recent Results (from the past 240 hour(s))  Culture, blood (routine x 2)     Status:  Abnormal   Collection Time: 01/21/17 10:50 AM  Result Value Ref Range Status   Specimen Description BLOOD LEFT FOREARM  Final   Special Requests   Final    BOTTLES DRAWN AEROBIC AND ANAEROBIC Blood Culture adequate volume   Culture  Setup Time   Final    GRAM POSITIVE COCCI IN CLUSTERS IN BOTH AEROBIC AND ANAEROBIC BOTTLES CRITICAL VALUE NOTED.  VALUE IS CONSISTENT WITH PREVIOUSLY REPORTED AND CALLED VALUE.    Culture (A)  Final    STAPHYLOCOCCUS AUREUS SUSCEPTIBILITIES PERFORMED ON PREVIOUS CULTURE WITHIN THE LAST 5 DAYS. Performed at [facility], [address], [place], [place] [address]    Report Status 01/24/2017 FINAL  Final  Culture, blood (routine x 2)     Status: Abnormal   Collection Time: 01/21/17 11:08 AM  Result Value Ref Range Status   Specimen Description BLOOD LEFT WRIST  Final   Special Requests   Final    BOTTLES DRAWN AEROBIC AND ANAEROBIC Blood Culture adequate volume   Culture  Setup Time   Final    GRAM POSITIVE COCCI IN CLUSTERS IN BOTH AEROBIC AND ANAEROBIC BOTTLES CRITICAL RESULT CALLED TO, READ BACK BY AND VERIFIED WITH: K.COOK PHARMD 01/22/17 0408 L.CHAMPION Performed at [facility], [address] [place], [place] [address]    Culture STAPHYLOCOCCUS AUREUS (A)  Final   Report Status 01/24/2017 FINAL  Final   Organism ID, Bacteria STAPHYLOCOCCUS AUREUS  Final      Susceptibility   Staphylococcus aureus - MIC*    CIPROFLOXACIN <=0.5 SENSITIVE Sensitive     ERYTHROMYCIN <=0.25 SENSITIVE Sensitive     GENTAMICIN <=0.5 SENSITIVE Sensitive     OXACILLIN 0.5 SENSITIVE Sensitive     TETRACYCLINE <=1 SENSITIVE Sensitive     VANCOMYCIN <=0.5 SENSITIVE Sensitive     TRIMETH/SULFA <=10 SENSITIVE Sensitive     CLINDAMYCIN <=0.25 SENSITIVE Sensitive     RIFAMPIN <=0.5 SENSITIVE Sensitive     Inducible  Clindamycin NEGATIVE Sensitive     * STAPHYLOCOCCUS AUREUS  Blood Culture ID Panel (Reflexed)     Status: Abnormal   Collection Time: 01/21/17 11:08 AM  Result Value Ref Range Status   Enterococcus species NOT DETECTED NOT DETECTED Final   Vancomycin resistance NOT DETECTED NOT DETECTED Final   Listeria monocytogenes NOT DETECTED NOT DETECTED Final   Staphylococcus species DETECTED (A) NOT DETECTED Final    Comment: CRITICAL RESULT CALLED TO, READ BACK BY AND VERIFIED WITH: K.COOK PHARMD 01/22/17 0408 L.CHAMPION    Staphylococcus aureus DETECTED (A) NOT DETECTED Final    Comment: CRITICAL RESULT CALLED TO, READ BACK BY AND VERIFIED WITH: K.COOK PHARMD 01/22/17 0408 L.CHAMPION    Methicillin resistance NOT DETECTED NOT DETECTED Final   Streptococcus species NOT DETECTED NOT DETECTED Final   Streptococcus agalactiae NOT DETECTED NOT DETECTED Final   Streptococcus pneumoniae NOT DETECTED NOT DETECTED Final   Streptococcus pyogenes NOT DETECTED NOT DETECTED Final   Acinetobacter baumannii NOT DETECTED NOT DETECTED Final   Enterobacteriaceae species NOT DETECTED NOT DETECTED Final   Enterobacter cloacae complex NOT DETECTED NOT DETECTED Final   Escherichia coli NOT DETECTED NOT DETECTED Final   Klebsiella oxytoca NOT DETECTED NOT DETECTED Final   Klebsiella pneumoniae NOT DETECTED NOT DETECTED Final   Proteus species NOT DETECTED NOT DETECTED Final   Serratia marcescens NOT DETECTED NOT DETECTED Final   Carbapenem resistance NOT DETECTED NOT DETECTED Final   Haemophilus influenzae NOT DETECTED NOT DETECTED Final   Neisseria  meningitidis NOT DETECTED NOT DETECTED Final   Pseudomonas aeruginosa NOT DETECTED NOT DETECTED Final   Candida albicans NOT DETECTED NOT DETECTED Final   Candida glabrata NOT DETECTED NOT DETECTED Final   Candida krusei NOT DETECTED NOT DETECTED Final   Candida parapsilosis NOT DETECTED NOT DETECTED Final   Candida tropicalis NOT DETECTED NOT DETECTED Final     Comment: Performed at [facility], [address], [place], [place] [address]  Urine culture     Status: Abnormal   Collection Time: 01/21/17 12:15 PM  Result Value Ref Range Status   Specimen Description URINE, CLEAN CATCH  Final   Special Requests NONE  Final   Culture 70,000 COLONIES/mL STAPHYLOCOCCUS AUREUS (A)  Final   Report Status 01/23/2017 FINAL  Final   Organism ID, Bacteria STAPHYLOCOCCUS AUREUS (A)  Final      Susceptibility   Staphylococcus aureus - MIC*    CIPROFLOXACIN <=0.5 SENSITIVE Sensitive     GENTAMICIN <=0.5 SENSITIVE Sensitive     NITROFURANTOIN 32 SENSITIVE Sensitive     OXACILLIN <=0.25 SENSITIVE Sensitive     TETRACYCLINE <=1 SENSITIVE Sensitive     VANCOMYCIN <=0.5 SENSITIVE Sensitive     TRIMETH/SULFA <=10 SENSITIVE Sensitive     CLINDAMYCIN <=0.25 SENSITIVE Sensitive     RIFAMPIN <=0.5 SENSITIVE Sensitive     Inducible Clindamycin NEGATIVE Sensitive     * 70,000 COLONIES/mL STAPHYLOCOCCUS AUREUS  Surgical pcr screen     Status: Abnormal   Collection Time: 01/24/17  1:35 AM  Result Value Ref Range Status   MRSA, PCR NEGATIVE NEGATIVE Final   Staphylococcus aureus POSITIVE (A) NEGATIVE Final    Comment: (NOTE) The Xpert SA Assay (FDA approved for NASAL specimens in patients 61 years of age and older), is one component of a comprehensive surveillance program. It is not intended to diagnose infection nor to guide or monitor treatment.   Culture, blood (Routine X 2) w Reflex to ID Panel     Status: Abnormal   Collection Time: 01/24/17  9:45 AM  Result Value Ref Range Status   Specimen Description BLOOD RIGHT HAND  Final   Special Requests IN PEDIATRIC BOTTLE Blood Culture adequate volume  Final   Culture  Setup Time   Final    GRAM POSITIVE COCCI IN CLUSTERS IN PEDIATRIC BOTTLE CRITICAL RESULT CALLED TO, READ BACK BY AND VERIFIED WITH: L. BAJBUS PHARMD, AT 0748 01/25/17 BY D. VANHOOK    Culture STAPHYLOCOCCUS AUREUS (A)  Final   Report Status  01/27/2017 FINAL  Final   Organism ID, Bacteria STAPHYLOCOCCUS AUREUS  Final      Susceptibility   Staphylococcus aureus - MIC*    CIPROFLOXACIN <=0.5 SENSITIVE Sensitive     ERYTHROMYCIN <=0.25 SENSITIVE Sensitive     GENTAMICIN <=0.5 SENSITIVE Sensitive     OXACILLIN 0.5 SENSITIVE Sensitive     TETRACYCLINE <=1 SENSITIVE Sensitive     VANCOMYCIN <=0.5 SENSITIVE Sensitive     TRIMETH/SULFA <=10 SENSITIVE Sensitive     CLINDAMYCIN <=0.25 SENSITIVE Sensitive     RIFAMPIN <=0.5 SENSITIVE Sensitive     Inducible Clindamycin NEGATIVE Sensitive     * STAPHYLOCOCCUS AUREUS  Blood Culture ID Panel (Reflexed)     Status: Abnormal   Collection Time: 01/24/17  9:45 AM  Result Value Ref Range Status   Enterococcus species NOT DETECTED NOT DETECTED Final   Listeria monocytogenes NOT DETECTED NOT DETECTED Final   Staphylococcus species DETECTED (A) NOT DETECTED Final  Comment: CRITICAL RESULT CALLED TO, READ BACK BY AND VERIFIED WITH: L. BAJBUS PHARMD, AT 0454 01/25/17 BY D. VANHOOK    Staphylococcus aureus DETECTED (A) NOT DETECTED Final    Comment: Methicillin (oxacillin) susceptible Staphylococcus aureus (MSSA). Preferred therapy is anti staphylococcal beta lactam antibiotic (Cefazolin or Nafcillin), unless clinically contraindicated. CRITICAL RESULT CALLED TO, READ BACK BY AND VERIFIED WITH: L. BAJBUS PHARMD, AT 0981 01/25/17 BY D. VANHOOK    Methicillin resistance NOT DETECTED NOT DETECTED Final   Streptococcus species NOT DETECTED NOT DETECTED Final   Streptococcus agalactiae NOT DETECTED NOT DETECTED Final   Streptococcus pneumoniae NOT DETECTED NOT DETECTED Final   Streptococcus pyogenes NOT DETECTED NOT DETECTED Final   Acinetobacter baumannii NOT DETECTED NOT DETECTED Final   Enterobacteriaceae species NOT DETECTED NOT DETECTED Final   Enterobacter cloacae complex NOT DETECTED NOT DETECTED Final   Escherichia coli NOT DETECTED NOT DETECTED Final   Klebsiella oxytoca NOT DETECTED  NOT DETECTED Final   Klebsiella pneumoniae NOT DETECTED NOT DETECTED Final   Proteus species NOT DETECTED NOT DETECTED Final   Serratia marcescens NOT DETECTED NOT DETECTED Final   Haemophilus influenzae NOT DETECTED NOT DETECTED Final   Neisseria meningitidis NOT DETECTED NOT DETECTED Final   Pseudomonas aeruginosa NOT DETECTED NOT DETECTED Final   Candida albicans NOT DETECTED NOT DETECTED Final   Candida glabrata NOT DETECTED NOT DETECTED Final   Candida krusei NOT DETECTED NOT DETECTED Final   Candida parapsilosis NOT DETECTED NOT DETECTED Final   Candida tropicalis NOT DETECTED NOT DETECTED Final  Culture, blood (Routine X 2) w Reflex to ID Panel     Status: None (Preliminary result)   Collection Time: 01/26/17  3:01 PM  Result Value Ref Range Status   Specimen Description BLOOD RIGHT HAND  Final   Special Requests IN PEDIATRIC BOTTLE Blood Culture adequate volume  Final   Culture NO GROWTH 3 DAYS  Final   Report Status PENDING  Incomplete         Radiology Studies: No results found.    Scheduled Meds: . carvedilol  3.125 mg Oral BID  . mupirocin ointment  1 application Nasal BID  . senna-docusate  1 tablet Oral BID  . sodium chloride flush  3 mL Intravenous Q12H   Continuous Infusions: . sodium chloride    .  ceFAZolin (ANCEF) IV Stopped (01/29/17 1356)     LOS: 8 days    Time spent in minutes: 35 min  Penny Pia, MD [facility] Pager: [email or web address] Password [facility] 01/29/2017, 5:36 PM

## 2017-01-29 NOTE — Progress Notes (Signed)
Pharmacy Antibiotic Note Patrick Knox is a 76 y.o. male with MSSA bacteremia and PM pocket infection. She is now s/p TEE and plans pacer removal.  Pharmacy following for cefazolin dosing. ID also following.  -SCr= 1.25, CrCl ~ 48  Plan: -Continue Cefazolin 2 grams IV every 8 hours  -Will follow renal function and clinical progress   Height:  (185.4 cm) Weight: 146 lb 8 oz (66.5 kg) IBW/kg (Calculated) : 79.9  Temp (24hrs), Avg:98.6 F (37 C), Min:98.1 F (36.7 C), Max:99.6 F (37.6 C)   Recent Labs Lab 01/23/17 0324 01/24/17 0605 01/25/17 0213 01/26/17 0535 01/27/17 0543 01/28/17 0422  WBC 11.7* 15.1* 15.0* 14.1*  --   --   CREATININE 2.18* 1.88*  --  1.62* 1.41* 1.25*    Estimated Creatinine Clearance: 48 mL/min (A) (by C-G formula based on SCr of 1.25 mg/dL (H)).    No Known Allergies  Antimicrobials this admission: 9/21: BCx: MSSA per BCID in 4/4 9/21 UCx: MSSA   Microbiology results: 9/22 Cefazolin  >>  9/21 Zosyn 9/22 9/21 vancomycin 9/22  Thank you for allowing pharmacy to be a part of this patient's care.  Ladell Pier, PharmD Pharmacy Resident 01/29/2017 8:49 AM

## 2017-01-29 NOTE — Clinical Social Work Note (Signed)
CSW met with patient and family. His wife stated she thought he was returning home at discharge but is agreeable to SNF if it is best for him. First preference SNF's are [facility] and [facility]. Both referrals are still pending. It is difficult for patient's wife to drive past [place] to visit him. No bed offers in the [place] area yet.   Dayton Scrape, [place]

## 2017-01-30 LAB — GLUCOSE, CAPILLARY: GLUCOSE-CAPILLARY: 106 mg/dL — AB (ref 65–99)

## 2017-01-30 LAB — PROTIME-INR
INR: 1.34
Prothrombin Time: 16.5 seconds — ABNORMAL HIGH (ref 11.4–15.2)

## 2017-01-30 MED ORDER — POLYETHYLENE GLYCOL 3350 17 G PO PACK
17.0000 g | PACK | Freq: Every day | ORAL | Status: DC
  Administered 2017-01-30 – 2017-02-05 (×6): 17 g via ORAL
  Filled 2017-01-30 (×7): qty 1

## 2017-01-30 MED ORDER — BISACODYL 5 MG PO TBEC
5.0000 mg | DELAYED_RELEASE_TABLET | Freq: Once | ORAL | Status: DC | PRN
  Filled 2017-01-30 (×2): qty 1

## 2017-01-30 NOTE — Progress Notes (Signed)
PROGRESS NOTE    Patrick Knox   BJY:[id number]  DOB: December 29, 1940  DOA: 01/21/2017 PCP: Kirby Funk, MD   Brief Narrative:  Patrick Knox 76 y.o. male with PPM, history of CAD with CABG, HTN, sCHF, A-fib on Coumadin, complete heart block with pacemaker, CKD 3, tobacco abuse presents from home with confusion and weakness. Apparently also started Flexeril recently. Prior to coming in to the hospital, per wife, he was very active and volunteers with the church etc.  Found to have BP 86/46, sodium 127, exposed pacer wires and and pus coming out of pacemaker pocket. Blood cultures 2/2 for MSSA.   Subjective: Patient reports no new complaints. No new problems reported to me   Assessment & Plan:   Principal Problem:   Sepsis, MSSA bacteremia and in UA (likely due to hematogenic spread) -  pacemaker infection with noted ulcer/ pus at site -  fevers / encephalopathic - repeat blood cultures still + likely he still has a pacer   - pacer was infected and removed. Patient has temporary pacer - TTE  Today > no endocarditis but has a mobile density on a pacer lead - Antibiotics per ID recommendations - Last blood culture pending but still negative (tomorrow should have finalized result). If blood cultures remain negative plan then can begin discharge planning. Cardiology team wants to wait 2 weeks prior to placing permanent pacemaker. Pt will need home health nursing to assist with dressing changes.   Active Problems:    Acute metabolic encephalopathy - resolving.    Paroxysmal atrial fibrillation (HCC) - Continue coreg. - Heparin for now due to procedures- resume Coumadin per cardiology    Hypotension with h/o Hypertension - Coreg- lower than home dose for now- holding Lisinopril    Hyponatremia, Acute renal failure superimposed on stage 3 chronic kidney disease (HCC) - baseline Cr 1.1 - admitted with Cr 3.4- likely prerenal and ATN in setting of ACE I and sepsis - steadily  improving - d/c IVF 9/23- appeared to be eating/drinking well and had good urine output- cont to follow - Cr improving- will reassess serum creatinine.  Metabolic acidosis - Resolving- likely from AKI and sepsis    Chronic systolic CHF (congestive heart failure) (HCC) - ECHO this admission: EF 35-40% with focal hypokinesis, grade 1 dCHF - not on chronic diuretics  Hypotension Resolved  Hyponatremia - Assess BMP next am.  Mildly elevated troponin - likely due to sepsis/ hypotension, no chest pain reported.    Tobacco abuse - will discuss cessation on discharge day.  DVT prophylaxis: Heparin Code Status: Full code Family Communication: none at bedside Disposition Plan:  Once blood culture remain negative will plan on picc line insertion and setting up home health.  Consultants:   Cardiology  ID  Procedures:  2 D ECHO Study Conclusions  - Left ventricle: The cavity size was mildly dilated. Systolic   function was moderately reduced. The estimated ejection fraction   was in the range of 35% to 40%. Diffuse hypokinesis. Dyskinesis   of the basal inferolateral myocardium. Akinesis of the basal   inferior myocardium. Doppler parameters are consistent with   abnormal left ventricular relaxation (grade 1 diastolic   dysfunction). - Aortic valve: Transvalvular velocity was within the normal range.   There was no stenosis. There was no regurgitation. - Mitral valve: Transvalvular velocity was within the normal range.   There was no evidence for stenosis. There was trivial   regurgitation. - Right ventricle: The cavity size was normal.  Wall thickness was   normal. Systolic function was mildly reduced. - Tricuspid valve: There was mild regurgitation. - Pulmonary arteries: Systolic pressure was within the normal   range. PA peak pressure: 27 mm Hg (S).  TEE small mobile echodensity on one of 3 pacer leads at the junction of SVC/RA that may represent small mobile thrombus  or vegetation. No other obvious lesions noted on pacer wires.    - EF 35-40%  - Mild MR  - Mild TR, myomatous/thickened tricuspid valve. No obvious vegetations.   - No obvious valvular vegetations  Antimicrobials:  Anti-infectives    Start     Dose/Rate Route Frequency Ordered Stop   01/25/17 1525  gentamicin (GARAMYCIN) 80 mg in sodium chloride irrigation 0.9 % 500 mL irrigation  Status:  Discontinued       As needed 01/25/17 1526 01/25/17 1614   01/25/17 1300  gentamicin (GARAMYCIN) 80 mg in sodium chloride irrigation 0.9 % 500 mL irrigation     80 mg Irrigation To Cath Lab 01/25/17 1240 01/25/17 1525   01/25/17 1300  ceFAZolin (ANCEF) IVPB 2g/100 mL premix     2 g 200 mL/hr over 30 Minutes Intravenous To Cath Lab 01/25/17 1240 01/25/17 1421   01/24/17 1030  gentamicin (GARAMYCIN) 80 mg in sodium chloride irrigation 0.9 % 500 mL irrigation  Status:  Discontinued     80 mg Irrigation To Cath Lab 01/24/17 0944 01/24/17 1451   01/24/17 0945  ceFAZolin (ANCEF) IVPB 2g/100 mL premix  Status:  Discontinued     2 g 200 mL/hr over 30 Minutes Intravenous On call 01/24/17 0944 01/24/17 1451   01/23/17 1600  ceFAZolin (ANCEF) IVPB 2g/100 mL premix     2 g 200 mL/hr over 30 Minutes Intravenous Every 8 hours 01/23/17 1331     01/22/17 1100  ceFAZolin (ANCEF) IVPB 2g/100 mL premix  Status:  Discontinued     2 g 200 mL/hr over 30 Minutes Intravenous Every 12 hours 01/22/17 1046 01/23/17 1331   01/22/17 0500  vancomycin (VANCOCIN) IVPB 750 mg/150 ml premix  Status:  Discontinued     750 mg 150 mL/hr over 60 Minutes Intravenous Every 24 hours 01/21/17 1107 01/22/17 1030   01/22/17 0000  piperacillin-tazobactam (ZOSYN) IVPB 2.25 g  Status:  Discontinued     2.25 g 100 mL/hr over 30 Minutes Intravenous Every 8 hours 01/21/17 2305 01/22/17 1030   01/21/17 1115  vancomycin (VANCOCIN) IVPB 1000 mg/200 mL premix     1,000 mg 200 mL/hr over 60 Minutes Intravenous  Once 01/21/17 1104 01/21/17 1257    01/21/17 1100  piperacillin-tazobactam (ZOSYN) IVPB 3.375 g     3.375 g 100 mL/hr over 30 Minutes Intravenous  Once 01/21/17 1051 01/21/17 1150       Objective: Vitals:   01/29/17 2128 01/30/17 0220 01/30/17 0616 01/30/17 1428  BP: 133/74 131/74 96/69 134/89  Pulse: 60 60 60 60  Resp: 16 (!) 23 19   Temp: 98.8 F (37.1 C) 97.9 F (36.6 C) 97.6 F (36.4 C)   TempSrc: Oral Oral Oral Oral  SpO2: 97% 100% 100% 97%  Weight:      Height:        Intake/Output Summary (Last 24 hours) at 01/30/17 1653 Last data filed at 01/30/17 1428  Gross per 24 hour  Intake             1380 ml  Output              725  ml  Net              655 ml   Filed Weights   01/27/17 0436 01/28/17 0500 01/29/17 0519  Weight: 68.3 kg (150 lb 8 oz) 67.3 kg (148 lb 6.4 oz) 66.5 kg (146 lb 8 oz)    Examination:Exam unchanged compared to 01/29/2017 General exam: Appears comfortable in nad. HEENT: PERRLA, oral mucosa moist, no sclera icterus or thrush Respiratory system: Clear to auscultation. Respiratory effort normal. Equal chest rise. Cardiovascular system: S1 & S2 heard, RRR.  No rubs or murmurs Gastrointestinal system: Abdomen soft, non-tender, nondistended. Normal bowel sound. No organomegaly Central nervous system:   No focal neurological deficits. Extremities: No cyanosis, clubbing or edema Skin:   Patient has gauze over left upper chest Psychiatry:  Difficult to assess as sleepy    Data Reviewed: I have personally reviewed following labs and imaging studies  CBC:  Recent Labs Lab 01/24/17 0605 01/25/17 0213 01/26/17 0535  WBC 15.1* 15.0* 14.1*  HGB 13.3 12.9* 11.9*  HCT 37.9* 36.5* 36.3*  MCV 81.0 81.5 84.2  PLT 160 209 340   Basic Metabolic Panel:  Recent Labs Lab 01/24/17 0605 01/26/17 0535 01/27/17 0543 01/28/17 0422  NA 133* 139 134* 134*  K 4.1 4.7 4.8 5.2*  CL 109 109 106 104  CO2 16* 20* 21* 22  GLUCOSE 149* 135* 128* 116*  BUN 61* 53* 43* 38*  CREATININE 1.88*  1.62* 1.41* 1.25*  CALCIUM 8.4* 8.6* 8.4* 9.1   GFR: Estimated Creatinine Clearance: 48 mL/min (A) (by C-G formula based on SCr of 1.25 mg/dL (H)). Liver Function Tests:  Recent Labs Lab 01/24/17 0605  AST 78*  ALT 29  ALKPHOS 47  BILITOT 0.9  PROT 5.8*  ALBUMIN 2.0*   No results for input(s): LIPASE, AMYLASE in the last 168 hours. No results for input(s): AMMONIA in the last 168 hours. Coagulation Profile:  Recent Labs Lab 01/26/17 0535 01/27/17 1020 01/28/17 0422 01/29/17 0531 01/30/17 0525  INR 3.10 3.03 1.61 1.42 1.34   Cardiac Enzymes: No results for input(s): CKTOTAL, CKMB, CKMBINDEX, TROPONINI in the last 168 hours. BNP (last 3 results) No results for input(s): PROBNP in the last 8760 hours. HbA1C: No results for input(s): HGBA1C in the last 72 hours. CBG:  Recent Labs Lab 01/26/17 0746 01/27/17 0727 01/28/17 0733 01/29/17 0737 01/30/17 0840  GLUCAP 169* 181* 135* 129* 106*   Lipid Profile: No results for input(s): CHOL, HDL, LDLCALC, TRIG, CHOLHDL, LDLDIRECT in the last 72 hours. Thyroid Function Tests: No results for input(s): TSH, T4TOTAL, FREET4, T3FREE, THYROIDAB in the last 72 hours. Anemia Panel: No results for input(s): VITAMINB12, FOLATE, FERRITIN, TIBC, IRON, RETICCTPCT in the last 72 hours. Urine analysis:    Component Value Date/Time   COLORURINE YELLOW 01/21/2017 1215   APPEARANCEUR CLOUDY (A) 01/21/2017 1215   LABSPEC 1.025 01/21/2017 1215   PHURINE 5.5 01/21/2017 1215   GLUCOSEU NEGATIVE 01/21/2017 1215   HGBUR LARGE (A) 01/21/2017 1215   BILIRUBINUR NEGATIVE 01/21/2017 1215   KETONESUR NEGATIVE 01/21/2017 1215   PROTEINUR >300 (A) 01/21/2017 1215   NITRITE NEGATIVE 01/21/2017 1215   LEUKOCYTESUR NEGATIVE 01/21/2017 1215   Sepsis Labs: (procalcitonin:4,lacticidven:4) ) Recent Results (from the past 240 hour(s))  Culture, blood (routine x 2)     Status: Abnormal   Collection Time: 01/21/17 10:50 AM  Result Value  Ref Range Status   Specimen Description BLOOD LEFT FOREARM  Final   Special Requests   Final  BOTTLES DRAWN AEROBIC AND ANAEROBIC Blood Culture adequate volume   Culture  Setup Time   Final    GRAM POSITIVE COCCI IN CLUSTERS IN BOTH AEROBIC AND ANAEROBIC BOTTLES CRITICAL VALUE NOTED.  VALUE IS CONSISTENT WITH PREVIOUSLY REPORTED AND CALLED VALUE.    Culture (A)  Final    STAPHYLOCOCCUS AUREUS SUSCEPTIBILITIES PERFORMED ON PREVIOUS CULTURE WITHIN THE LAST 5 DAYS. Performed at [facility], [address] [place], [place] [address]    Report Status 01/24/2017 FINAL  Final  Culture, blood (routine x 2)     Status: Abnormal   Collection Time: 01/21/17 11:08 AM  Result Value Ref Range Status   Specimen Description BLOOD LEFT WRIST  Final   Special Requests   Final    BOTTLES DRAWN AEROBIC AND ANAEROBIC Blood Culture adequate volume   Culture  Setup Time   Final    GRAM POSITIVE COCCI IN CLUSTERS IN BOTH AEROBIC AND ANAEROBIC BOTTLES CRITICAL RESULT CALLED TO, READ BACK BY AND VERIFIED WITH: K.COOK PHARMD 01/22/17 0408 L.CHAMPION Performed at [facility], [address], [place], [place] [address]    Culture STAPHYLOCOCCUS AUREUS (A)  Final   Report Status 01/24/2017 FINAL  Final   Organism ID, Bacteria STAPHYLOCOCCUS AUREUS  Final      Susceptibility   Staphylococcus aureus - MIC*    CIPROFLOXACIN <=0.5 SENSITIVE Sensitive     ERYTHROMYCIN <=0.25 SENSITIVE Sensitive     GENTAMICIN <=0.5 SENSITIVE Sensitive     OXACILLIN 0.5 SENSITIVE Sensitive     TETRACYCLINE <=1 SENSITIVE Sensitive     VANCOMYCIN <=0.5 SENSITIVE Sensitive     TRIMETH/SULFA <=10 SENSITIVE Sensitive     CLINDAMYCIN <=0.25 SENSITIVE Sensitive     RIFAMPIN <=0.5 SENSITIVE Sensitive     Inducible Clindamycin NEGATIVE Sensitive     * STAPHYLOCOCCUS AUREUS  Blood Culture ID Panel (Reflexed)     Status: Abnormal   Collection Time: 01/21/17 11:08 AM  Result Value Ref Range Status   Enterococcus  species NOT DETECTED NOT DETECTED Final   Vancomycin resistance NOT DETECTED NOT DETECTED Final   Listeria monocytogenes NOT DETECTED NOT DETECTED Final   Staphylococcus species DETECTED (A) NOT DETECTED Final    Comment: CRITICAL RESULT CALLED TO, READ BACK BY AND VERIFIED WITH: K.COOK PHARMD 01/22/17 0408 L.CHAMPION    Staphylococcus aureus DETECTED (A) NOT DETECTED Final    Comment: CRITICAL RESULT CALLED TO, READ BACK BY AND VERIFIED WITH: K.COOK PHARMD 01/22/17 0408 L.CHAMPION    Methicillin resistance NOT DETECTED NOT DETECTED Final   Streptococcus species NOT DETECTED NOT DETECTED Final   Streptococcus agalactiae NOT DETECTED NOT DETECTED Final   Streptococcus pneumoniae NOT DETECTED NOT DETECTED Final   Streptococcus pyogenes NOT DETECTED NOT DETECTED Final   Acinetobacter baumannii NOT DETECTED NOT DETECTED Final   Enterobacteriaceae species NOT DETECTED NOT DETECTED Final   Enterobacter cloacae complex NOT DETECTED NOT DETECTED Final   Escherichia coli NOT DETECTED NOT DETECTED Final   Klebsiella oxytoca NOT DETECTED NOT DETECTED Final   Klebsiella pneumoniae NOT DETECTED NOT DETECTED Final   Proteus species NOT DETECTED NOT DETECTED Final   Serratia marcescens NOT DETECTED NOT DETECTED Final   Carbapenem resistance NOT DETECTED NOT DETECTED Final   Haemophilus influenzae NOT DETECTED NOT DETECTED Final   Neisseria meningitidis NOT DETECTED NOT DETECTED Final   Pseudomonas aeruginosa NOT DETECTED NOT DETECTED Final   Candida albicans NOT DETECTED NOT DETECTED Final   Candida glabrata NOT DETECTED NOT DETECTED Final  Candida krusei NOT DETECTED NOT DETECTED Final   Candida parapsilosis NOT DETECTED NOT DETECTED Final   Candida tropicalis NOT DETECTED NOT DETECTED Final    Comment: Performed at [facility], [address] [place], [place] [address]  Urine culture     Status: Abnormal   Collection Time: 01/21/17 12:15 PM  Result Value Ref Range Status   Specimen  Description URINE, CLEAN CATCH  Final   Special Requests NONE  Final   Culture 70,000 COLONIES/mL STAPHYLOCOCCUS AUREUS (A)  Final   Report Status 01/23/2017 FINAL  Final   Organism ID, Bacteria STAPHYLOCOCCUS AUREUS (A)  Final      Susceptibility   Staphylococcus aureus - MIC*    CIPROFLOXACIN <=0.5 SENSITIVE Sensitive     GENTAMICIN <=0.5 SENSITIVE Sensitive     NITROFURANTOIN 32 SENSITIVE Sensitive     OXACILLIN <=0.25 SENSITIVE Sensitive     TETRACYCLINE <=1 SENSITIVE Sensitive     VANCOMYCIN <=0.5 SENSITIVE Sensitive     TRIMETH/SULFA <=10 SENSITIVE Sensitive     CLINDAMYCIN <=0.25 SENSITIVE Sensitive     RIFAMPIN <=0.5 SENSITIVE Sensitive     Inducible Clindamycin NEGATIVE Sensitive     * 70,000 COLONIES/mL STAPHYLOCOCCUS AUREUS  Surgical pcr screen     Status: Abnormal   Collection Time: 01/24/17  1:35 AM  Result Value Ref Range Status   MRSA, PCR NEGATIVE NEGATIVE Final   Staphylococcus aureus POSITIVE (A) NEGATIVE Final    Comment: (NOTE) The Xpert SA Assay (FDA approved for NASAL specimens in patients 86 years of age and older), is one component of a comprehensive surveillance program. It is not intended to diagnose infection nor to guide or monitor treatment.   Culture, blood (Routine X 2) w Reflex to ID Panel     Status: Abnormal   Collection Time: 01/24/17  9:45 AM  Result Value Ref Range Status   Specimen Description BLOOD RIGHT HAND  Final   Special Requests IN PEDIATRIC BOTTLE Blood Culture adequate volume  Final   Culture  Setup Time   Final    GRAM POSITIVE COCCI IN CLUSTERS IN PEDIATRIC BOTTLE CRITICAL RESULT CALLED TO, READ BACK BY AND VERIFIED WITH: L. BAJBUS PHARMD, AT 0748 01/25/17 BY D. VANHOOK    Culture STAPHYLOCOCCUS AUREUS (A)  Final   Report Status 01/27/2017 FINAL  Final   Organism ID, Bacteria STAPHYLOCOCCUS AUREUS  Final      Susceptibility   Staphylococcus aureus - MIC*    CIPROFLOXACIN <=0.5 SENSITIVE Sensitive     ERYTHROMYCIN <=0.25  SENSITIVE Sensitive     GENTAMICIN <=0.5 SENSITIVE Sensitive     OXACILLIN 0.5 SENSITIVE Sensitive     TETRACYCLINE <=1 SENSITIVE Sensitive     VANCOMYCIN <=0.5 SENSITIVE Sensitive     TRIMETH/SULFA <=10 SENSITIVE Sensitive     CLINDAMYCIN <=0.25 SENSITIVE Sensitive     RIFAMPIN <=0.5 SENSITIVE Sensitive     Inducible Clindamycin NEGATIVE Sensitive     * STAPHYLOCOCCUS AUREUS  Blood Culture ID Panel (Reflexed)     Status: Abnormal   Collection Time: 01/24/17  9:45 AM  Result Value Ref Range Status   Enterococcus species NOT DETECTED NOT DETECTED Final   Listeria monocytogenes NOT DETECTED NOT DETECTED Final   Staphylococcus species DETECTED (A) NOT DETECTED Final    Comment: CRITICAL RESULT CALLED TO, READ BACK BY AND VERIFIED WITH: L. BAJBUS PHARMD, AT 4098 01/25/17 BY D. VANHOOK    Staphylococcus aureus DETECTED (A) NOT DETECTED Final    Comment: Methicillin (  oxacillin) susceptible Staphylococcus aureus (MSSA). Preferred therapy is anti staphylococcal beta lactam antibiotic (Cefazolin or Nafcillin), unless clinically contraindicated. CRITICAL RESULT CALLED TO, READ BACK BY AND VERIFIED WITH: L. BAJBUS PHARMD, AT 1610 01/25/17 BY D. VANHOOK    Methicillin resistance NOT DETECTED NOT DETECTED Final   Streptococcus species NOT DETECTED NOT DETECTED Final   Streptococcus agalactiae NOT DETECTED NOT DETECTED Final   Streptococcus pneumoniae NOT DETECTED NOT DETECTED Final   Streptococcus pyogenes NOT DETECTED NOT DETECTED Final   Acinetobacter baumannii NOT DETECTED NOT DETECTED Final   Enterobacteriaceae species NOT DETECTED NOT DETECTED Final   Enterobacter cloacae complex NOT DETECTED NOT DETECTED Final   Escherichia coli NOT DETECTED NOT DETECTED Final   Klebsiella oxytoca NOT DETECTED NOT DETECTED Final   Klebsiella pneumoniae NOT DETECTED NOT DETECTED Final   Proteus species NOT DETECTED NOT DETECTED Final   Serratia marcescens NOT DETECTED NOT DETECTED Final   Haemophilus  influenzae NOT DETECTED NOT DETECTED Final   Neisseria meningitidis NOT DETECTED NOT DETECTED Final   Pseudomonas aeruginosa NOT DETECTED NOT DETECTED Final   Candida albicans NOT DETECTED NOT DETECTED Final   Candida glabrata NOT DETECTED NOT DETECTED Final   Candida krusei NOT DETECTED NOT DETECTED Final   Candida parapsilosis NOT DETECTED NOT DETECTED Final   Candida tropicalis NOT DETECTED NOT DETECTED Final  Culture, blood (Routine X 2) w Reflex to ID Panel     Status: None (Preliminary result)   Collection Time: 01/26/17  3:01 PM  Result Value Ref Range Status   Specimen Description BLOOD RIGHT HAND  Final   Special Requests IN PEDIATRIC BOTTLE Blood Culture adequate volume  Final   Culture NO GROWTH 4 DAYS  Final   Report Status PENDING  Incomplete         Radiology Studies: No results found.    Scheduled Meds: . carvedilol  3.125 mg Oral BID  . mupirocin ointment  1 application Nasal BID  . polyethylene glycol  17 g Oral Daily  . senna-docusate  1 tablet Oral BID  . sodium chloride flush  3 mL Intravenous Q12H   Continuous Infusions: . sodium chloride    .  ceFAZolin (ANCEF) IV Stopped (01/30/17 0534)     LOS: 9 days    Time spent in minutes: 35 min  Penny Pia, MD [facility] Pager: [email or web address] Password [email or web address] 01/30/2017, 4:53 PM

## 2017-01-30 NOTE — Progress Notes (Signed)
RN attempted to get patient to stand on scale for morning weight.  Patient stated we can do it later today.  RN asked patient if we could go ahead and try now patient replied in awhile.

## 2017-01-30 NOTE — Progress Notes (Signed)
Progress Note  Patient Name: Patrick Knox Date of Encounter: 01/30/2017  Primary Cardiologist: Patrick Knox  Subjective   Abdominal pain but no other complaints. 12 inches gauze removed.  Inpatient Medications    Scheduled Meds: . carvedilol  3.125 mg Oral BID  . mupirocin ointment  1 application Nasal BID  . senna-docusate  1 tablet Oral BID  . sodium chloride flush  3 mL Intravenous Q12H   Continuous Infusions: . sodium chloride    .  ceFAZolin (ANCEF) IV Stopped (01/30/17 0534)   PRN Meds: sodium chloride, [DISCONTINUED] acetaminophen **OR** acetaminophen, acetaminophen, HYDROcodone-acetaminophen, ondansetron (ZOFRAN) IV, sodium chloride flush   Vital Signs    Vitals:   01/29/17 1410 01/29/17 2128 01/30/17 0220 01/30/17 0616  BP: (!) 121/94 133/74 131/74 96/69  Pulse: 60 60 60 60  Resp: 17 16 (!) 23 19  Temp: 97.8 F (36.6 C) 98.8 F (37.1 C) 97.9 F (36.6 C) 97.6 F (36.4 C)  TempSrc: Axillary Oral Oral Oral  SpO2: 100% 97% 100% 100%  Weight:      Height:        Intake/Output Summary (Last 24 hours) at 01/30/17 1033 Last data filed at 01/30/17 0534  Gross per 24 hour  Intake             1480 ml  Output             1175 ml  Net              305 ml   Filed Weights   01/27/17 0436 01/28/17 0500 01/29/17 0519  Weight: 150 lb 8 oz (68.3 kg) 148 lb 6.4 oz (67.3 kg) 146 lb 8 oz (66.5 kg)    Telemetry    V pacing - Personally Reviewed  ECG    None new - Personally Reviewed  Physical Exam   GEN: Well nourished, well developed, in no acute distress  HEENT: normal  Neck: no JVD, carotid bruits, or masses Cardiac: RRR; no murmurs, rubs, or gallops,no edema  Respiratory:  clear to auscultation bilaterally, normal work of breathing GI: soft, nontender, nondistended, + BS MS: no deformity or atrophy  Skin: warm and dry, device site loosely closed with pus draining Neuro:  Strength and sensation are intact Psych: euthymic mood, full affect    Labs      Chemistry  Recent Labs Lab 01/24/17 0605 01/26/17 0535 01/27/17 0543 01/28/17 0422  NA 133* 139 134* 134*  K 4.1 4.7 4.8 5.2*  CL 109 109 106 104  CO2 16* 20* 21* 22  GLUCOSE 149* 135* 128* 116*  BUN 61* 53* 43* 38*  CREATININE 1.88* 1.62* 1.41* 1.25*  CALCIUM 8.4* 8.6* 8.4* 9.1  PROT 5.8*  --   --   --   ALBUMIN 2.0*  --   --   --   AST 78*  --   --   --   ALT 29  --   --   --   ALKPHOS 47  --   --   --   BILITOT 0.9  --   --   --   GFRNONAA 33* 40* 47* 55*  GFRAA 39* 46* 55* >60  ANIONGAP Hematology  Recent Labs Lab 01/24/17 0605 01/25/17 0213 01/26/17 0535  WBC 15.1* 15.0* 14.1*  RBC 4.68 4.48 4.31  HGB 13.3 12.9* 11.9*  HCT 37.9* 36.5* 36.3*  MCV 81.0 81.5 84.2  MCH 28.4 28.8 27.6  MCHC 35.1  35.3 32.8  RDW 15.5 16.1* 16.2*  PLT 160 209 340    Cardiac Enzymes No results for input(s): TROPONINI in the last 168 hours. No results for input(s): TROPIPOC in the last 168 hours.   BNP No results for input(s): BNP, PROBNP in the last 168 hours.   DDimer No results for input(s): DDIMER in the last 168 hours.   Radiology    Dg Chest 2 View Result Date: 01/26/2017 CLINICAL DATA:  Status post permanent pacemaker change out. EXAM: CHEST  2 VIEW COMPARISON:  Portable chest x-ray of January 21, 2017 FINDINGS: The previous pacemaker and electrodes have been removed. A single electrode has been placed with the tip in the right ventricular apex. The generator lies above the left clavicle. The lungs are adequately inflated. There is no postprocedure pneumothorax. There is stable scarring at the left lung base. The heart is top-normal in size. The pulmonary vascularity is normal. There is calcification in the wall of the aortic arch. The patient has undergone previous CABG. IMPRESSION: No postprocedure complication following permanent pacemaker change out. Previous CABG.  Thoracic aortic atherosclerosis. Electronically Signed   By: Patrick  Knox M.D.   On:  01/26/2017 07:25    Cardiac Studies    initial and second BC with staph, 3rd drawn yesterday post PPM extraction neg to date  Patient Profile     76 y.o. male admitted with altered mental status, growing out staph aureus now, with drainage from PM pocket. He was hypotensive and this has resolved. His renal function has improved.  Assessment & Plan    1. MSSA with PM pocket infection      Post CRT-P extraction. Continued removal of gauze from pocket.       Approximately 11-12inches of wic removed again from extraction site again today, patient tolerated well Plan is to remove 12" or so every day until all has been removed, remains with some brownish colored drainage, if patient is to be discharged prior to complete removal of packing, Patrick Knox need wound care RN to complete      VSS, clinically stable.   2. CHB      Temp perm through right IJ   3. Chronic systolic heart failure       Well compensated    4. Paroxysmal Afib     CHA2DS2Vasc is 4     Continue off anticoagulation with acute issues, in sinus rhythm  5. CAD (remote CABG)     No angina  6. Back pain     Per IM  7. Abdominal pain: Patrick Knox try miralax as had not had bowel movement in a few days   For questions or updates, please contact [facility] Please consult [email or web address] for contact info under Cardiology/STEMI.        Signed, Patrick Vecchiarelli Patrick Loa, MD  01/30/2017, 10:33 AM  Patient ID: Patrick Knox, male   DOB: 1940/07/16, 76 y.o.   MRN: [id number]

## 2017-01-31 LAB — BASIC METABOLIC PANEL
Anion gap: 7 (ref 5–15)
BUN: 31 mg/dL — ABNORMAL HIGH (ref 6–20)
CHLORIDE: 97 mmol/L — AB (ref 101–111)
CO2: 26 mmol/L (ref 22–32)
Calcium: 8.7 mg/dL — ABNORMAL LOW (ref 8.9–10.3)
Creatinine, Ser: 1.14 mg/dL (ref 0.61–1.24)
GFR calc non Af Amer: >60 mL/min (ref >60)
Glucose, Bld: 127 mg/dL — ABNORMAL HIGH (ref 65–99)
POTASSIUM: 4.9 mmol/L (ref 3.5–5.1)
SODIUM: 130 mmol/L — AB (ref 135–145)

## 2017-01-31 LAB — PROTIME-INR
INR: 1.32
PROTHROMBIN TIME: 16.3 s — AB (ref 11.4–15.2)

## 2017-01-31 LAB — CULTURE, BLOOD (ROUTINE X 2)
Culture: NO GROWTH
SPECIAL REQUESTS: ADEQUATE

## 2017-01-31 LAB — GLUCOSE, CAPILLARY
GLUCOSE-CAPILLARY: 217 mg/dL — AB (ref 65–99)
Glucose-Capillary: 117 mg/dL — ABNORMAL HIGH (ref 65–99)

## 2017-01-31 MED ORDER — POTASSIUM CHLORIDE CRYS ER 20 MEQ PO TBCR: 40.0000 meq | EXTENDED_RELEASE_TABLET | Freq: Once | ORAL | Status: DC

## 2017-01-31 NOTE — Progress Notes (Signed)
Electrophysiology Rounding Note  Patient Name: Patrick Knox Date of Encounter: 01/31/2017  Primary Cardiologist: Jens Som Electrophysiologist: Elberta Fortis   Subjective   The patient is doing ok today. Still with back pain. No fevers or chills.   Inpatient Medications    Scheduled Meds: . carvedilol  3.125 mg Oral BID  . polyethylene glycol  17 g Oral Daily  . senna-docusate  1 tablet Oral BID  . sodium chloride flush  3 mL Intravenous Q12H   Continuous Infusions: . sodium chloride    .  ceFAZolin (ANCEF) IV Stopped (01/31/17 0700)   PRN Meds: sodium chloride, [DISCONTINUED] acetaminophen **OR** acetaminophen, acetaminophen, bisacodyl, HYDROcodone-acetaminophen, ondansetron (ZOFRAN) IV, sodium chloride flush   Vital Signs    Vitals:   01/30/17 1428 01/30/17 2057 01/31/17 0001 01/31/17 0413  BP: 134/89 137/69 (!) 155/75 118/72  Pulse: 60 60 60   Resp:  (!) 29 (!) 25   Temp:   (!) 97.3 F (36.3 C) 98.2 F (36.8 C)  TempSrc: Oral  Oral Oral  SpO2: 97% 97% 98%   Weight:    147 lb 8 oz (66.9 kg)  Height:        Intake/Output Summary (Last 24 hours) at 01/31/17 0957 Last data filed at 01/31/17 0859  Gross per 24 hour  Intake              580 ml  Output              250 ml  Net              330 ml   Filed Weights   01/28/17 0500 01/29/17 0519 01/31/17 0413  Weight: 148 lb 6.4 oz (67.3 kg) 146 lb 8 oz (66.5 kg) 147 lb 8 oz (66.9 kg)    Physical Exam    GEN- The patient is elderly and chronically ill appearing, alert and oriented x 3 today.   Head- normocephalic, atraumatic Eyes-  Sclera clear, conjunctiva pink Ears- hearing intact Oropharynx- clear Neck- supple, temp perm pacemaker with dressing in place, left chest dressing with 12" of wick removed today  Lungs- Clear to ausculation bilaterally, normal work of breathing Heart- Regular rate and rhythm (paced) GI- soft, NT, ND, + BS Extremities- no clubbing, cyanosis, or edema Skin- no rash or lesion Psych-  euthymic mood, full affect Neuro- strength and sensation are intact  Labs    Basic Metabolic Panel  Recent Labs  01/31/17 0332  NA 130*  K 4.9  CL 97*  CO2 26  GLUCOSE 127*  BUN 31*  CREATININE 1.14  CALCIUM 8.7*    Telemetry    V pacing with AV dissociation (personally reviewed)  Radiology    No results found.   Patient Profile     Patrick Knox is a 76 y.o. male admitted with AMS, found to have exposed pacemaker leads and staph bacteremia. S/p PPM system extraction and insertion of temp/perm PPM 01/25/17.   Assessment & Plan    1.  MSSA S/p PPM system extraction and insertion of tem perm pacemaker Will need wick to continue to be removed daily (12" at a time, done today) Keep dry dressing over extraction site Wound check scheduled for suture removal in our office 10/11, APP appt 10/18 to discuss device reimplant  2.  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  Chronic systolic heart failure Stable No change required today  Ok from EP standpoint to discharge to home with early outpatient  follow up (as above).  Dr Graciela Husbands to see later today.   Signed, [name], NP  01/31/2017, 9:57 AM   As above Plan as outlined Ok for discharge once [facility] are confirmed negative --done  Today

## 2017-01-31 NOTE — Progress Notes (Signed)
PT Cancellation Note  Patient Details Name: Malone Vanblarcom MRN: [id number] DOB: 1940-10-23   Cancelled Treatment:    Reason Eval/Treat Not Completed: Fatigue/lethargy limiting ability to participate; attempted tx earlier today.  Patient just back to bed and refused mobility rest of today.  Will attempt another day.   Elray Mcgregor 01/31/2017, 3:54 PM  Sheran Lawless, PT [phone] 01/31/2017

## 2017-01-31 NOTE — Care Management Important Message (Signed)
Important Message  Patient Details  Name: Patrick Knox MRN: [id number] Date of Birth: [birth year]   Medicare Important Message Given:  Yes    [name] 01/31/2017, 10:05 AM

## 2017-01-31 NOTE — Discharge Instructions (Signed)
Do not raise left arm  No showers! Do not get neck/chest wet

## 2017-01-31 NOTE — Progress Notes (Signed)
PROGRESS NOTE    Patrick Knox   ZHY:[id number]  DOB: 01/11/1941  DOA: 01/21/2017 PCP: Kirby Funk, MD   Brief Narrative:  Patrick Knox 76 y.o. male with PPM, history of CAD with CABG, HTN, sCHF, A-fib on Coumadin, complete heart block with pacemaker, CKD 3, tobacco abuse presents from home with confusion and weakness. Apparently also started Flexeril recently. Prior to coming in to the hospital, per wife, he was very active and volunteers with the church etc.  Found to have BP 86/46, sodium 127, exposed pacer wires and and pus coming out of pacemaker pocket. Blood cultures 2/2 for MSSA.   Subjective: No acute issues reported overnight to me by patient.   Assessment & Plan:   Principal Problem:   Sepsis, MSSA bacteremia and in UA (likely due to hematogenic spread) -  pacemaker infection with noted ulcer/ pus at site -  fevers / encephalopathic - repeat blood cultures still + likely he still has a pacer   - pacer was infected and removed. Patient has temporary pacer - TTE  Today > no endocarditis but has a mobile density on a pacer lead - Antibiotics per ID recommendations - Cardiology team wants to wait 2 weeks prior to placing permanent pacemaker. Pt will need home health nursing to assist with dressing changes.  - Will place orders to have Picc line inserted to start discharge planning  Active Problems:    Acute metabolic encephalopathy - resolving.    Paroxysmal atrial fibrillation (HCC) - Continue coreg. - Heparin for now due to procedures- resume Coumadin per cardiology    Hypotension with h/o Hypertension - Coreg- lower than home dose for now- holding Lisinopril    Hyponatremia, Acute renal failure superimposed on stage 3 chronic kidney disease (HCC) - baseline Cr 1.1 - admitted with Cr 3.4- likely prerenal and ATN in setting of ACE I and sepsis - steadily improving - d/c IVF 9/23- appeared to be eating/drinking well and had good urine output- cont to  follow - Cr improving- will reassess serum creatinine.  Metabolic acidosis - Resolved, likely from AKI and sepsis    Chronic systolic CHF (congestive heart failure) (HCC) - ECHO this admission: EF 35-40% with focal hypokinesis, grade 1 dCHF - not on chronic diuretics  Hypotension Resolved  Hyponatremia - stable, mild most likely due to poor oral solute intake.  Mildly elevated troponin - likely due to sepsis/ hypotension, no chest pain reported.    Tobacco abuse - will discuss cessation on discharge day.  DVT prophylaxis: Heparin Code Status: Full code Family Communication: none at bedside Disposition Plan:  Place picc line and then set up home health. D/c next am.  Consultants:   Cardiology  ID  Procedures:  2 D ECHO Study Conclusions  - Left ventricle: The cavity size was mildly dilated. Systolic   function was moderately reduced. The estimated ejection fraction   was in the range of 35% to 40%. Diffuse hypokinesis. Dyskinesis   of the basal inferolateral myocardium. Akinesis of the basal   inferior myocardium. Doppler parameters are consistent with   abnormal left ventricular relaxation (grade 1 diastolic   dysfunction). - Aortic valve: Transvalvular velocity was within the normal range.   There was no stenosis. There was no regurgitation. - Mitral valve: Transvalvular velocity was within the normal range.   There was no evidence for stenosis. There was trivial   regurgitation. - Right ventricle: The cavity size was normal. Wall thickness was   normal. Systolic function was mildly  reduced. - Tricuspid valve: There was mild regurgitation. - Pulmonary arteries: Systolic pressure was within the normal   range. PA peak pressure: 27 mm Hg (S).  TEE small mobile echodensity on one of 3 pacer leads at the junction of SVC/RA that may represent small mobile thrombus or vegetation. No other obvious lesions noted on pacer wires.    - EF 35-40%  - Mild MR  - Mild  TR, myomatous/thickened tricuspid valve. No obvious vegetations.   - No obvious valvular vegetations  Antimicrobials:  Anti-infectives    Start     Dose/Rate Route Frequency Ordered Stop   01/25/17 1525  gentamicin (GARAMYCIN) 80 mg in sodium chloride irrigation 0.9 % 500 mL irrigation  Status:  Discontinued       As needed 01/25/17 1526 01/25/17 1614   01/25/17 1300  gentamicin (GARAMYCIN) 80 mg in sodium chloride irrigation 0.9 % 500 mL irrigation     80 mg Irrigation To Cath Lab 01/25/17 1240 01/25/17 1525   01/25/17 1300  ceFAZolin (ANCEF) IVPB 2g/100 mL premix     2 g 200 mL/hr over 30 Minutes Intravenous To Cath Lab 01/25/17 1240 01/25/17 1421   01/24/17 1030  gentamicin (GARAMYCIN) 80 mg in sodium chloride irrigation 0.9 % 500 mL irrigation  Status:  Discontinued     80 mg Irrigation To Cath Lab 01/24/17 0944 01/24/17 1451   01/24/17 0945  ceFAZolin (ANCEF) IVPB 2g/100 mL premix  Status:  Discontinued     2 g 200 mL/hr over 30 Minutes Intravenous On call 01/24/17 0944 01/24/17 1451   01/23/17 1600  ceFAZolin (ANCEF) IVPB 2g/100 mL premix     2 g 200 mL/hr over 30 Minutes Intravenous Every 8 hours 01/23/17 1331     01/22/17 1100  ceFAZolin (ANCEF) IVPB 2g/100 mL premix  Status:  Discontinued     2 g 200 mL/hr over 30 Minutes Intravenous Every 12 hours 01/22/17 1046 01/23/17 1331   01/22/17 0500  vancomycin (VANCOCIN) IVPB 750 mg/150 ml premix  Status:  Discontinued     750 mg 150 mL/hr over 60 Minutes Intravenous Every 24 hours 01/21/17 1107 01/22/17 1030   01/22/17 0000  piperacillin-tazobactam (ZOSYN) IVPB 2.25 g  Status:  Discontinued     2.25 g 100 mL/hr over 30 Minutes Intravenous Every 8 hours 01/21/17 2305 01/22/17 1030   01/21/17 1115  vancomycin (VANCOCIN) IVPB 1000 mg/200 mL premix     1,000 mg 200 mL/hr over 60 Minutes Intravenous  Once 01/21/17 1104 01/21/17 1257   01/21/17 1100  piperacillin-tazobactam (ZOSYN) IVPB 3.375 g     3.375 g 100 mL/hr over 30 Minutes  Intravenous  Once 01/21/17 1051 01/21/17 1150       Objective: Vitals:   01/30/17 2057 01/31/17 0001 01/31/17 0413 01/31/17 1406  BP: 137/69 (!) 155/75 118/72 (!) 100/59  Pulse: 60 60  (!) 58  Resp: (!) 29 (!) 25  15  Temp:  (!) 97.3 F (36.3 C) 98.2 F (36.8 C) 98.3 F (36.8 C)  TempSrc:  Oral Oral Oral  SpO2: 97% 98%  100%  Weight:   66.9 kg (147 lb 8 oz)   Height:        Intake/Output Summary (Last 24 hours) at 01/31/17 1536 Last data filed at 01/31/17 1400  Gross per 24 hour  Intake              800 ml  Output  250 ml  Net              550 ml   Filed Weights   01/28/17 0500 01/29/17 0519 01/31/17 0413  Weight: 67.3 kg (148 lb 6.4 oz) 66.5 kg (146 lb 8 oz) 66.9 kg (147 lb 8 oz)    Examination:Exam unchanged compared to 01/30/2017 General exam: Appears comfortable in nad. HEENT: PERRLA, oral mucosa moist, no sclera icterus or thrush Respiratory system: Clear to auscultation. Respiratory effort normal. Equal chest rise. Cardiovascular system: S1 & S2 heard, RRR.  No rubs or murmurs Gastrointestinal system: Abdomen soft, non-tender, nondistended. Normal bowel sound. No organomegaly Central nervous system:   No focal neurological deficits. Extremities: No cyanosis, clubbing or edema Skin:   Patient has gauze over left upper chest Psychiatry:  Difficult to assess as sleepy    Data Reviewed: I have personally reviewed following labs and imaging studies  CBC:  Recent Labs Lab 01/25/17 0213 01/26/17 0535  WBC 15.0* 14.1*  HGB 12.9* 11.9*  HCT 36.5* 36.3*  MCV 81.5 84.2  PLT 209 340   Basic Metabolic Panel:  Recent Labs Lab 01/26/17 0535 01/27/17 0543 01/28/17 0422 01/31/17 0332  NA 139 134* 134* 130*  K 4.7 4.8 5.2* 4.9  CL 109 106 104 97*  CO2 20* 21* 22 26  GLUCOSE 135* 128* 116* 127*  BUN 53* 43* 38* 31*  CREATININE 1.62* 1.41* 1.25* 1.14  CALCIUM 8.6* 8.4* 9.1 8.7*   GFR: Estimated Creatinine Clearance: 53 mL/min (by C-G formula  based on SCr of 1.14 mg/dL). Liver Function Tests: No results for input(s): AST, ALT, ALKPHOS, BILITOT, PROT, ALBUMIN in the last 168 hours. No results for input(s): LIPASE, AMYLASE in the last 168 hours. No results for input(s): AMMONIA in the last 168 hours. Coagulation Profile:  Recent Labs Lab 01/27/17 1020 01/28/17 0422 01/29/17 0531 01/30/17 0525 01/31/17 0332  INR 3.03 1.61 1.42 1.34 1.32   Cardiac Enzymes: No results for input(s): CKTOTAL, CKMB, CKMBINDEX, TROPONINI in the last 168 hours. BNP (last 3 results) No results for input(s): PROBNP in the last 8760 hours. HbA1C: No results for input(s): HGBA1C in the last 72 hours. CBG:  Recent Labs Lab 01/27/17 0727 01/28/17 0733 01/29/17 0737 01/30/17 0840 01/31/17 0736  GLUCAP 181* 135* 129* 106* 117*   Lipid Profile: No results for input(s): CHOL, HDL, LDLCALC, TRIG, CHOLHDL, LDLDIRECT in the last 72 hours. Thyroid Function Tests: No results for input(s): TSH, T4TOTAL, FREET4, T3FREE, THYROIDAB in the last 72 hours. Anemia Panel: No results for input(s): VITAMINB12, FOLATE, FERRITIN, TIBC, IRON, RETICCTPCT in the last 72 hours. Urine analysis:    Component Value Date/Time   COLORURINE YELLOW 01/21/2017 1215   APPEARANCEUR CLOUDY (A) 01/21/2017 1215   LABSPEC 1.025 01/21/2017 1215   PHURINE 5.5 01/21/2017 1215   GLUCOSEU NEGATIVE 01/21/2017 1215   HGBUR LARGE (A) 01/21/2017 1215   BILIRUBINUR NEGATIVE 01/21/2017 1215   KETONESUR NEGATIVE 01/21/2017 1215   PROTEINUR >300 (A) 01/21/2017 1215   NITRITE NEGATIVE 01/21/2017 1215   LEUKOCYTESUR NEGATIVE 01/21/2017 1215   Sepsis Labs: @LABRCNTIP (procalcitonin:4,lacticidven:4) ) Recent Results (from the past 240 hour(s))  Surgical pcr screen     Status: Abnormal   Collection Time: 01/24/17  1:35 AM  Result Value Ref Range Status   MRSA, PCR NEGATIVE NEGATIVE Final   Staphylococcus aureus POSITIVE (A) NEGATIVE Final    Comment: (NOTE) The Xpert SA Assay (FDA  approved for NASAL specimens in patients 45 years of age and older), is  one component of a comprehensive surveillance program. It is not intended to diagnose infection nor to guide or monitor treatment.   Culture, blood (Routine X 2) w Reflex to ID Panel     Status: Abnormal   Collection Time: 01/24/17  9:45 AM  Result Value Ref Range Status   Specimen Description BLOOD RIGHT HAND  Final   Special Requests IN PEDIATRIC BOTTLE Blood Culture adequate volume  Final   Culture  Setup Time   Final    GRAM POSITIVE COCCI IN CLUSTERS IN PEDIATRIC BOTTLE CRITICAL RESULT CALLED TO, READ BACK BY AND VERIFIED WITH: L. BAJBUS PHARMD, AT 0748 01/25/17 BY D. VANHOOK    Culture STAPHYLOCOCCUS AUREUS (A)  Final   Report Status 01/27/2017 FINAL  Final   Organism ID, Bacteria STAPHYLOCOCCUS AUREUS  Final      Susceptibility   Staphylococcus aureus - MIC*    CIPROFLOXACIN <=0.5 SENSITIVE Sensitive     ERYTHROMYCIN <=0.25 SENSITIVE Sensitive     GENTAMICIN <=0.5 SENSITIVE Sensitive     OXACILLIN 0.5 SENSITIVE Sensitive     TETRACYCLINE <=1 SENSITIVE Sensitive     VANCOMYCIN <=0.5 SENSITIVE Sensitive     TRIMETH/SULFA <=10 SENSITIVE Sensitive     CLINDAMYCIN <=0.25 SENSITIVE Sensitive     RIFAMPIN <=0.5 SENSITIVE Sensitive     Inducible Clindamycin NEGATIVE Sensitive     * STAPHYLOCOCCUS AUREUS  Blood Culture ID Panel (Reflexed)     Status: Abnormal   Collection Time: 01/24/17  9:45 AM  Result Value Ref Range Status   Enterococcus species NOT DETECTED NOT DETECTED Final   Listeria monocytogenes NOT DETECTED NOT DETECTED Final   Staphylococcus species DETECTED (A) NOT DETECTED Final    Comment: CRITICAL RESULT CALLED TO, READ BACK BY AND VERIFIED WITH: L. BAJBUS PHARMD, AT 1610 01/25/17 BY D. VANHOOK    Staphylococcus aureus DETECTED (A) NOT DETECTED Final    Comment: Methicillin (oxacillin) susceptible Staphylococcus aureus (MSSA). Preferred therapy is anti staphylococcal beta lactam antibiotic  (Cefazolin or Nafcillin), unless clinically contraindicated. CRITICAL RESULT CALLED TO, READ BACK BY AND VERIFIED WITH: L. BAJBUS PHARMD, AT 9604 01/25/17 BY D. VANHOOK    Methicillin resistance NOT DETECTED NOT DETECTED Final   Streptococcus species NOT DETECTED NOT DETECTED Final   Streptococcus agalactiae NOT DETECTED NOT DETECTED Final   Streptococcus pneumoniae NOT DETECTED NOT DETECTED Final   Streptococcus pyogenes NOT DETECTED NOT DETECTED Final   Acinetobacter baumannii NOT DETECTED NOT DETECTED Final   Enterobacteriaceae species NOT DETECTED NOT DETECTED Final   Enterobacter cloacae complex NOT DETECTED NOT DETECTED Final   Escherichia coli NOT DETECTED NOT DETECTED Final   Klebsiella oxytoca NOT DETECTED NOT DETECTED Final   Klebsiella pneumoniae NOT DETECTED NOT DETECTED Final   Proteus species NOT DETECTED NOT DETECTED Final   Serratia marcescens NOT DETECTED NOT DETECTED Final   Haemophilus influenzae NOT DETECTED NOT DETECTED Final   Neisseria meningitidis NOT DETECTED NOT DETECTED Final   Pseudomonas aeruginosa NOT DETECTED NOT DETECTED Final   Candida albicans NOT DETECTED NOT DETECTED Final   Candida glabrata NOT DETECTED NOT DETECTED Final   Candida krusei NOT DETECTED NOT DETECTED Final   Candida parapsilosis NOT DETECTED NOT DETECTED Final   Candida tropicalis NOT DETECTED NOT DETECTED Final  Culture, blood (Routine X 2) w Reflex to ID Panel     Status: None   Collection Time: 01/26/17  3:01 PM  Result Value Ref Range Status   Specimen Description BLOOD RIGHT HAND  Final  Special Requests IN PEDIATRIC BOTTLE Blood Culture adequate volume  Final   Culture NO GROWTH 5 DAYS  Final   Report Status 01/31/2017 FINAL  Final     Radiology Studies: No results found.  Scheduled Meds: . carvedilol  3.125 mg Oral BID  . polyethylene glycol  17 g Oral Daily  . senna-docusate  1 tablet Oral BID  . sodium chloride flush  3 mL Intravenous Q12H   Continuous  Infusions: . sodium chloride    .  ceFAZolin (ANCEF) IV Stopped (01/31/17 0700)     LOS: 10 days    Time spent in minutes: 35 min  Penny Pia, MD [facility] Pager: [email or web address] Password [email or web address] 01/31/2017, 3:36 PM

## 2017-01-31 NOTE — Progress Notes (Signed)
Occupational Therapy Treatment Patient Details Name: Patrick Knox MRN: [id number] DOB: Nov 02, 1940 Today's Date: 01/31/2017    History of present illness Pt is 76 yo male with known HTN, HL, sCHF, EF 35%, a-fib on Coumadin, complete heart block and s/p pacemaker, CAD, s/p CABG, tobacco use, CKD stage III, presented with lethargy and weakness, poor oral intake, progressively worsening confusion. 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  Pt agreed to OOB and to some tasks in standing to increase standing tolerance and independence with adls in standing. Pt fatigues quickly and asks to sit frequently, but with encouragement was able to do much more.  Feel pt will need SNF before returning home to ensure safety when on his feet and to increase initiation/follow through with his own self care tasks.  Follow Up Recommendations  SNF;Supervision/Assistance - 24 hour    Equipment Recommendations       Recommendations for Other Services      Precautions / Restrictions Precautions Precautions: Fall Precaution Comments: telemetry Restrictions Weight Bearing Restrictions: No       Mobility Bed Mobility Overal bed mobility: Needs Assistance Bed Mobility: Supine to Sit     Supine to sit: Min assist (extra time)     General bed mobility comments: Pt took appx 8 minutes to get to the side of the bed but only needed VCs to roll to his side first and min physical assist to boost onto elbow in sidelying.    Transfers Overall transfer level: Needs assistance Equipment used: Rolling walker (2 wheeled) Transfers: Sit to/from [employer] Sit to Stand: From elevated surface;Min assist Stand pivot transfers: Min assist       General transfer comment: Pt moves slowly and deliberately but was safe during transfer.  Pt asked to sit immediately. Worked on standing tasks for appx 3 min before  sitting.    Balance Overall balance assessment: Needs assistance Sitting-balance support: Feet supported Sitting balance-Leahy Scale: Fair Sitting balance - Comments: Pt sat EOB for 5 minutes with S.   Standing balance support: Bilateral upper extremity supported;During functional activity Standing balance-Leahy Scale: Fair Standing balance comment: Pt did not need a lot of physical assist to stand but could not tolerate standing very long before asking to sit.                           ADL either performed or assessed with clinical judgement   ADL Overall ADL's : Needs assistance/impaired Eating/Feeding: Set up;Supervision/ safety;Sitting Eating/Feeding Details (indicate cue type and reason): Pt agreed to chair Grooming: Wash/dry face;Wash/dry hands;Oral care;Set up;Sitting Grooming Details (indicate cue type and reason): Pt requires extra time for all tasks.                 Toilet Transfer: Minimal assistance;BSC;RW;Stand-pivot Statistician Details (indicate cue type and reason): Pt started from elevated surface.  Moved very slowly but did not require as much physical assist to mobilize. Toileting- Clothing Manipulation and Hygiene: Sit to/from stand;Cueing for sequencing;Maximal assistance Toileting - Clothing Manipulation Details (indicate cue type and reason): Pt could maintain balance with a walker while therapist cleaned pt.     Functional mobility during ADLs: Minimal assistance;Rolling walker General ADL Comments: Pt requires a lot of encouragement to participate. Once pt does, he does well but cannot tolerate much activity without significant rest breaks.     Vision  Vision Assessment?: Vision impaired- to be further tested in functional context Additional Comments: Pt has glasses.   Perception     Praxis      Cognition Arousal/Alertness: Lethargic Behavior During Therapy: Flat affect Overall Cognitive Status: Impaired/Different from  baseline Area of Impairment: Attention;Awareness                   Current Attention Level: Selective     Safety/Judgement: Decreased awareness of safety;Decreased awareness of deficits Awareness: Emergent Problem Solving: Slow processing;Decreased initiation;Requires verbal cues General Comments: Pt continues to have eyes closed through a good part of session.  Pt with very slow processing and initiation.        Exercises     Shoulder Instructions       General Comments Pt fatigued but when encouraged does well.    Pertinent Vitals/ Pain       Pain Assessment: 0-10 Pain Score: 8  Pain Location: stomach pain and back pain Pain Descriptors / Indicators: Aching;Sore Pain Intervention(s): Limited activity within patient's tolerance;Monitored during session;Repositioned  Home Living                                          Prior Functioning/Environment              Frequency  Min 2X/week        Progress Toward Goals  OT Goals(current goals can now be found in the care plan section)  Progress towards OT goals: Progressing toward goals  Acute Rehab OT Goals Patient Stated Goal: to go home OT Goal Formulation: With patient Time For Goal Achievement: 02/07/17 Potential to Achieve Goals: Good ADL Goals Pt Will Perform Grooming: with min assist;standing Pt Will Perform Upper Body Bathing: with set-up;with supervision;sitting Pt Will Perform Lower Body Bathing: with mod assist Pt Will Perform Upper Body Dressing: with min assist;sitting Pt Will Perform Lower Body Dressing: with mod assist Pt Will Transfer to Toilet: with min assist;ambulating;bedside commode Pt Will Perform Toileting - Clothing Manipulation and hygiene: with mod assist Additional ADL Goal #1: Pt will be min A in and OOB for basic ADLs  Plan Discharge plan remains appropriate    Co-evaluation                 AM-PAC PT "6 Clicks" Daily Activity     Outcome  Measure   Help from another person eating meals?: None Help from another person taking care of personal grooming?: A Little Help from another person toileting, which includes using toliet, bedpan, or urinal?: A Lot Help from another person bathing (including washing, rinsing, drying)?: A Lot Help from another person to put on and taking off regular upper body clothing?: A Lot Help from another person to put on and taking off regular lower body clothing?: A Lot 6 Click Score: 15    End of Session Equipment Utilized During Treatment: Rolling walker  OT Visit Diagnosis: Unsteadiness on feet (R26.81);Other abnormalities of gait and mobility (R26.89);Muscle weakness (generalized) (M62.81);Pain Pain - part of body:  (abdomen)   Activity Tolerance Patient limited by fatigue;Patient limited by lethargy   Patient Left in chair;with call bell/phone within reach   Nurse Communication Mobility status        Time: 1610-9604 OT Time Calculation (min): 30 min  Charges: OT General Charges $OT Visit: 1 Visit OT Treatments $Self Care/Home Management : 23-37 mins  [facility]  Assunta Curtis 01/31/2017, 9:32 AM

## 2017-01-31 NOTE — Progress Notes (Signed)
CSW met with patient and family at bedside. Patient and family prefer a SNF in or near [place]. All facilities they preferred have declined to offer a bed. CSW provided complete bed offer list. Patient and family to make decision and CSW will follow up. Family requesting CSW to re-check with [facility] in [place] when patient closer to discharge to determine if a bed has become available. CSW encouraged patient and family to choose an alternate in the likely event [facility] continues to not have bed available. CSW to follow.  Susan Porter, LCSWA [phone]  

## 2017-02-01 ENCOUNTER — Inpatient Hospital Stay (HOSPITAL_COMMUNITY): Payer: Medicare PPO

## 2017-02-01 ENCOUNTER — Ambulatory Visit: Payer: Medicare PPO

## 2017-02-01 LAB — PROTIME-INR
INR: 1.26
PROTHROMBIN TIME: 15.7 s — AB (ref 11.4–15.2)

## 2017-02-01 LAB — GLUCOSE, CAPILLARY: Glucose-Capillary: 130 mg/dL — ABNORMAL HIGH (ref 65–99)

## 2017-02-01 MED ORDER — MINERAL OIL RE ENEM
1.0000 | ENEMA | Freq: Once | RECTAL | Status: AC
  Administered 2017-02-01: 1 via RECTAL
  Filled 2017-02-01: qty 1

## 2017-02-01 MED ORDER — SODIUM CHLORIDE 0.9% FLUSH: 10.0000 mL | INTRAVENOUS | Status: DC | PRN

## 2017-02-01 NOTE — Progress Notes (Signed)
CSW obtained SNF choice from family - [facility]. CSW started authorization request with [facility] and is awaiting approval.   [name], LCSWA [phone]

## 2017-02-01 NOTE — Progress Notes (Signed)
PHARMACY CONSULT NOTE FOR:  OUTPATIENT  PARENTERAL ANTIBIOTIC THERAPY (OPAT)  Indication: MSSA bacteremia Regimen: Cefazolin 2 grams iv Q 8 hours End date: March 09, 2017  IV antibiotic discharge orders are pended. To discharging provider:  please sign these orders via discharge navigator,  Select New Orders & click on the button choice - Manage This Unsigned Work.     Thank you for allowing pharmacy to be a part of this patient's care. Okey Regal, PharmD [phone]  02/01/2017, 8:19 AM

## 2017-02-01 NOTE — Progress Notes (Signed)
PROGRESS NOTE    Patrick Knox   ION:[id number]  DOB: 06-16-1940  DOA: 01/21/2017 PCP: Kirby Funk, MD   Brief Narrative:  Patrick Knox 76 y.o. male with PPM, history of CAD with CABG, HTN, sCHF, A-fib on Coumadin, complete heart block with pacemaker, CKD 3, tobacco abuse presents from home with confusion and weakness. Apparently also started Flexeril recently. Prior to coming in to the hospital, per wife, he was very active and volunteers with the church etc.  Found to have BP 86/46, sodium 127, exposed pacer wires and and pus coming out of pacemaker pocket. Blood cultures 2/2 for MSSA.  Patient had permanent pacemaker removed and has temporary pacemaker placed. Seen by EP specialist would like to see patient 2 weeks after discharge for consideration of permanent pacemaker insertion at that point. Patient had TEE which reported no endocarditis. ID consulted and has been assisting with antibiotic regimen  Hospital stay complicated by complaints of back discomfort patient reportedly has not had BM despite laxatives for more than 3 days. Reports discomfort  Subjective: No acute issues reported overnight to me by patient.   Assessment & Plan:   Principal Problem:   Sepsis, MSSA bacteremia and in UA (likely due to hematogenic spread) -  pacemaker infection with noted ulcer/ pus at site -  fevers / encephalopathic - repeat blood cultures still + likely he still has a pacer   - pacer was infected and removed. Patient has temporary pacer - TTE  Today > no endocarditis but has a mobile density on a pacer lead -Infectious disease specialist recommends 6 weeks of Ancef. Has placed OPAT orders. Ancef 2 g IV every 8 hours for 6 weeks with weekly labs CBC, CMP faxed to infectious disease clinic - Cardiology team wants to wait 2 weeks prior to placing permanent pacemaker. Pt will need home health nursing to assist with dressing changes.  - PICC line order placed  Back discomfort - Patient  was examined and no pain over spinous processes (therefore no order placed for x ray) or paraspinal muscles question whether this is regarding constipation and will place order for mineral oil enema.  Active Problems:    Acute metabolic encephalopathy - resolved    Paroxysmal atrial fibrillation (HCC) - Continue coreg. - Per last note on 01/30/2017 cardiology recommending continuing off anticoagulation with acute issues, in sinus rhythm    Hypotension with h/o Hypertension - Coreg- lower than home dose for now- holding Lisinopril    Hyponatremia, Acute renal failure superimposed on stage 3 chronic kidney disease (HCC) - baseline Cr 1.1 - Patient back at baseline   Metabolic acidosis - Resolved, likely from AKI and sepsis    Chronic systolic CHF (congestive heart failure) (HCC) - ECHO this admission: EF 35-40% with focal hypokinesis, grade 1 dCHF - not on chronic diuretics  Hypotension Resolved  Hyponatremia - stable, mild most likely due to poor oral solute intake.  Mildly elevated troponin - likely due to sepsis/ hypotension, no chest pain reported.    Tobacco abuse - will discuss cessation on discharge day.  DVT prophylaxis: Heparin Code Status: Full code Family Communication: none at bedside Disposition Plan: PICC line placed for patient complaining of discomfort secondary to not having any bowel movements. Will provide mineral oil enema and monitor. With improvement in abdominal discomfort we'll plan on discharging next 1 or 2 days  Consultants:   Cardiology  ID  Procedures:  2 D ECHO Study Conclusions  - Left ventricle: The cavity size was  mildly dilated. Systolic   function was moderately reduced. The estimated ejection fraction   was in the range of 35% to 40%. Diffuse hypokinesis. Dyskinesis   of the basal inferolateral myocardium. Akinesis of the basal   inferior myocardium. Doppler parameters are consistent with   abnormal left ventricular  relaxation (grade 1 diastolic   dysfunction). - Aortic valve: Transvalvular velocity was within the normal range.   There was no stenosis. There was no regurgitation. - Mitral valve: Transvalvular velocity was within the normal range.   There was no evidence for stenosis. There was trivial   regurgitation. - Right ventricle: The cavity size was normal. Wall thickness was   normal. Systolic function was mildly reduced. - Tricuspid valve: There was mild regurgitation. - Pulmonary arteries: Systolic pressure was within the normal   range. PA peak pressure: 27 mm Hg (S).  TEE small mobile echodensity on one of 3 pacer leads at the junction of SVC/RA that may represent small mobile thrombus or vegetation. No other obvious lesions noted on pacer wires.    - EF 35-40%  - Mild MR  - Mild TR, myomatous/thickened tricuspid valve. No obvious vegetations.   - No obvious valvular vegetations  Antimicrobials:  Anti-infectives    Start     Dose/Rate Route Frequency Ordered Stop   01/25/17 1525  gentamicin (GARAMYCIN) 80 mg in sodium chloride irrigation 0.9 % 500 mL irrigation  Status:  Discontinued       As needed 01/25/17 1526 01/25/17 1614   01/25/17 1300  gentamicin (GARAMYCIN) 80 mg in sodium chloride irrigation 0.9 % 500 mL irrigation     80 mg Irrigation To Cath Lab 01/25/17 1240 01/25/17 1525   01/25/17 1300  ceFAZolin (ANCEF) IVPB 2g/100 mL premix     2 g 200 mL/hr over 30 Minutes Intravenous To Cath Lab 01/25/17 1240 01/25/17 1421   01/24/17 1030  gentamicin (GARAMYCIN) 80 mg in sodium chloride irrigation 0.9 % 500 mL irrigation  Status:  Discontinued     80 mg Irrigation To Cath Lab 01/24/17 0944 01/24/17 1451   01/24/17 0945  ceFAZolin (ANCEF) IVPB 2g/100 mL premix  Status:  Discontinued     2 g 200 mL/hr over 30 Minutes Intravenous On call 01/24/17 0944 01/24/17 1451   01/23/17 1600  ceFAZolin (ANCEF) IVPB 2g/100 mL premix     2 g 200 mL/hr over 30 Minutes Intravenous Every 8  hours 01/23/17 1331 03/09/17 2359   01/22/17 1100  ceFAZolin (ANCEF) IVPB 2g/100 mL premix  Status:  Discontinued     2 g 200 mL/hr over 30 Minutes Intravenous Every 12 hours 01/22/17 1046 01/23/17 1331   01/22/17 0500  vancomycin (VANCOCIN) IVPB 750 mg/150 ml premix  Status:  Discontinued     750 mg 150 mL/hr over 60 Minutes Intravenous Every 24 hours 01/21/17 1107 01/22/17 1030   01/22/17 0000  piperacillin-tazobactam (ZOSYN) IVPB 2.25 g  Status:  Discontinued     2.25 g 100 mL/hr over 30 Minutes Intravenous Every 8 hours 01/21/17 2305 01/22/17 1030   01/21/17 1115  vancomycin (VANCOCIN) IVPB 1000 mg/200 mL premix     1,000 mg 200 mL/hr over 60 Minutes Intravenous  Once 01/21/17 1104 01/21/17 1257   01/21/17 1100  piperacillin-tazobactam (ZOSYN) IVPB 3.375 g     3.375 g 100 mL/hr over 30 Minutes Intravenous  Once 01/21/17 1051 01/21/17 1150       Objective: Vitals:   01/31/17 1940 01/31/17 2300 02/01/17 0428 02/01/17 1429  BP: Marland Kitchen)  124/54  117/71 125/72  Pulse: (!) 59   (!) 59  Resp: (!) 22  16 (!) 33  Temp: 98.6 F (37 C)  98.4 F (36.9 C) 98.3 F (36.8 C)  TempSrc: Axillary  Oral Oral  SpO2: 100% 98% 99% 97%  Weight:   66.4 kg (146 lb 6.2 oz)   Height:        Intake/Output Summary (Last 24 hours) at 02/01/17 1836 Last data filed at 02/01/17 1700  Gross per 24 hour  Intake              660 ml  Output              650 ml  Net               10 ml   Filed Weights   01/29/17 0519 01/31/17 0413 02/01/17 0428  Weight: 66.5 kg (146 lb 8 oz) 66.9 kg (147 lb 8 oz) 66.4 kg (146 lb 6.2 oz)    Examination: General exam: Awake and alert, in no acute distress, looks uncomfortable HEENT: PERRLA, oral mucosa moist, no sclera icterus or thrush Respiratory system: Clear to auscultation. Respiratory effort normal. Equal chest rise. Cardiovascular system: S1 & S2 heard, RRR.  No rubs or murmurs Gastrointestinal system: Abdomen soft, non-tender, nondistended. Normal bowel sound. No  organomegaly Central nervous system:   No focal neurological deficits.Examination is spinous process and the back do not reveal any discomfort on palpation Extremities: No cyanosis, clubbing or edema Skin:   Patient has gauze over left upper chest, no paraspinal muscle tenderness Psychiatry:  Difficult to assess as sleepy    Data Reviewed: I have personally reviewed following labs and imaging studies  CBC:  Recent Labs Lab 01/26/17 0535  WBC 14.1*  HGB 11.9*  HCT 36.3*  MCV 84.2  PLT 340   Basic Metabolic Panel:  Recent Labs Lab 01/26/17 0535 01/27/17 0543 01/28/17 0422 01/31/17 0332  NA 139 134* 134* 130*  K 4.7 4.8 5.2* 4.9  CL 109 106 104 97*  CO2 20* 21* 22 26  GLUCOSE 135* 128* 116* 127*  BUN 53* 43* 38* 31*  CREATININE 1.62* 1.41* 1.25* 1.14  CALCIUM 8.6* 8.4* 9.1 8.7*   GFR: Estimated Creatinine Clearance: 52.6 mL/min (by C-G formula based on SCr of 1.14 mg/dL). Liver Function Tests: No results for input(s): AST, ALT, ALKPHOS, BILITOT, PROT, ALBUMIN in the last 168 hours. No results for input(s): LIPASE, AMYLASE in the last 168 hours. No results for input(s): AMMONIA in the last 168 hours. Coagulation Profile:  Recent Labs Lab 01/28/17 0422 01/29/17 0531 01/30/17 0525 01/31/17 0332 02/01/17 0412  INR 1.61 1.42 1.34 1.32 1.26   Cardiac Enzymes: No results for input(s): CKTOTAL, CKMB, CKMBINDEX, TROPONINI in the last 168 hours. BNP (last 3 results) No results for input(s): PROBNP in the last 8760 hours. HbA1C: No results for input(s): HGBA1C in the last 72 hours. CBG:  Recent Labs Lab 01/29/17 0737 01/30/17 0840 01/31/17 0736 01/31/17 2159 02/01/17 0725  GLUCAP 129* 106* 117* 217* 130*   Lipid Profile: No results for input(s): CHOL, HDL, LDLCALC, TRIG, CHOLHDL, LDLDIRECT in the last 72 hours. Thyroid Function Tests: No results for input(s): TSH, T4TOTAL, FREET4, T3FREE, THYROIDAB in the last 72 hours. Anemia Panel: No results for  input(s): VITAMINB12, FOLATE, FERRITIN, TIBC, IRON, RETICCTPCT in the last 72 hours. Urine analysis:    Component Value Date/Time   COLORURINE YELLOW 01/21/2017 1215   APPEARANCEUR CLOUDY (A) 01/21/2017 1215  LABSPEC 1.025 01/21/2017 1215   PHURINE 5.5 01/21/2017 1215   GLUCOSEU NEGATIVE 01/21/2017 1215   HGBUR LARGE (A) 01/21/2017 1215   BILIRUBINUR NEGATIVE 01/21/2017 1215   KETONESUR NEGATIVE 01/21/2017 1215   PROTEINUR >300 (A) 01/21/2017 1215   NITRITE NEGATIVE 01/21/2017 1215   LEUKOCYTESUR NEGATIVE 01/21/2017 1215   Sepsis Labs: (procalcitonin:4,lacticidven:4) ) Recent Results (from the past 240 hour(s))  Surgical pcr screen     Status: Abnormal   Collection Time: 01/24/17  1:35 AM  Result Value Ref Range Status   MRSA, PCR NEGATIVE NEGATIVE Final   Staphylococcus aureus POSITIVE (A) NEGATIVE Final    Comment: (NOTE) The Xpert SA Assay (FDA approved for NASAL specimens in patients 75 years of age and older), is one component of a comprehensive surveillance program. It is not intended to diagnose infection nor to guide or monitor treatment.   Culture, blood (Routine X 2) w Reflex to ID Panel     Status: Abnormal   Collection Time: 01/24/17  9:45 AM  Result Value Ref Range Status   Specimen Description BLOOD RIGHT HAND  Final   Special Requests IN PEDIATRIC BOTTLE Blood Culture adequate volume  Final   Culture  Setup Time   Final    GRAM POSITIVE COCCI IN CLUSTERS IN PEDIATRIC BOTTLE CRITICAL RESULT CALLED TO, READ BACK BY AND VERIFIED WITH: L. BAJBUS PHARMD, AT 0748 01/25/17 BY D. VANHOOK    Culture STAPHYLOCOCCUS AUREUS (A)  Final   Report Status 01/27/2017 FINAL  Final   Organism ID, Bacteria STAPHYLOCOCCUS AUREUS  Final      Susceptibility   Staphylococcus aureus - MIC*    CIPROFLOXACIN <=0.5 SENSITIVE Sensitive     ERYTHROMYCIN <=0.25 SENSITIVE Sensitive     GENTAMICIN <=0.5 SENSITIVE Sensitive     OXACILLIN 0.5 SENSITIVE Sensitive      TETRACYCLINE <=1 SENSITIVE Sensitive     VANCOMYCIN <=0.5 SENSITIVE Sensitive     TRIMETH/SULFA <=10 SENSITIVE Sensitive     CLINDAMYCIN <=0.25 SENSITIVE Sensitive     RIFAMPIN <=0.5 SENSITIVE Sensitive     Inducible Clindamycin NEGATIVE Sensitive     * STAPHYLOCOCCUS AUREUS  Blood Culture ID Panel (Reflexed)     Status: Abnormal   Collection Time: 01/24/17  9:45 AM  Result Value Ref Range Status   Enterococcus species NOT DETECTED NOT DETECTED Final   Listeria monocytogenes NOT DETECTED NOT DETECTED Final   Staphylococcus species DETECTED (A) NOT DETECTED Final    Comment: CRITICAL RESULT CALLED TO, READ BACK BY AND VERIFIED WITH: L. BAJBUS PHARMD, AT 7673 01/25/17 BY D. VANHOOK    Staphylococcus aureus DETECTED (A) NOT DETECTED Final    Comment: Methicillin (oxacillin) susceptible Staphylococcus aureus (MSSA). Preferred therapy is anti staphylococcal beta lactam antibiotic (Cefazolin or Nafcillin), unless clinically contraindicated. CRITICAL RESULT CALLED TO, READ BACK BY AND VERIFIED WITH: L. BAJBUS PHARMD, AT 4193 01/25/17 BY D. VANHOOK    Methicillin resistance NOT DETECTED NOT DETECTED Final   Streptococcus species NOT DETECTED NOT DETECTED Final   Streptococcus agalactiae NOT DETECTED NOT DETECTED Final   Streptococcus pneumoniae NOT DETECTED NOT DETECTED Final   Streptococcus pyogenes NOT DETECTED NOT DETECTED Final   Acinetobacter baumannii NOT DETECTED NOT DETECTED Final   Enterobacteriaceae species NOT DETECTED NOT DETECTED Final   Enterobacter cloacae complex NOT DETECTED NOT DETECTED Final   Escherichia coli NOT DETECTED NOT DETECTED Final   Klebsiella oxytoca NOT DETECTED NOT DETECTED Final   Klebsiella pneumoniae NOT DETECTED NOT DETECTED Final   Proteus  species NOT DETECTED NOT DETECTED Final   Serratia marcescens NOT DETECTED NOT DETECTED Final   Haemophilus influenzae NOT DETECTED NOT DETECTED Final   Neisseria meningitidis NOT DETECTED NOT DETECTED Final    Pseudomonas aeruginosa NOT DETECTED NOT DETECTED Final   Candida albicans NOT DETECTED NOT DETECTED Final   Candida glabrata NOT DETECTED NOT DETECTED Final   Candida krusei NOT DETECTED NOT DETECTED Final   Candida parapsilosis NOT DETECTED NOT DETECTED Final   Candida tropicalis NOT DETECTED NOT DETECTED Final  Culture, blood (Routine X 2) w Reflex to ID Panel     Status: None   Collection Time: 01/26/17  3:01 PM  Result Value Ref Range Status   Specimen Description BLOOD RIGHT HAND  Final   Special Requests IN PEDIATRIC BOTTLE Blood Culture adequate volume  Final   Culture NO GROWTH 5 DAYS  Final   Report Status 01/31/2017 FINAL  Final     Radiology Studies: Dg Chest Port 1 View  Result Date: 02/01/2017 CLINICAL DATA:  PICC line placement. EXAM: PORTABLE CHEST 1 VIEW COMPARISON:  01/26/2017 FINDINGS: The cardiac silhouette, mediastinal and hilar contours are within normal limits and stable. Stable left IJ right ventricular pacer wire. New right-sided PICC line tip is in the distal SVC. No complicating features. Patchy E right upper lobe airspace process suspicious for pneumonia. Persistent streaky left basilar subsegmental atelectasis. No pleural effusion or pneumothorax. IMPRESSION: 1. Right PICC line tip in good position without complicating features. 2. Patchy right upper lobe airspace process, suspicious for pneumonia. 3. Persistent streaky left basilar atelectasis and/or scarring. Electronically Signed   By: Rudie Meyer M.D.   On: 02/01/2017 10:30    Scheduled Meds: . carvedilol  3.125 mg Oral BID  . polyethylene glycol  17 g Oral Daily  . senna-docusate  1 tablet Oral BID  . sodium chloride flush  3 mL Intravenous Q12H   Continuous Infusions: . sodium chloride    .  ceFAZolin (ANCEF) IV Stopped (02/01/17 1530)     LOS: 11 days    Time spent in minutes: 35 min  Penny Pia, MD [facility] Pager: [email or web address] Password [facility] 02/01/2017, 6:36 PM

## 2017-02-01 NOTE — Progress Notes (Signed)
Notified MD Cena Benton about continued constipation with not BM since 01/26/17. Previous interventions have been unsuccessful including stool softeners and laxatives. Enema ordered and given. No BM at this time. Will continue to monitor the patient closely.   Sheppard Evens RN

## 2017-02-01 NOTE — Progress Notes (Signed)
Physical Therapy Treatment Patient Details Name: Patrick Knox MRN: [id number] DOB: [birth year] Today's Date: 02/01/2017    History of Present Illness Pt is 76 yo male with known HTN, HL, sCHF, EF 35%, a-fib on Coumadin, complete heart block and s/p pacemaker, CAD, s/p CABG, tobacco use, CKD stage III, presented with lethargy and weakness, poor oral intake, progressively worsening confusion. 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  PT provided extensive education on goals of therapy, and detrimental effects of remaining bedfast including skin breakdown, pneumonia, and further decreased mobility.  Pt ultimately agreeable to sit EOB but refused any further mobility and requested to lie back down after about 2 minutes.  Pt positioned in R side lying with heat applied to back for pain control.  Per CT report, no fxs.     Follow Up Recommendations  SNF     Equipment Recommendations  None recommended by PT (to be determined at next venue)    Recommendations for Other Services       Precautions / Restrictions Precautions Precautions: Fall Precaution Comments: severe back pain Restrictions Weight Bearing Restrictions: No    Mobility  Bed Mobility Overal bed mobility: Needs Assistance Bed Mobility: Supine to Sit     Supine to sit: Min assist        Transfers                 General transfer comment: refused  Ambulation/Gait                 Stairs            Wheelchair Mobility    Modified Rankin (Stroke Patients Only)       Balance                                            Cognition Arousal/Alertness: Lethargic Behavior During Therapy: Flat affect Overall Cognitive Status: Impaired/Different from baseline                                 General Comments: Requires  max encouragement to even sit EOB, keeps eyes closed throughout majority of session      Exercises      General Comments        Pertinent Vitals/Pain Pain Assessment: Faces Faces Pain Scale: Hurts even more Pain Location: back Pain Descriptors / Indicators: Aching;Sore Pain Intervention(s): Limited activity within patient's tolerance;Monitored during session;Repositioned;Heat applied    Home Living                      Prior Function            PT Goals (current goals can now be found in the care plan section) Acute Rehab PT Goals Patient Stated Goal: to not do therapy PT Goal Formulation: With patient Time For Goal Achievement: 02/07/17 Potential to Achieve Goals: Fair Progress towards PT goals: Not progressing toward goals - comment (pt significantly limited by pain in abdomen and back today)    Frequency    Min 2X/week      PT Plan Current plan remains appropriate    Co-evaluation  AM-PAC PT "6 Clicks" Daily Activity  Outcome Measure  Difficulty turning over in bed (including adjusting bedclothes, sheets and blankets)?: A Lot Difficulty moving from lying on back to sitting on the side of the bed? : A Lot Difficulty sitting down on and standing up from a chair with arms (e.g., wheelchair, bedside commode, etc,.)?: A Lot Help needed moving to and from a bed to chair (including a wheelchair)?: Total Help needed walking in hospital room?: Total Help needed climbing 3-5 steps with a railing? : Total 6 Click Score: 9    End of Session   Activity Tolerance: Patient limited by pain;Patient limited by fatigue Patient left: in bed;with call bell/phone within reach;with family/visitor present Nurse Communication: Mobility status PT Visit Diagnosis: Unsteadiness on feet (R26.81);Other abnormalities of gait and mobility (R26.89);Muscle weakness (generalized) (M62.81)     Time: 7829-5621 PT Time Calculation (min) (ACUTE ONLY): 27  min  Charges:  $Therapeutic Activity: 23-37 mins                    G Codes:          Stephania Fragmin 02/01/2017, 12:26 PM

## 2017-02-01 NOTE — Progress Notes (Signed)
CSW has discussed SNF bed offers with patient and family x3. Patient and family have not yet made a bed choice as of this morning, and CSW unable to start patient's [facility] authorization. Family advised of need to start authorization. CSW to obtain bed choice and start authorization today.  [name], LCSWA [phone]

## 2017-02-01 NOTE — Progress Notes (Signed)
Peripherally Inserted Central Catheter/Midline Placement  The IV Nurse has discussed with the patient and/or persons authorized to consent for the patient, the purpose of this procedure and the potential benefits and risks involved with this procedure.  The benefits include less needle sticks, lab draws from the catheter, and the patient may be discharged home with the catheter. Risks include, but not limited to, infection, bleeding, blood clot (thrombus formation), and puncture of an artery; nerve damage and irregular heartbeat and possibility to perform a PICC exchange if needed/ordered by physician.  Alternatives to this procedure were also discussed.  Bard Power PICC patient education guide, fact sheet on infection prevention and patient information card has been provided to patient /or left at bedside.    PICC/Midline Placement Documentation  PICC Single Lumen 02/01/17 PICC Right Brachial 0 cm (Active)  Indication for Insertion or Continuance of Line Home intravenous therapies (PICC only) 02/01/2017  9:00 AM  Exposed Catheter (cm) 0 cm 02/01/2017  9:00 AM  Dressing Change Due 02/08/17 02/01/2017  9:00 AM    Consent done by Arlina Robes RN   Dewain Penning M 02/01/2017, 10:00 AM

## 2017-02-02 LAB — PROTIME-INR
INR: 1.31
Prothrombin Time: 16.2 seconds — ABNORMAL HIGH (ref 11.4–15.2)

## 2017-02-02 LAB — GLUCOSE, CAPILLARY: Glucose-Capillary: 116 mg/dL — ABNORMAL HIGH (ref 65–99)

## 2017-02-02 NOTE — Progress Notes (Signed)
PROGRESS NOTE    Patrick Knox  ZOX:[id number] DOB: 1940-05-14 DOA: 01/21/2017 PCP: Kirby Funk, MD    Brief Narrative: 76 y.o.malewith PPM, history of CAD with CABG, HTN, sCHF, A-fib on Coumadin, complete heart block with pacemaker, CKD 3, tobacco abuse presents from home with confusion and weakness. Apparently also started Flexeril recently. Prior to coming in to the hospital, per wife, he was very active and volunteers with the church etc.  Found to have BP 86/46, sodium 127, exposed pacer wires and and pus coming out of pacemaker pocket. Blood cultures 2/2 for MSSA.  Patient had permanent pacemaker removed and has temporary pacemaker placed. Seen by EP specialist would like to see patient 2 weeks after discharge for consideration of permanent pacemaker insertion at that point. Patient had TEE which reported no endocarditis. ID consulted and has been assisting with antibiotic regimen    Assessment & Plan:   Principal Problem:   Sepsis (HCC) Active Problems:   Hypertension   High cholesterol   Paroxysmal atrial fibrillation (HCC)   Acute renal failure superimposed on stage 3 chronic kidney disease (HCC)   Chronic systolic CHF (congestive heart failure) (HCC)   Hypotension   Acute metabolic encephalopathy   Hyponatremia   Elevated troponin   Tobacco abuse   Cardiac device in situ   Pacemaker infection (HCC)    Sepsis, MSSA bacteremia and in UA (likely due to hematogenic spread) -  pacemaker infection with noted ulcer/ pus at site -  fevers / encephalopathic - repeat blood cultures still + likely he still has a pacer   - pacer was infected and removed. Patient has temporary pacer - TTE   no endocarditis but has a mobile density on a pacer lead -Infectious disease specialist recommends 6 weeks of Ancef. Has placed OPAT orders. Ancef 2 g IV every 8 hours for 6 weeks with weekly labs CBC, CMP faxed to infectious disease clinic - Cardiology team wants to wait 2 weeks prior  to placing permanent pacemaker. Pt will need home health nursing to assist with dressing changes.  - PICC line order placed    DVT prophylaxis: heparin Code Status:full Family Communication Disposition Plan: snf is not accepting patient due to temporary pacemaker.   Consultants: card Procedures:     Antimicrobials:    Subjective:   Objective: Vitals:   02/01/17 1429 02/01/17 2035 02/02/17 0447 02/02/17 1341  BP: 125/72 132/77 120/71 115/65  Pulse: (!) 59 60 60 (!) 59  Resp: (!) 33 20 18 (!) 27  Temp: 98.3 F (36.8 C) 98.8 F (37.1 C) 98.8 F (37.1 C) 98.3 F (36.8 C)  TempSrc: Oral Oral Oral Oral  SpO2: 97% 97% 99% 97%  Weight:   66.6 kg (146 lb 13.2 oz)   Height:        Intake/Output Summary (Last 24 hours) at 02/02/17 1527 Last data filed at 02/02/17 1300  Gross per 24 hour  Intake              762 ml  Output             2125 ml  Net            -1363 ml   Filed Weights   01/31/17 0413 02/01/17 0428 02/02/17 0447  Weight: 66.9 kg (147 lb 8 oz) 66.4 kg (146 lb 6.2 oz) 66.6 kg (146 lb 13.2 oz)    Examination:  General exam: Appears calm and comfortable  Respiratory system: Clear to auscultation. Respiratory effort normal.  Cardiovascular system: S1 & S2 heard, RRR. No JVD, murmurs, rubs, gallops or clicks. No pedal edema. Gastrointestinal system: Abdomen is nondistended, soft and nontender. No organomegaly or masses felt. Normal bowel sounds heard. Central nervous system: Alert and oriented. No focal neurological deficits. Extremities: Symmetric 5 x 5 power. Skin: No rashes, lesions or ulcers Psychiatry: Judgement and insight appear normal. Mood & affect appropriate.     Data Reviewed: I have personally reviewed following labs and imaging studies  CBC: No results for input(s): WBC, NEUTROABS, HGB, HCT, MCV, PLT in the last 168 hours. Basic Metabolic Panel:  Recent Labs Lab 01/27/17 0543 01/28/17 0422 01/31/17 0332  NA 134* 134* 130*  K 4.8  5.2* 4.9  CL 106 104 97*  CO2 21* 22 26  GLUCOSE 128* 116* 127*  BUN 43* 38* 31*  CREATININE 1.41* 1.25* 1.14  CALCIUM 8.4* 9.1 8.7*   GFR: Estimated Creatinine Clearance: 52.7 mL/min (by C-G formula based on SCr of 1.14 mg/dL). Liver Function Tests: No results for input(s): AST, ALT, ALKPHOS, BILITOT, PROT, ALBUMIN in the last 168 hours. No results for input(s): LIPASE, AMYLASE in the last 168 hours. No results for input(s): AMMONIA in the last 168 hours. Coagulation Profile:  Recent Labs Lab 01/29/17 0531 01/30/17 0525 01/31/17 0332 02/01/17 0412 02/02/17 0403  INR 1.42 1.34 1.32 1.26 1.31   Cardiac Enzymes: No results for input(s): CKTOTAL, CKMB, CKMBINDEX, TROPONINI in the last 168 hours. BNP (last 3 results) No results for input(s): PROBNP in the last 8760 hours. HbA1C: No results for input(s): HGBA1C in the last 72 hours. CBG:  Recent Labs Lab 01/30/17 0840 01/31/17 0736 01/31/17 2159 02/01/17 0725 02/02/17 0753  GLUCAP 106* 117* 217* 130* 116*   Lipid Profile: No results for input(s): CHOL, HDL, LDLCALC, TRIG, CHOLHDL, LDLDIRECT in the last 72 hours. Thyroid Function Tests: No results for input(s): TSH, T4TOTAL, FREET4, T3FREE, THYROIDAB in the last 72 hours. Anemia Panel: No results for input(s): VITAMINB12, FOLATE, FERRITIN, TIBC, IRON, RETICCTPCT in the last 72 hours. Sepsis Labs: No results for input(s): PROCALCITON, LATICACIDVEN in the last 168 hours.  Recent Results (from the past 240 hour(s))  Surgical pcr screen     Status: Abnormal   Collection Time: 01/24/17  1:35 AM  Result Value Ref Range Status   MRSA, PCR NEGATIVE NEGATIVE Final   Staphylococcus aureus POSITIVE (A) NEGATIVE Final    Comment: (NOTE) The Xpert SA Assay (FDA approved for NASAL specimens in patients 39 years of age and older), is one component of a comprehensive surveillance program. It is not intended to diagnose infection nor to guide or monitor treatment.   Culture,  blood (Routine X 2) w Reflex to ID Panel     Status: Abnormal   Collection Time: 01/24/17  9:45 AM  Result Value Ref Range Status   Specimen Description BLOOD RIGHT HAND  Final   Special Requests IN PEDIATRIC BOTTLE Blood Culture adequate volume  Final   Culture  Setup Time   Final    GRAM POSITIVE COCCI IN CLUSTERS IN PEDIATRIC BOTTLE CRITICAL RESULT CALLED TO, READ BACK BY AND VERIFIED WITH: L. BAJBUS PHARMD, AT 0748 01/25/17 BY D. VANHOOK    Culture STAPHYLOCOCCUS AUREUS (A)  Final   Report Status 01/27/2017 FINAL  Final   Organism ID, Bacteria STAPHYLOCOCCUS AUREUS  Final      Susceptibility   Staphylococcus aureus - MIC*    CIPROFLOXACIN <=0.5 SENSITIVE Sensitive     ERYTHROMYCIN <=0.25 SENSITIVE Sensitive  GENTAMICIN <=0.5 SENSITIVE Sensitive     OXACILLIN 0.5 SENSITIVE Sensitive     TETRACYCLINE <=1 SENSITIVE Sensitive     VANCOMYCIN <=0.5 SENSITIVE Sensitive     TRIMETH/SULFA <=10 SENSITIVE Sensitive     CLINDAMYCIN <=0.25 SENSITIVE Sensitive     RIFAMPIN <=0.5 SENSITIVE Sensitive     Inducible Clindamycin NEGATIVE Sensitive     * STAPHYLOCOCCUS AUREUS  Blood Culture ID Panel (Reflexed)     Status: Abnormal   Collection Time: 01/24/17  9:45 AM  Result Value Ref Range Status   Enterococcus species NOT DETECTED NOT DETECTED Final   Listeria monocytogenes NOT DETECTED NOT DETECTED Final   Staphylococcus species DETECTED (A) NOT DETECTED Final    Comment: CRITICAL RESULT CALLED TO, READ BACK BY AND VERIFIED WITH: L. BAJBUS PHARMD, AT 1610 01/25/17 BY D. VANHOOK    Staphylococcus aureus DETECTED (A) NOT DETECTED Final    Comment: Methicillin (oxacillin) susceptible Staphylococcus aureus (MSSA). Preferred therapy is anti staphylococcal beta lactam antibiotic (Cefazolin or Nafcillin), unless clinically contraindicated. CRITICAL RESULT CALLED TO, READ BACK BY AND VERIFIED WITH: L. BAJBUS PHARMD, AT 9604 01/25/17 BY D. VANHOOK    Methicillin resistance NOT DETECTED NOT DETECTED  Final   Streptococcus species NOT DETECTED NOT DETECTED Final   Streptococcus agalactiae NOT DETECTED NOT DETECTED Final   Streptococcus pneumoniae NOT DETECTED NOT DETECTED Final   Streptococcus pyogenes NOT DETECTED NOT DETECTED Final   Acinetobacter baumannii NOT DETECTED NOT DETECTED Final   Enterobacteriaceae species NOT DETECTED NOT DETECTED Final   Enterobacter cloacae complex NOT DETECTED NOT DETECTED Final   Escherichia coli NOT DETECTED NOT DETECTED Final   Klebsiella oxytoca NOT DETECTED NOT DETECTED Final   Klebsiella pneumoniae NOT DETECTED NOT DETECTED Final   Proteus species NOT DETECTED NOT DETECTED Final   Serratia marcescens NOT DETECTED NOT DETECTED Final   Haemophilus influenzae NOT DETECTED NOT DETECTED Final   Neisseria meningitidis NOT DETECTED NOT DETECTED Final   Pseudomonas aeruginosa NOT DETECTED NOT DETECTED Final   Candida albicans NOT DETECTED NOT DETECTED Final   Candida glabrata NOT DETECTED NOT DETECTED Final   Candida krusei NOT DETECTED NOT DETECTED Final   Candida parapsilosis NOT DETECTED NOT DETECTED Final   Candida tropicalis NOT DETECTED NOT DETECTED Final  Culture, blood (Routine X 2) w Reflex to ID Panel     Status: None   Collection Time: 01/26/17  3:01 PM  Result Value Ref Range Status   Specimen Description BLOOD RIGHT HAND  Final   Special Requests IN PEDIATRIC BOTTLE Blood Culture adequate volume  Final   Culture NO GROWTH 5 DAYS  Final   Report Status 01/31/2017 FINAL  Final         Radiology Studies: Dg Chest Port 1 View  Result Date: 02/01/2017 CLINICAL DATA:  PICC line placement. EXAM: PORTABLE CHEST 1 VIEW COMPARISON:  01/26/2017 FINDINGS: The cardiac silhouette, mediastinal and hilar contours are within normal limits and stable. Stable left IJ right ventricular pacer wire. New right-sided PICC line tip is in the distal SVC. No complicating features. Patchy E right upper lobe airspace process suspicious for pneumonia. Persistent  streaky left basilar subsegmental atelectasis. No pleural effusion or pneumothorax. IMPRESSION: 1. Right PICC line tip in good position without complicating features. 2. Patchy right upper lobe airspace process, suspicious for pneumonia. 3. Persistent streaky left basilar atelectasis and/or scarring. Electronically Signed   By: Rudie Meyer M.D.   On: 02/01/2017 10:30        Scheduled Meds: .  carvedilol  3.125 mg Oral BID  . polyethylene glycol  17 g Oral Daily  . senna-docusate  1 tablet Oral BID  . sodium chloride flush  3 mL Intravenous Q12H   Continuous Infusions: . sodium chloride    .  ceFAZolin (ANCEF) IV Stopped (02/02/17 1610)     LOS: 12 days     Alwyn Ren, MD [facility]  If 7PM-7AM, please contact night-coverage [email or web address] Password [email or web address] 02/02/2017, 3:27 PM

## 2017-02-02 NOTE — Plan of Care (Signed)
Problem: Physical Regulation: Goal: Ability to maintain clinical measurements within normal limits will improve Outcome: Progressing VSS and within normal limits

## 2017-02-02 NOTE — Progress Notes (Signed)
Physical Therapy Treatment Patient Details Name: Patrick Knox MRN: [id number] DOB: 1940-11-01 Today's Date: 02/02/2017    History of Present Illness Pt is 76 yo male with known HTN, HL, sCHF, EF 35%, a-fib on Coumadin, complete heart block and s/p pacemaker, CAD, s/p CABG, tobacco use, CKD stage III, presented with lethargy and weakness, poor oral intake, progressively worsening confusion. 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 Continue to feel pt needs ST-SNF for further rehab before return home.   Follow Up Recommendations  SNF     Equipment Recommendations  Rolling walker with 5" wheels    Recommendations for Other Services       Precautions / Restrictions Precautions Precautions: Fall Restrictions Weight Bearing Restrictions: No    Mobility  Bed Mobility Overal bed mobility: Needs Assistance Bed Mobility: Rolling;Sidelying to Sit Rolling: Supervision Sidelying to sit: Min guard;HOB elevated       General bed mobility comments: Incr time and use of rail  Transfers Overall transfer level: Needs assistance Equipment used: Rolling walker (2 wheeled) Transfers: Sit to/from Stand Sit to Stand: Min assist         General transfer comment: Assist to bring hips up and for balance.  Ambulation/Gait Ambulation/Gait assistance: Min assist;+2 safety/equipment Ambulation Distance (Feet): 80 Feet Assistive device: Rolling walker (2 wheeled) Gait Pattern/deviations: Step-through pattern;Decreased step length - right;Decreased step length - left;Shuffle;Trunk flexed Gait velocity: decr Gait velocity interpretation: Below normal speed for age/gender General Gait Details: Assist for balance and support. Pt with 2 brief standing rest breaks. Verbal cues to stand more erect   Stairs            Wheelchair Mobility    Modified Rankin (Stroke Patients Only)       Balance  Overall balance assessment: Needs assistance Sitting-balance support: No upper extremity supported;Feet supported Sitting balance-Leahy Scale: Good     Standing balance support: Bilateral upper extremity supported Standing balance-Leahy Scale: Poor Standing balance comment: walker and min guard for static standing                            Cognition Arousal/Alertness: Awake/alert Behavior During Therapy: Flat affect Overall Cognitive Status: Within Functional Limits for tasks assessed                                        Exercises      General Comments        Pertinent Vitals/Pain Pain Assessment: Faces Pain Score: 9  Pain Location: abdomen Pain Descriptors / Indicators: Aching;Sore Pain Intervention(s): Limited activity within patient's tolerance;Monitored during session;Repositioned    Home Living                      Prior Function            PT Goals (current goals can now be found in the care plan section) Progress towards PT goals: Progressing toward goals    Frequency    Min 2X/week      PT Plan Current plan remains appropriate    Co-evaluation              AM-PAC PT "6 Clicks" Daily Activity  Outcome Measure  Difficulty turning over in bed (including adjusting bedclothes, sheets and blankets)?: A  Lot Difficulty moving from lying on back to sitting on the side of the bed? : A Lot Difficulty sitting down on and standing up from a chair with arms (e.g., wheelchair, bedside commode, etc,.)?: Unable Help needed moving to and from a bed to chair (including a wheelchair)?: A Little Help needed walking in hospital room?: A Little Help needed climbing 3-5 steps with a railing? : Total 6 Click Score: 12    End of Session Equipment Utilized During Treatment: Gait belt Activity Tolerance: Patient tolerated treatment well Patient left: in chair;with call bell/phone within reach Nurse Communication: Mobility  status PT Visit Diagnosis: Unsteadiness on feet (R26.81);Other abnormalities of gait and mobility (R26.89);Muscle weakness (generalized) (M62.81)     Time: 9147-8295 PT Time Calculation (min) (ACUTE ONLY): 17 min  Charges:  $Gait Training: 8-22 mins                    G Codes:       [facility] PT [phone]    Angelina Ok [facility] 02/02/2017, 2:56 PM

## 2017-02-02 NOTE — Plan of Care (Signed)
Problem: Activity: Goal: Risk for activity intolerance will decrease Outcome: Progressing Patient ambulated in hall with PT. Tolerated well. Educated on importance of frequently repositioning in bed. Verbalizes understanding of importance

## 2017-02-03 DIAGNOSIS — A419 Sepsis, unspecified organism: Secondary | ICD-10-CM

## 2017-02-03 LAB — PROTIME-INR
INR: 1.27
Prothrombin Time: 15.8 seconds — ABNORMAL HIGH (ref 11.4–15.2)

## 2017-02-03 LAB — GLUCOSE, CAPILLARY: GLUCOSE-CAPILLARY: 105 mg/dL — AB (ref 65–99)

## 2017-02-03 NOTE — Progress Notes (Addendum)
CSW following for possible SNF placement. Challenging placement due to temporary external pacemaker. Patrick Knox has rescinded bed offer. [name] has accepted patient. CSW discussed with son, Patrick Knox, yesterday afternoon that [name] was looking at referral. Son understanding of need to refer to [name]. CSW discussed with son again today and son with questions on timeline of permanent pacemaker placement. Son requesting MD call him in the morning.   CSW received [employer] authorization good until 02/05/17. CSW to call [facility] with updated facility information when discharge plan finalized.  [name], LCSWA [phone]

## 2017-02-03 NOTE — Plan of Care (Signed)
Problem: Tissue Perfusion: Goal: Risk factors for ineffective tissue perfusion will decrease Outcome: Progressing Patient maintains oxygen saturation above 95% on room air. VSS and WNL

## 2017-02-03 NOTE — Care Management Important Message (Signed)
Important Message  Patient Details  Name: Patrick Knox MRN: [id number] Date of Birth: April 09, 1941   Medicare Important Message Given:  Yes    Windie Marasco [place] 02/03/2017, 10:52 AM

## 2017-02-03 NOTE — Progress Notes (Signed)
PROGRESS NOTE    Patrick Knox  ZOX:[id number] DOB: 10/18/1940 DOA: 01/21/2017 PCP: Kirby Funk, MD    Brief Narrative: 76 y.o.malewith PPM, history of CAD with CABG, HTN, sCHF, A-fib on Coumadin, complete heart block with pacemaker, CKD 3, tobacco abuse presents from home with confusion and weakness. Apparently also started Flexeril recently. Prior to coming in to the hospital, per wife, he was very active and volunteers with the church etc.  Found to have BP 86/46, sodium 127, exposed pacer wires and and pus coming out of pacemaker pocket. Blood cultures 2/2 for MSSA.  Patient had permanent pacemaker removed and has temporary pacemaker placed. Seen by EP specialist would like to see patient 2 weeks after discharge for consideration of permanent pacemaker insertion at that point. Patient had TEE which reported no endocarditis. ID consulted and has been assisting with antibiotic regimen. Patient was due to be discharged to snf yesterday.but snf dont want to take hi m due to external pacemaker.    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  HTN continu coreg.patient was on zestril at home.  HYPERLIDEMIA-RESTART crestor.  AFIB was on coumadin at home.?restart.  ckd stable.  Cad/cabg continue bb.     DVT prophylaxis RESTART COUMADIN IF OK WITH CARDIOLOGY Code Status: FULL Family Communication: NONE Disposition  PlanTBD   Consultants:     Procedures  Antimicrobials:    Subjective:   Objective: Vitals:   02/02/17 0447 02/02/17 1341 02/02/17 2138 02/03/17 0518  BP: 120/71 115/65 132/70 (!) 152/68  Pulse: 60 (!) 59 (!) 58 60  Resp: 18 (!) Temp: 98.8 F (37.1 C) 98.3 F (36.8 C) 98.4 F (36.9 C) 98.4 F (36.9 C)  TempSrc: Oral Oral Oral Oral  SpO2: 99% 97% 96% 99%  Weight: 66.6 kg (146 lb 13.2 oz)   63.4 kg (139 lb 12.4 oz)  Height:        Intake/Output Summary (Last 24 hours) at 02/03/17 1130 Last data filed at 02/03/17 0518  Gross per 24 hour  Intake              422 ml  Output             1120 ml  Net             -698 ml   Filed Weights   02/01/17 0428 02/02/17 0447 02/03/17 0518  Weight: 66.4 kg (146 lb 6.2 oz) 66.6 kg (146 lb 13.2 oz) 63.4 kg (139 lb 12.4 oz)    Examination:  General exam: Appears calm and comfortable  Respiratory system: Clear to auscultation. Respiratory effort normal. Cardiovascular system: S1 & S2 heard, RRR. No JVD, murmurs, rubs, gallops or clicks. No pedal edema. Gastrointestinal system: Abdomen is nondistended, soft and nontender. No organomegaly or masses felt. Normal bowel sounds heard. Central nervous system: Alert and oriented. No focal neurological deficits. Extremities: Symmetric 5 x 5 power. Skin: No rashes, lesions or ulcers Psychiatry: Judgement and insight  appear normal. Mood & affect appropriate.     Data Reviewed: I have personally reviewed following labs and imaging studies  CBC: No results for input(s): WBC, NEUTROABS, HGB, HCT, MCV, PLT in the last 168 hours. Basic Metabolic Panel:  Recent Labs Lab 01/28/17 0422 01/31/17 0332  NA 134* 130*  K 5.2* 4.9  CL 104 97*  CO2 22 26  GLUCOSE 116* 127*  BUN 38* 31*  CREATININE 1.25* 1.14  CALCIUM 9.1 8.7*   GFR: Estimated Creatinine Clearance: 50.2 mL/min (by C-G formula based on SCr of 1.14 mg/dL). Liver Function Tests: No results for input(s): AST, ALT,  ALKPHOS, BILITOT, PROT, ALBUMIN in the last 168 hours. No results for input(s): LIPASE, AMYLASE in the last 168 hours. No results for input(s): AMMONIA in the last 168 hours. Coagulation Profile:  Recent Labs Lab 01/30/17 0525 01/31/17 0332 02/01/17 0412 02/02/17 0403 02/03/17 0406  INR 1.34 1.32 1.26 1.31 1.27   Cardiac Enzymes: No results for input(s): CKTOTAL, CKMB, CKMBINDEX, TROPONINI in the last 168 hours. BNP (last 3 results) No results for input(s): PROBNP in the last 8760 hours. HbA1C: No results for input(s): HGBA1C in the last 72 hours. CBG:  Recent Labs Lab 01/31/17 0736 01/31/17 2159 02/01/17 0725 02/02/17 0753 02/03/17 0734  GLUCAP 117* 217* 130* 116* 105*   Lipid Profile: No results for input(s): CHOL, HDL, LDLCALC, TRIG, CHOLHDL, LDLDIRECT in the last 72 hours. Thyroid Function Tests: No results for input(s): TSH, T4TOTAL, FREET4, T3FREE, THYROIDAB in the last 72 hours. Anemia Panel: No results for input(s): VITAMINB12, FOLATE, FERRITIN, TIBC, IRON, RETICCTPCT in the last 72 hours. Sepsis Labs: No results for input(s): PROCALCITON, LATICACIDVEN in the last 168 hours.  Recent Results (from the past 240 hour(s))  Culture, blood (Routine X 2) w Reflex to ID Panel     Status: None   Collection Time: 01/26/17  3:01 PM  Result Value Ref Range Status   Specimen Description BLOOD RIGHT HAND  Final   Special Requests IN PEDIATRIC BOTTLE Blood Culture adequate volume  Final   Culture NO GROWTH 5 DAYS  Final   Report Status 01/31/2017 FINAL  Final         Radiology Studies: No results found.      Scheduled Meds: . carvedilol  3.125 mg Oral BID  . polyethylene glycol  17 g Oral Daily  . senna-docusate  1 tablet Oral BID  . sodium chloride flush  3 mL Intravenous Q12H   Continuous Infusions: . sodium chloride    .  ceFAZolin (ANCEF) IV Stopped (02/03/17 0548)     LOS: 13 days       Alwyn Ren, MD [facility]  If  7PM-7AM, please contact night-coverage [email or web address] Password [email or web address] 02/03/2017, 11:30 AM

## 2017-02-04 LAB — CBC WITH DIFFERENTIAL/PLATELET
BASOS PCT: 1 %
Basophils Absolute: 0 10*3/uL (ref 0.0–0.1)
EOS ABS: 0.1 10*3/uL (ref 0.0–0.7)
EOS PCT: 1 %
HEMATOCRIT: 29.6 % — AB (ref 39.0–52.0)
Hemoglobin: 9.6 g/dL — ABNORMAL LOW (ref 13.0–17.0)
Lymphocytes Relative: 26 %
Lymphs Abs: 1.6 10*3/uL (ref 0.7–4.0)
MCH: 27.5 pg (ref 26.0–34.0)
MCHC: 32.4 g/dL (ref 30.0–36.0)
MCV: 84.8 fL (ref 78.0–100.0)
MONO ABS: 0.6 10*3/uL (ref 0.1–1.0)
MONOS PCT: 9 %
NEUTROS ABS: 4.1 10*3/uL (ref 1.7–7.7)
Neutrophils Relative %: 63 %
Platelets: 235 10*3/uL (ref 150–400)
RBC: 3.49 MIL/uL — ABNORMAL LOW (ref 4.22–5.81)
RDW: 14 % (ref 11.5–15.5)
WBC: 6.3 10*3/uL (ref 4.0–10.5)

## 2017-02-04 LAB — BASIC METABOLIC PANEL
ANION GAP: 8 (ref 5–15)
BUN: 24 mg/dL — ABNORMAL HIGH (ref 6–20)
CHLORIDE: 94 mmol/L — AB (ref 101–111)
CO2: 27 mmol/L (ref 22–32)
Calcium: 8.7 mg/dL — ABNORMAL LOW (ref 8.9–10.3)
Creatinine, Ser: 1.07 mg/dL (ref 0.61–1.24)
GFR calc non Af Amer: >60 mL/min (ref >60)
Glucose, Bld: 102 mg/dL — ABNORMAL HIGH (ref 65–99)
POTASSIUM: 4.4 mmol/L (ref 3.5–5.1)
SODIUM: 129 mmol/L — AB (ref 135–145)

## 2017-02-04 LAB — GLUCOSE, CAPILLARY
GLUCOSE-CAPILLARY: 110 mg/dL — AB (ref 65–99)
GLUCOSE-CAPILLARY: 111 mg/dL — AB (ref 65–99)
Glucose-Capillary: 145 mg/dL — ABNORMAL HIGH (ref 65–99)

## 2017-02-04 LAB — PROTIME-INR
INR: 1.27
PROTHROMBIN TIME: 15.8 s — AB (ref 11.4–15.2)

## 2017-02-04 MED ORDER — ACETAMINOPHEN 325 MG PO TABS: 325.0000 mg | ORAL_TABLET | ORAL | Status: DC | PRN

## 2017-02-04 MED ORDER — CEFAZOLIN IV (FOR PTA / DISCHARGE USE ONLY): 2.0000 g | Freq: Three times a day (TID) | INTRAVENOUS | 0 refills | Status: DC

## 2017-02-04 MED ORDER — CARVEDILOL 3.125 MG PO TABS: 3.1250 mg | ORAL_TABLET | Freq: Two times a day (BID) | ORAL | 0 refills | Status: AC

## 2017-02-04 MED ORDER — CEFAZOLIN SODIUM-DEXTROSE 2-4 GM/100ML-% IV SOLN: 2.0000 g | Freq: Three times a day (TID) | INTRAVENOUS | 0 refills | Status: DC

## 2017-02-04 NOTE — Discharge Summary (Addendum)
Physician Discharge Summary  Patrick Knox ZOX:[id number] DOB: 29-Jun-1940 DOA: 01/21/2017  PCP: Kirby Funk, MD  Admit date: 01/21/2017 Discharge date: 02/04/2017  Admitted From: HOME Disposition:   [facility] Recommendations for Outpatient Follow-up:  1. Follow up with PCP in 1-2 weeks 2. Please obtain BMP/CBC ONCE EVERY WEEK  Home Health:NO Equipment/DeviceNO  Discharge Condition:STABLE CODE STATUSFULL Diet recommendation:CARDIAC  Brief/Interim Summary:76 y.o.malewith PPM, history of CAD with CABG, HTN, sCHF, A-fib on Coumadin, complete heart block with pacemaker, CKD 3, tobacco abuse presents from home with confusion and weakness. Apparently also started Flexeril recently. Prior to coming in to the hospital, per wife, he was very active and volunteers with the church etc.  Found to have BP 86/46, sodium 127, exposed pacer wires and and pus coming out of pacemaker pocket. Blood cultures 2/2 for MSSA.  Patient had permanent pacemaker removed and has temporary pacemaker placed. Seen by EP specialist would like to see patient 2 weeks after discharge for consideration of permanent pacemaker insertion at that point. Patient had TEE which reported no endocarditis. ID consulted and has been assisting with antibiotic regimen. Patient will need ancef till nov 7th.    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  HTN continu coreg.patient  was on zestril at home.  HYPERLIDEMIA-RESTART crestor.  AFIB was on coumadin at home.?restart.  ckd stable.  Cad/cabg continue bb.   Discharge Instructions     Contact information for follow-up providers    [facility] Follow up on 02/10/2017.   Specialty:  Cardiology Why:  at 4:30PM  Contact information: [address], [address] [place] [address] [phone]       Sheilah Pigeon, PA-C Follow up on 02/17/2017.   Specialty:  Cardiology Why:  at [facility] information: [address] [place] [address] [phone]            Contact information for after-discharge care    Destination    [facility] .   Specialty:  Skilled Nursing Facility Contact information: [address] [place] [address] [phone]                 No Known Allergies  Consultations:    Procedures/Studies: Dg Chest 2 View  Result Date: 01/26/2017 CLINICAL DATA:  Status post permanent pacemaker change out. EXAM: CHEST  2 VIEW COMPARISON:  Portable chest x-ray of January 21, 2017 FINDINGS: The previous pacemaker and electrodes have been removed. A single electrode has been placed with the tip in the right ventricular apex. The generator lies above the left clavicle. The lungs are adequately inflated. There is no postprocedure pneumothorax. There is stable scarring at the left lung base. The heart is top-normal in size. The pulmonary vascularity is normal. There is calcification in the wall of the aortic arch. The patient has undergone previous CABG. IMPRESSION: No postprocedure complication following permanent pacemaker change out. Previous CABG.  Thoracic aortic atherosclerosis. Electronically Signed  By: David  [place] M.D.   On: 01/26/2017 07:25   Ct Head Wo Contrast  Result Date: 01/21/2017 CLINICAL DATA:  Altered level consciousness. EXAM: CT HEAD WITHOUT CONTRAST  TECHNIQUE: Contiguous axial images were obtained from the base of the skull through the vertex without intravenous contrast. COMPARISON:  None. FINDINGS: Brain: No acute intracranial hemorrhage. No focal mass lesion. No CT evidence of acute infarction. No midline shift or mass effect. No hydrocephalus. Basilar cisterns are patent. Vascular: No hyperdense vessel or unexpected calcification. Skull: Normal. Negative for fracture or focal lesion. Sinuses/Orbits: Paranasal sinuses and mastoid air cells are clear. Orbits are clear. Other: None. IMPRESSION: No acute intracranial findings. Electronically Signed   By: Genevive Bi M.D.   On: 01/21/2017 23:18   Ct Thoracic Spine W Contrast  Result Date: 01/27/2017 CLINICAL DATA:  Chronic back pain EXAM: CT THORACIC AND LUMBAR SPINE WITH AND WITHOUT CONTRAST TECHNIQUE: Multidetector CT imaging of the thoracic and lumbar spine was performed with intravenous contrast. Multiplanar CT image reconstructions were also generated. CONTRAST:  ISOVUE-300 IOPAMIDOL (ISOVUE-300) INJECTION 61% COMPARISON:  None. FINDINGS: CT THORACIC SPINE FINDINGS Alignment: Physiologic Vertebrae: Multilevel anterior osteophyte formation. No acute fracture. No significant height loss. No focal osseous lesions. Paraspinal and other soft tissues: Right upper lobe nodule measures 1.6 x 1.1 cm. Incompletely visualized partially necrotic lesion also seen within the right upper lobe. Disc levels: No spinal canal stenosis. No large disc herniation is visible. CT LUMBAR SPINE FINDINGS Segmentation: 5 lumbar type vertebrae. Alignment: Normal. Vertebrae: No acute fracture. No erosive change. There is degenerative endplate remodeling at L2-L3 and L4-L5 with associated osteophyte formation. Paraspinal and other soft tissues: Aortic atherosclerosis. Disc levels: Above the T12 level, there is no disc herniation, spinal canal stenosis or neuroforaminal stenosis. T12-L1: Normal disc space and facets. No  spinal canal or neuroforaminal stenosis. L1-L2: Normal disc and facets.  No stenosis. L2-L3: Disc space narrowing with small disc bulge. Moderate left neural foraminal stenosis. L3-L4: Small disc bulge with mild bilateral neural foraminal stenosis. L4-L5: Small disc bulge with bilateral mild facet hypertrophy. Mild spinal canal stenosis. Moderate right neural foraminal stenosis. L5-S1: Disc space narrowing with endplate osteophyte formation and small disc bulge. Severe right and mild left neural foraminal stenosis. Visualized sacrum: Normal. IMPRESSION: 1. Incompletely visualized partially necrotic areas of consolidation in the right lung. This may indicate necrotizing pneumonia or partially necrotic tumor. Dedicated chest CT is recommended. 2. No acute abnormality of the lumbar spine. 3. Severe right L5-S1 neural foraminal stenosis. 4. Moderate right L4-L5 and left L2-L3 neural foraminal stenosis. Electronically Signed   By: Deatra Robinson M.D.   On: 01/27/2017 15:17   Ct Lumbar Spine W Contrast  Result Date: 01/27/2017 CLINICAL DATA:  Chronic back pain EXAM: CT THORACIC AND LUMBAR SPINE WITH AND WITHOUT CONTRAST TECHNIQUE: Multidetector CT imaging of the thoracic and lumbar spine was performed with intravenous contrast. Multiplanar CT image reconstructions were also generated. CONTRAST:  ISOVUE-300 IOPAMIDOL (ISOVUE-300) INJECTION 61% COMPARISON:  None. FINDINGS: CT THORACIC SPINE FINDINGS Alignment: Physiologic Vertebrae: Multilevel anterior osteophyte formation. No acute fracture. No significant height loss. No focal osseous lesions. Paraspinal and other soft tissues: Right upper lobe nodule measures 1.6 x 1.1 cm. Incompletely visualized partially necrotic lesion also seen within the right upper lobe. Disc levels: No spinal canal stenosis. No large disc herniation is visible. CT LUMBAR SPINE FINDINGS Segmentation: 5 lumbar type vertebrae. Alignment: Normal. Vertebrae: No acute fracture. No erosive  change. There is degenerative endplate  remodeling at L2-L3 and L4-L5 with associated osteophyte formation. Paraspinal and other soft tissues: Aortic atherosclerosis. Disc levels: Above the T12 level, there is no disc herniation, spinal canal stenosis or neuroforaminal stenosis. T12-L1: Normal disc space and facets. No spinal canal or neuroforaminal stenosis. L1-L2: Normal disc and facets.  No stenosis. L2-L3: Disc space narrowing with small disc bulge. Moderate left neural foraminal stenosis. L3-L4: Small disc bulge with mild bilateral neural foraminal stenosis. L4-L5: Small disc bulge with bilateral mild facet hypertrophy. Mild spinal canal stenosis. Moderate right neural foraminal stenosis. L5-S1: Disc space narrowing with endplate osteophyte formation and small disc bulge. Severe right and mild left neural foraminal stenosis. Visualized sacrum: Normal. IMPRESSION: 1. Incompletely visualized partially necrotic areas of consolidation in the right lung. This may indicate necrotizing pneumonia or partially necrotic tumor. Dedicated chest CT is recommended. 2. No acute abnormality of the lumbar spine. 3. Severe right L5-S1 neural foraminal stenosis. 4. Moderate right L4-L5 and left L2-L3 neural foraminal stenosis. Electronically Signed   By: Deatra Robinson M.D.   On: 01/27/2017 15:17   Dg Chest Port 1 View  Result Date: 02/01/2017 CLINICAL DATA:  PICC line placement. EXAM: PORTABLE CHEST 1 VIEW COMPARISON:  01/26/2017 FINDINGS: The cardiac silhouette, mediastinal and hilar contours are within normal limits and stable. Stable left IJ right ventricular pacer wire. New right-sided PICC line tip is in the distal SVC. No complicating features. Patchy E right upper lobe airspace process suspicious for pneumonia. Persistent streaky left basilar subsegmental atelectasis. No pleural effusion or pneumothorax. IMPRESSION: 1. Right PICC line tip in good position without complicating features. 2. Patchy right upper lobe  airspace process, suspicious for pneumonia. 3. Persistent streaky left basilar atelectasis and/or scarring. Electronically Signed   By: Rudie Meyer M.D.   On: 02/01/2017 10:30   Dg Chest Portable 1 View  Result Date: 01/21/2017 CLINICAL DATA:  Neck pain.  Nonsmoker. EXAM: PORTABLE CHEST 1 VIEW COMPARISON:  04/09/2016; 06/10/2005 FINDINGS: Grossly unchanged cardiac silhouette and mediastinal contours given reduced lung volumes and AP projection. Post median sternotomy and CABG. Stable position of support apparatus. No focal airspace opacities. Minimal left basilar opacities favored to represent atelectasis. No discrete focal airspace opacities. No pleural effusion or pneumothorax. No evidence of edema. No definite acute osseus abnormalities. Several mildly dilated loops of bowel are seen below left hemidiaphragm. IMPRESSION: No definite acute cardiopulmonary disease on this AP portable examination. Further evaluation with a PA and lateral chest radiograph may be obtained as clinically indicated. Electronically Signed   By: Simonne Come M.D.   On: 01/21/2017 11:21    (Echo, Carotid, EGD, Colonoscopy, ERCP)    Subjective:   Discharge Exam: Vitals:   02/04/17 0649 02/04/17 0944  BP: 127/87 122/68  Pulse: 60 (!) 59  Resp:    Temp: 98.3 F (36.8 C) 98.1 F (36.7 C)  SpO2: 97% 100%   Vitals:   02/03/17 1342 02/03/17 2116 02/04/17 0649 02/04/17 0944  BP: 107/69 140/71 127/87 122/68  Pulse: (!) 59 61 60 (!) 59  Resp: 20 17    Temp: 98.6 F (37 C) 99 F (37.2 C) 98.3 F (36.8 C) 98.1 F (36.7 C)  TempSrc: Oral Oral Oral Oral  SpO2: 99% 97% 97% 100%  Weight:   63.6 kg (140 lb 3.4 oz)   Height:        General: Pt is alert, awake, not in acute distress Cardiovascular: RRR, S1/S2 +, no rubs, no gallops Respiratory: CTA bilaterally, no wheezing, no rhonchi Abdominal:  Soft, NT, ND, bowel sounds + Extremities: no edema, no cyanosis    The results of significant diagnostics from this  hospitalization (including imaging, microbiology, ancillary and laboratory) are listed below for reference.     Microbiology: Recent Results (from the past 240 hour(s))  Culture, blood (Routine X 2) w Reflex to ID Panel     Status: None   Collection Time: 01/26/17  3:01 PM  Result Value Ref Range Status   Specimen Description BLOOD RIGHT HAND  Final   Special Requests IN PEDIATRIC BOTTLE Blood Culture adequate volume  Final   Culture NO GROWTH 5 DAYS  Final   Report Status 01/31/2017 FINAL  Final     Labs: BNP (last 3 results)  Recent Labs  02/17/16 1305 01/21/17 2329  BNP 68.0 764.7*   Basic Metabolic Panel:  Recent Labs Lab 01/31/17 0332 02/04/17 0349  NA 130* 129*  K 4.9 4.4  CL 97* 94*  CO2 26 27  GLUCOSE 127* 102*  BUN 31* 24*  CREATININE 1.14 1.07  CALCIUM 8.7* 8.7*   Liver Function Tests: No results for input(s): AST, ALT, ALKPHOS, BILITOT, PROT, ALBUMIN in the last 168 hours. No results for input(s): LIPASE, AMYLASE in the last 168 hours. No results for input(s): AMMONIA in the last 168 hours. CBC:  Recent Labs Lab 02/04/17 0349  WBC 6.3  NEUTROABS 4.1  HGB 9.6*  HCT 29.6*  MCV 84.8  PLT 235   Cardiac Enzymes: No results for input(s): CKTOTAL, CKMB, CKMBINDEX, TROPONINI in the last 168 hours. BNP: Invalid input(s): POCBNP CBG:  Recent Labs Lab 02/01/17 0725 02/02/17 0753 02/03/17 0734 02/04/17 0737 02/04/17 1140  GLUCAP 130* 116* 105* 110* 111*   D-Dimer No results for input(s): DDIMER in the last 72 hours. Hgb A1c No results for input(s): HGBA1C in the last 72 hours. Lipid Profile No results for input(s): CHOL, HDL, LDLCALC, TRIG, CHOLHDL, LDLDIRECT in the last 72 hours. Thyroid function studies No results for input(s): TSH, T4TOTAL, T3FREE, THYROIDAB in the last 72 hours.  Invalid input(s): FREET3 Anemia work up No results for input(s): VITAMINB12, FOLATE, FERRITIN, TIBC, IRON, RETICCTPCT in the last 72 hours. Urinalysis     Component Value Date/Time   COLORURINE YELLOW 01/21/2017 1215   APPEARANCEUR CLOUDY (A) 01/21/2017 1215   LABSPEC 1.025 01/21/2017 1215   PHURINE 5.5 01/21/2017 1215   GLUCOSEU NEGATIVE 01/21/2017 1215   HGBUR LARGE (A) 01/21/2017 1215   BILIRUBINUR NEGATIVE 01/21/2017 1215   KETONESUR NEGATIVE 01/21/2017 1215   PROTEINUR >300 (A) 01/21/2017 1215   NITRITE NEGATIVE 01/21/2017 1215   LEUKOCYTESUR NEGATIVE 01/21/2017 1215   Sepsis Labs Invalid input(s): PROCALCITONIN,  WBC,  LACTICIDVEN Microbiology Recent Results (from the past 240 hour(s))  Culture, blood (Routine X 2) w Reflex to ID Panel     Status: None   Collection Time: 01/26/17  3:01 PM  Result Value Ref Range Status   Specimen Description BLOOD RIGHT HAND  Final   Special Requests IN PEDIATRIC BOTTLE Blood Culture adequate volume  Final   Culture NO GROWTH 5 DAYS  Final   Report Status 01/31/2017 FINAL  Final     Time coordinating discharge: Over 30 minutes  SIGNED:   Alwyn Ren, MD  [facility] 02/04/2017, 1:25 PM  If 7PM-7AM, please contact night-coverage [email or web address] Password [email or web address]

## 2017-02-04 NOTE — Progress Notes (Signed)
Asked to comment on anticoagulation at discharge. As patient is maintaining SR, would leave off warfarin for now per Dr Ladona Ridgel. Will resume in the outpatient setting.    I will remove 1/2 of sutures today from extraction site. He has appt next week with device clinic to remove rest of sutures.  [name], NP 02/04/2017 10:06 AM  EP Attending  Discussed with [name], NP. Agree with above.   Patrick Knox.D.

## 2017-02-04 NOTE — Progress Notes (Signed)
CSW following to facilitate discharge planning. CSW worked with patient and family throughout the day to coordinate discharge.  11:35 AM: CSW met with patient, patient's wife, and patient's niece at bedside to discuss bed offers and availability. Patient's wife requested that CSW contact facilities that would be closer to [facility], so that she would have less of a commute; it is difficult for her to get to [place], she doesn't drive much anymore and prefers to have others provide transport for her. CSW reached out to [facility] and [facility] to determine if they could accept patient and had beds available. CSW indicated that insurance had approved the patient to admit and he would likely be medically stable to transition, so the patient could admit to SNF for rehab today. Patient's wife and niece indicated that they would remain in the hospital for the duration of the day.   1:20 PM: CSW received updates from [facility] that they had no beds available. CSW received update from [facility] that they were unable to accept the patient's external pacemaker. CSW confirmed with [name] that they had insurance authorization information and continued bed availability for patient. MD aware.  2:30 PM: CSW went to patient's room to discuss [name] being the only option available. Patient was alone in room; said that the patient's wife had left and wouldn't be able to come back today. CSW informed patient of options. Patient's son indicated that he still wanted to go to rehab and would accept the bed offer. Patient requested that CSW contact patient's son to discuss. Patient's son indicated understanding that [name] was the only option, and that the family would pursue because it was what the patient wanted. Patient's son indicated that there would be no way he could come to [place] today as he is working out of town in [place], but would be able to get to the hospital tomorrow.  [name] cannot accept patient until family completes paperwork; patient will not be able to admit until tomorrow. MD aware.   CSW to follow to facilitate discharge tomorrow when family can be in the area to support patient's transition.   Laveda Abbe, [place] Clinical Social Worker [phone]

## 2017-02-05 LAB — PROTIME-INR
INR: 1.28
Prothrombin Time: 15.9 seconds — ABNORMAL HIGH (ref 11.4–15.2)

## 2017-02-05 LAB — GLUCOSE, CAPILLARY: Glucose-Capillary: 94 mg/dL (ref 65–99)

## 2017-02-05 NOTE — Progress Notes (Signed)
PROGRESS NOTE    Patrick Knox  JWJ:[id number] DOB: 1940-08-31 DOA: 01/21/2017 PCP: Kirby Funk, MD    Brief Narrative: 76 y.o.malewith PPM, history of CAD with CABG, HTN, sCHF, A-fib on Coumadin, complete heart block with pacemaker, CKD 3, tobacco abuse presents from home with confusion and weakness. Apparently also started Flexeril recently. Prior to coming in to the hospital, per wife, he was very active and volunteers with the church etc.  Found to have BP 86/46, sodium 127, exposed pacer wires and and pus coming out of pacemaker pocket. Blood cultures 2/2 for MSSA.  Patient had permanent pacemaker removed and has temporary pacemaker placed. Seen by EP specialist would like to see patient 2 weeks after discharge for consideration of permanent pacemaker insertion at that point. Patient had TEE which reported no endocarditis. ID consulted and has been assisting with antibiotic regimen. Patient was due to be discharged to snf yesterday.but snf dont want to take hi m due to external pacemaker.     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  HTN continu coreg.patient was on zestril at home.  HYPERLIDEMIA-RESTART crestor.  AFIB was on coumadin at home.?restart.  ckd stable.  Cad/cabg continue bb.   DVT prophylaxis: scd Code Status:full Family Communication:none Disposition Plan: tomorrow dc   Consultants:  cards    Procedures: external pacemaker    Antimicrobials:  ancef  Subjective:no co  Objective: Vitals:   02/04/17 1300 02/04/17 2137 02/05/17 0516 02/05/17 0739  BP:  116/70 115/72 112/68  Pulse:  61 60 60  Resp:  19 (!) 23 18  Temp: 99 F (37.2 C) 98.4 F (36.9 C) 98.6 F (37 C) 98.6 F (37 C)  TempSrc: Oral Oral Oral Oral  SpO2:  99% 99% 99%  Weight:   63.9 kg (140 lb 14.4 oz)   Height:        Intake/Output Summary (Last 24 hours) at 02/05/17 0953 Last data filed at 02/05/17 6213  Gross per 24 hour  Intake             1200 ml  Output             1150 ml  Net               50 ml   Filed Weights   02/03/17 0518 02/04/17 0649 02/05/17 0516  Weight: 63.4 kg (139 lb 12.4 oz) 63.6 kg (140 lb 3.4 oz) 63.9 kg (140 lb 14.4 oz)    Examination:  General exam: Appears calm and comfortable  Respiratory system: Clear to auscultation. Respiratory effort normal. Cardiovascular system: S1 & S2 heard, RRR. No JVD, murmurs, rubs, gallops or clicks. No pedal edema.external pacer. Gastrointestinal system: Abdomen is nondistended, soft and nontender. No organomegaly or masses felt. Normal bowel sounds heard. Central nervous system: Alert and oriented. No focal neurological deficits. Extremities: Symmetric 5 x 5 power. Skin: No rashes, lesions or ulcers Psychiatry: Judgement and insight appear normal. Mood & affect appropriate.  Data Reviewed: I have personally reviewed following labs and imaging studies  CBC:  Recent Labs Lab 02/04/17 0349  WBC 6.3  NEUTROABS 4.1  HGB 9.6*  HCT 29.6*  MCV 84.8  PLT 235   Basic Metabolic Panel:  Recent Labs Lab 01/31/17 0332 02/04/17 0349  NA 130* 129*  K 4.9 4.4  CL 97* 94*  CO2 26 27  GLUCOSE 127* 102*  BUN 31* 24*  CREATININE 1.14 1.07  CALCIUM 8.7* 8.7*   GFR: Estimated Creatinine Clearance: 53.9 mL/min (by C-G formula based on SCr of 1.07 mg/dL). Liver Function Tests: No results for input(s): AST, ALT,  ALKPHOS, BILITOT, PROT, ALBUMIN in the last 168 hours. No results for input(s): LIPASE, AMYLASE in the last 168 hours. No results for input(s): AMMONIA in the last 168 hours. Coagulation Profile:  Recent Labs Lab 02/01/17 0412 02/02/17 0403 02/03/17 0406 02/04/17 0349 02/05/17 0500  INR 1.26 1.31 1.27 1.27 1.28   Cardiac Enzymes: No results for input(s): CKTOTAL, CKMB, CKMBINDEX, TROPONINI in the last 168 hours. BNP (last 3 results) No results for input(s): PROBNP in the last 8760 hours. HbA1C: No results for input(s): HGBA1C in the last 72 hours. CBG:  Recent Labs Lab 02/03/17 0734 02/04/17 0737 02/04/17 1140 02/04/17 1556 02/05/17 0505  GLUCAP 105* 110* 111* 145* 94   Lipid Profile: No results for input(s): CHOL, HDL, LDLCALC, TRIG, CHOLHDL, LDLDIRECT in the last 72 hours. Thyroid Function Tests: No results for input(s): TSH, T4TOTAL, FREET4, T3FREE, THYROIDAB in the last 72 hours. Anemia Panel: No results for input(s): VITAMINB12, FOLATE, FERRITIN, TIBC, IRON, RETICCTPCT in the last 72 hours. Sepsis Labs: No results for input(s): PROCALCITON, LATICACIDVEN in the last 168 hours.  Recent Results (from the past 240 hour(s))  Culture, blood (Routine X 2) w Reflex to ID Panel     Status: None   Collection Time: 01/26/17  3:01 PM  Result Value Ref Range Status   Specimen Description BLOOD RIGHT HAND  Final   Special Requests IN PEDIATRIC BOTTLE Blood Culture adequate volume  Final   Culture NO GROWTH 5 DAYS  Final   Report Status 01/31/2017 FINAL  Final         Radiology Studies: No results found.      Scheduled Meds: . carvedilol  3.125 mg Oral BID  . polyethylene glycol  17 g Oral Daily  . senna-docusate  1 tablet Oral BID  . sodium chloride flush  3 mL Intravenous Q12H   Continuous Infusions: . sodium chloride    .  ceFAZolin (ANCEF) IV Stopped (02/05/17 1610)     LOS: 15 days     Alwyn Ren, MD [facility]  If 7PM-7AM,  please contact night-coverage [email or web address] Password [email or web address] 02/05/2017, 9:53 AM

## 2017-02-05 NOTE — Social Work (Signed)
Clinical Social Worker facilitated patient discharge including contacting patient family and facility to confirm patient discharge plans.  Clinical information faxed to facility and family agreeable with plan.    CSW arranged ambulance transport via [facility] to [name] .    RN to call [phone] to give report prior to discharge.  Clinical Social Worker will sign off for now as social work intervention is no longer needed. Please consult [place] again if new need arises.  Keene Breath, LCSW Clinical Social Worker [phone]

## 2017-02-10 ENCOUNTER — Ambulatory Visit: Payer: Medicare PPO

## 2017-02-11 ENCOUNTER — Ambulatory Visit (INDEPENDENT_AMBULATORY_CARE_PROVIDER_SITE_OTHER): Payer: Self-pay | Admitting: *Deleted

## 2017-02-11 DIAGNOSIS — I442 Atrioventricular block, complete: Secondary | ICD-10-CM

## 2017-02-11 NOTE — Progress Notes (Signed)
Wound re-check~ remaining sutures removed, small area middle of incision unapproximated~ site seen by JA recommended covering with 4x4 and tegaderm. Temp pacemaker dressing re-enforced. Instructions for incision site care sent with pt to SNF. ROV 02/17/2017 with Francis Dowse prior to pacemaker re-implant.

## 2017-02-14 ENCOUNTER — Telehealth: Payer: Self-pay | Admitting: *Deleted

## 2017-02-14 NOTE — Telephone Encounter (Signed)
Spoke to patient and [name] (where pt is currently in rehab).  Informed that pt needed to arrive at [facility] hospital on Friday at 9:30 am (originally scheduled to arrive at 7:30am) due to hospital schedule change. Patient & [name] verbalized understanding and agreeable to plan.

## 2017-02-16 NOTE — Progress Notes (Signed)
Cardiology Office Note Date:  02/17/2017  Patient ID:  Patrick, Knox 1940-07-05, MRN [id number] PCP:  Lavone Orn, MD  Cardiologist:  Dr. Stanford Breed Electrophysiologist: Dr. Caryl Comes    Chief Complaint: hospital f/u, pending PPM   History of Present Illness: Patrick Knox is a 76 y.o. male with history of CHB w/CRT-P implant, PAFib, CAD (remote CABG), chronic CHF, CM.  Most recently admitted with AMS on 01/21/17 found with MSSA bacteremia, hypotensive, septic, ultimately with veg on pacer wire, underwent device system extraction with temp-perm placement 01/25/17, finally discharged 02/05/17 to rehab/SNF.  He comes today to be seen for Dr. Caryl Comes, planned for re-implant of a permanent pacing system tomorrow, and removal of temp system.  BC (x1) drawn post extraction remained negative for 5 days.  ID recommendations were to complete 6 weeks of abx therapy reimplantation timing after repeat BC return negative, Dr. Caryl Comes felt to wait 2 weeks.  The patient is accompanied by his wife today.  He is feeling well.  Denies any symptoms of illness or fever.  NO CP, palpitations or SOB. No dizziness, near syncope or syncope.    Device information: SJM CTR-P implanted 02/18/16, Dr. Caryl Comes 01/25/17 system extraction 2/2 MSSA bacteremia, vegetation on lead    Past Medical History:  Diagnosis Date  . Atrial fibrillation ([place])   . CHF (congestive heart failure) ([place])   . CKD (chronic kidney disease) stage 3, GFR 30-59 ml/min   . High cholesterol   . Hypertension   . Hyponatremia 01/2017    Past Surgical History:  Procedure Laterality Date  . CORONARY ARTERY BYPASS GRAFT    . EP IMPLANTABLE DEVICE N/A 02/18/2016   Procedure: BiV Pacemaker Insertion CRT-P;  Surgeon: Deboraha Sprang, MD;  Location: [facility];  Service: Cardiovascular;  Laterality: N/A;  . HERNIA REPAIR    . PPM GENERATOR REMOVAL N/A 01/25/2017   Procedure: PPM GENERATOR REMOVAL;  Surgeon: Thompson Grayer, MD;  Location: [facility];  Service: Cardiovascular;  Laterality: N/A;  . TEE WITHOUT CARDIOVERSION N/A 01/25/2017   Procedure: TRANSESOPHAGEAL ECHOCARDIOGRAM (TEE);  Surgeon: Jerline Pain, MD;  Location: [facility] ENDOSCOPY;  Service: Cardiovascular;  Laterality: N/A;  . TEMPORARY PACEMAKER N/A 01/25/2017   Procedure: Temporary Pacemaker;  Surgeon: Thompson Grayer, MD;  Location: [facility];  Service: Cardiovascular;  Laterality: N/A;    Current Outpatient Prescriptions  Medication Sig Dispense Refill  . acetaminophen (TYLENOL) 325 MG tablet Take 1-2 tablets (325-650 mg total) by mouth every 4 (four) hours as needed for mild pain.    . carvedilol (COREG) 3.125 MG tablet Take 1 tablet (3.125 mg total) by mouth 2 (two) times daily. 60 tablet 0  . ceFAZolin (ANCEF) IVPB Inject 2 g into the vein every 8 (eight) hours. Indication:  MSSA bacteremia Last Day of Therapy:  March 09, 2017 Labs - Once weekly:  CBC/D and BMP, Labs - Every other week:  ESR and CRP 108 Units 0  . heparin flush 10 UNIT/ML SOLN injection Inject 5 mLs into the vein every 8 (eight) hours. Flush picc after finish with saline flush for picc maintenance.    Marland Kitchen lisinopril (PRINIVIL,ZESTRIL) 5 MG tablet Take 5 mg by mouth daily.    . nitroGLYCERIN (NITROSTAT) 0.4 MG SL tablet Place 0.4 mg under the tongue every 5 (five) minutes as needed for chest pain.   1  . rosuvastatin (CRESTOR) 40 MG tablet Take 1 tablet (40 mg total) by mouth every morning. (Patient taking  differently: Take 40 mg by mouth daily. ) 90 tablet 3  . Sodium Chloride Flush (SALINE FLUSH) 0.9 % SOLN Inject 10 mLs into the vein every 8 (eight) hours. Prior to antibiotic infusion and after antibiotic infusion, sash method for picc maintenance     No current facility-administered medications for this visit.     Allergies:   Patient has no known allergies.   Social History:  The patient  reports that he has been smoking Cigarettes.  He has been smoking about 0.50 packs per  day. He has never used smokeless tobacco. He reports that he does not drink alcohol or use drugs.   Family History:  The patient's family history includes Breast cancer in his sister; Hypertension in his father and mother.  ROS:  Please see the history of present illness.    All other systems are reviewed and otherwise negative.   PHYSICAL EXAM:  VS:  BP (!) 144/66   Pulse 60   Ht 6' 1"  (1.854 m)   Wt 151 lb (68.5 kg)   BMI 19.92 kg/m  BMI: Body mass index is 19.92 kg/m. Well nourished, well developed, in no acute distress  HEENT: normocephalic, atraumatic  Neck: no JVD, carotid bruits or masses Cardiac:  RRR; no significant murmurs, no rubs, or gallops Lungs:  CTA b/l, no wheezing, rhonchi or rales  Abd: soft, nontender MS: no deformity, age appropriate atrophy Ext: no edema  Skin: warm and dry, no rash Neuro:  No gross deficits appreciated Psych: euthymic mood, full affect  PPM extraction site has a small area approx 2-3 mm in diameter that remains open, no bleeding or drainage.  The site was evaluated by Dr. Caryl Comes, approx 6 inches of Iodofoam packing was placed.  temp-perm was sutured in place and stable, this was redressed with tegaderm   EKG:  Not done today Temp-perm interrogation done today with no R waves >30bom, pacing threshold is good, remains with output at 5V  01/25/17: TEE Study Conclusions - Left ventricle: The cavity size was normal. Wall thickness was   normal. Systolic function was moderately reduced. The estimated   ejection fraction was in the range of 35% to 40%. Diffuse   hypokinesis. - Aortic valve: No evidence of vegetation. There was trivial   regurgitation. - Mitral valve: No evidence of vegetation. There was mild   regurgitation. - Left atrium: No evidence of thrombus in the appendage. - Right atrium: No evidence of thrombus in the atrial cavity or   appendage. - Atrial septum: No defect or patent foramen ovale was identified. - Tricuspid  valve: No evidence of vegetation. - Pulmonic valve: No evidence of vegetation. - Superior vena cava: The study excluded a thrombus. - Impressions: 3 pacing wires noted. One lead at the junction of   the SVC and RA has a small mobile echodensity attached to it.   This likely represents a small vegetation or thrombus. Impressions: - 3 pacing wires noted. One lead at the junction of the SVC and RA   has a small mobile echodensity attached to it. This likely   represents a small vegetation or thrombus.  01/22/17: TTE Study Conclusions - Left ventricle: The cavity size was mildly dilated. Systolic   function was moderately reduced. The estimated ejection fraction   was in the range of 35% to 40%. Diffuse hypokinesis. Dyskinesis   of the basal inferolateral myocardium. Akinesis of the basal   inferior myocardium. Doppler parameters are consistent with   abnormal left ventricular  relaxation (grade 1 diastolic   dysfunction). - Aortic valve: Transvalvular velocity was within the normal range.   There was no stenosis. There was no regurgitation. - Mitral valve: Transvalvular velocity was within the normal range.   There was no evidence for stenosis. There was trivial   regurgitation. - Right ventricle: The cavity size was normal. Wall thickness was   normal. Systolic function was mildly reduced. - Tricuspid valve: There was mild regurgitation. - Pulmonary arteries: Systolic pressure was within the normal   range. PA peak pressure: 27 mm Hg (S).  Recent Labs: 01/21/2017: B Natriuretic Peptide 764.7 01/22/2017: TSH 1.867 01/24/2017: ALT 29 02/04/2017: BUN 24; Creatinine, Ser 1.07; Hemoglobin 9.6; Platelets 235; Potassium 4.4; Sodium 129  01/22/2017: Cholesterol 104; HDL 11; LDL Cholesterol 55; Total CHOL/HDL Ratio 9.5; Triglycerides 190; VLDL 38   Estimated Creatinine Clearance: 57.8 mL/min (by C-G formula based on SCr of 1.07 mg/dL).   Wt Readings from Last 3 Encounters:  02/17/17 151 lb  (68.5 kg)  02/05/17 140 lb 14.4 oz (63.9 kg)  01/18/17 165 lb (74.8 kg)     Other studies reviewed: Additional studies/records reviewed today include: summarized above  ASSESSMENT AND PLAN:  1. CHB/CRT-P      Device extraction 2/2 bacteremia w/temp-perm systim in place       [facility], his RN reported last dose of antibiotic is 03/09/17, in discussion with Dr. Caryl Comes, rather then having picc removed and placed on left side, will wait for PPM implant until completion of his antibiotics.  He will be scheduled for PPM 03/11/17, (will pull picc at time of PPM implant) he will be seen back on 03/10/17 to ensure completion of antibiotics.  We have made the patient's RN at [facility] aware of need for wound care RN (she confirms there is a wound care RN on staff) to manage care of his wound on L chest.  Dr. Caryl Comes wanted, and was written for packing to be removed every 3 days and repacked until healed.  Also written to leave the tegaderm dressing on temp-perm device/site in place.  Patient was reminded not to get these areas wet  2. ICM     No symptoms or exam findings to suggest fluid OL     On BB/ACE  3. CAD     No anginal complaints     On BB/statin tx (no ASA given typically on full a/c)  4. HTN      Looks OK, no changes  5. PAFib     CHA2DS2Vasc is at least 4, off his warfarin post extraction, with plans to re-start after re-implant     Will continue to hold off until re-implant completed       Disposition:  as above  Current medicines are reviewed at length with the patient today.  The patient did not have any concerns regarding medicines.  Venetia Night, PA-C 02/17/2017 10:32 AM     [facility] [address] [phone] (office)  [phone] (fax)

## 2017-02-17 ENCOUNTER — Ambulatory Visit (INDEPENDENT_AMBULATORY_CARE_PROVIDER_SITE_OTHER): Payer: Medicare PPO | Admitting: Physician Assistant

## 2017-02-17 VITALS — BP 144/66 | HR 60 | Ht 73.0 in | Wt 151.0 lb

## 2017-02-17 DIAGNOSIS — I10 Essential (primary) hypertension: Secondary | ICD-10-CM

## 2017-02-17 DIAGNOSIS — I255 Ischemic cardiomyopathy: Secondary | ICD-10-CM | POA: Diagnosis not present

## 2017-02-17 DIAGNOSIS — Z950 Presence of cardiac pacemaker: Secondary | ICD-10-CM | POA: Diagnosis not present

## 2017-02-17 DIAGNOSIS — I2510 Atherosclerotic heart disease of native coronary artery without angina pectoris: Secondary | ICD-10-CM

## 2017-02-17 DIAGNOSIS — I480 Paroxysmal atrial fibrillation: Secondary | ICD-10-CM

## 2017-02-17 NOTE — Patient Instructions (Signed)
Medication Instructions:   Your physician recommends that you continue on your current medications as directed. Please refer to the Current Medication list given to you today.   If you need a refill on your cardiac medications before your next appointment, please call your pharmacy.  Labwork: NONE ORDERED  TODAY    Testing/Procedures: NONE ORDERED  TODAY   Follow-Up:   AS SCHEDULED   Any Other Special Instructions Will Be Listed Below (If Applicable).                                                                                                                                                   

## 2017-02-18 ENCOUNTER — Encounter (HOSPITAL_COMMUNITY): Admission: RE | Payer: Self-pay | Source: Ambulatory Visit

## 2017-02-18 ENCOUNTER — Other Ambulatory Visit: Payer: Self-pay | Admitting: *Deleted

## 2017-02-18 ENCOUNTER — Ambulatory Visit (HOSPITAL_COMMUNITY): Admission: RE | Admit: 2017-02-18 | Payer: Medicare PPO | Source: Ambulatory Visit | Admitting: Internal Medicine

## 2017-02-18 SURGERY — PACEMAKER IMPLANT
Anesthesia: LOCAL

## 2017-02-18 NOTE — Patient Outreach (Signed)
[place] [facility]) Care Management  02/18/2017  Patrick Knox 1941/04/18 [id number]   Met with patient and wife at facility.  Patient eating lunch. RNCM discussed [facility] care management services briefly.  RNCM left brochure with RNCM contact.  Plan to follow up at next facility visit.  [name], RN, BSN, [place] [phone]) Business Cell  [phone]) Toll Free Office

## 2017-02-22 ENCOUNTER — Encounter: Payer: Self-pay | Admitting: Nurse Practitioner

## 2017-02-25 ENCOUNTER — Telehealth: Payer: Self-pay | Admitting: *Deleted

## 2017-02-25 ENCOUNTER — Other Ambulatory Visit: Payer: Self-pay | Admitting: *Deleted

## 2017-02-25 NOTE — Patient Outreach (Signed)
[place] [facility]) Care Management  02/25/2017  Patrick Knox 1940-05-25 [id number]   Met with patient at facility to follow up. Patient states he has not reviewed information.  He does not think he will need much more than home care.  RNCM will follow up at next facility visit. [name], RN, BSN, [place] [phone]) Business Cell  [phone]) Toll Free Office

## 2017-02-25 NOTE — Telephone Encounter (Signed)
lmtch to schedule temp-perm removal and PPM implant w/ Dr. Graciela HusbandsKlein

## 2017-03-01 NOTE — Telephone Encounter (Signed)
Wife informs me that patient is at [name] [phone])  until after PPM re-implantation next week. Spoke to [employer]lyssa w/ [name] - over viewed procedure instructions with her:  Date of procedure 11/9  [facility] at 6am  NPO after MN the night before  Hold medications the morning of procedure Labs were drawn at [name] on 10/29.  They will fax over results.

## 2017-03-01 NOTE — Telephone Encounter (Signed)
Reviewed labs with Dr. Graciela HusbandsKlein. Hgb 7.9 PA/MD at [name] aware. Redraw scheduled for 11/5.

## 2017-03-08 ENCOUNTER — Other Ambulatory Visit: Payer: Self-pay | Admitting: *Deleted

## 2017-03-08 NOTE — Patient Outreach (Signed)
[place] [facility]) Care Management  03/08/2017  Patrick Knox Feb 15, 1941 [id number]   Met with patient at facility, he will discharge later this week.  He will go home with wife and home care.  He reports he has [facility] brochure and does not feel he needs our services at this time.   Plan to sign off. [name], RN, BSN, [place] [phone]) Business Cell  [phone]) Toll Free Office

## 2017-03-10 ENCOUNTER — Ambulatory Visit (INDEPENDENT_AMBULATORY_CARE_PROVIDER_SITE_OTHER): Payer: Medicare PPO | Admitting: Nurse Practitioner

## 2017-03-10 ENCOUNTER — Telehealth: Payer: Self-pay | Admitting: Internal Medicine

## 2017-03-10 ENCOUNTER — Encounter: Payer: Self-pay | Admitting: Nurse Practitioner

## 2017-03-10 ENCOUNTER — Telehealth: Payer: Self-pay

## 2017-03-10 ENCOUNTER — Encounter: Payer: Self-pay | Admitting: *Deleted

## 2017-03-10 VITALS — BP 164/88 | HR 60 | Ht 73.0 in | Wt 151.4 lb

## 2017-03-10 DIAGNOSIS — I442 Atrioventricular block, complete: Secondary | ICD-10-CM

## 2017-03-10 NOTE — H&P (View-Only) (Signed)
Electrophysiology Office Note Date: 03/10/2017  ID:  Patrick Knox Ruedas, DOB 02/05/1941, MRN [id number]  PCP: Kirby FunkGriffin, John, MD Primary Cardiologist: Jens Somrenshaw Electrophysiologist: Graciela HusbandsKlein  CC: pacemaker followup  Patrick Knox Lascano is a 76 y.o. male seen today for Dr Graciela HusbandsKlein.  He presents today for routine electrophysiology followup.  He has a complicated history outlined well in note from 02/17/17.  At that time, his pacemaker extraction site was not completely healed and device re-implant was put off until tomorrow.  He has not had recent fevers or chills.  He denies chest pain, palpitations, dyspnea, PND, orthopnea, nausea, vomiting, dizziness, syncope, edema, weight gain, or early satiety.  Device History: STJ CRTP implanted 2017 for CHB, CHF; device system extraction 01/2017 2/2 MSSA bacteremia with temp perm pacemaker placed    Past Medical History:  Diagnosis Date  . Atrial fibrillation (HCC)   . CHF (congestive heart failure) (HCC)   . CKD (chronic kidney disease) stage 3, GFR 30-59 ml/min (HCC)   . High cholesterol   . Hypertension   . Hyponatremia 01/2017   Past Surgical History:  Procedure Laterality Date  . CORONARY ARTERY BYPASS GRAFT    . HERNIA REPAIR      Current Outpatient Medications  Medication Sig Dispense Refill  . carvedilol (COREG) 3.125 MG tablet Take 1 tablet (3.125 mg total) by mouth 2 (two) times daily. 60 tablet 0  . lisinopril (PRINIVIL,ZESTRIL) 5 MG tablet Take 5 mg by mouth daily.    . nitroGLYCERIN (NITROSTAT) 0.4 MG SL tablet Place 0.4 mg under the tongue every 5 (five) minutes as needed for chest pain.   1  . rosuvastatin (CRESTOR) 40 MG tablet Take 1 tablet (40 mg total) by mouth every morning. 90 tablet 3  . warfarin (COUMADIN) 5 MG tablet Take 5 mg daily by mouth.  3   No current facility-administered medications for this visit.     Allergies:   Patient has no known allergies.   Social History: Social History   Socioeconomic History  .  Marital status: Married    Spouse name: Not on file  . Number of children: Not on file  . Years of education: Not on file  . Highest education level: Not on file  Social Needs  . Financial resource strain: Not on file  . Food insecurity - worry: Not on file  . Food insecurity - inability: Not on file  . Transportation needs - medical: Not on file  . Transportation needs - non-medical: Not on file  Occupational History  . Not on file  Tobacco Use  . Smoking status: Current Every Day Smoker    Packs/day: 0.50    Types: Cigarettes  . Smokeless tobacco: Never Used  Substance and Sexual Activity  . Alcohol use: No  . Drug use: No  . Sexual activity: Not on file  Other Topics Concern  . Not on file  Social History Narrative  . Not on file    Family History: Family History  Problem Relation Age of Onset  . Hypertension Mother   . Hypertension Father   . Breast cancer Sister      Review of Systems: All other systems reviewed and are otherwise negative except as noted above.   Physical Exam: VS:  BP (!) 164/88   Pulse 60   Ht 6\' 1"  (1.854 m)   Wt 151 lb 6.4 oz (68.7 kg)   SpO2 99%   BMI 19.97 kg/m  , BMI Body mass index is 19.97 kg/m.  GEN- The patient is elderly, thin, chronically ill appearing, alert and oriented x 3 today.   HEENT: normocephalic, atraumatic; sclera clear, conjunctiva pink; hearing intact; oropharynx clear; neck supple  Lungs- Clear to ausculation bilaterally, normal work of breathing.  No wheezes, rales, rhonchi Heart- Regular rate and rhythm (paced) GI- soft, non-tender, non-distended, bowel sounds present  Extremities- no clubbing, cyanosis, or edema  MS- no significant deformity or atrophy Skin- warm and dry, no rash or lesion; PPM pocket extraction site with very small area not yet fully closed. This does not tunnel.  Temp perm site with tegarderm in place. Psych- euthymic mood, full affect Neuro- strength and sensation are intact  PPM  Interrogation- reviewed in detail today,  See PACEART report  EKG:  EKG is not ordered today.  Recent Labs: 01/21/2017: B Natriuretic Peptide 764.7 01/22/2017: TSH 1.867 01/24/2017: ALT 29 02/04/2017: BUN 24; Creatinine, Ser 1.07; Hemoglobin 9.6; Platelets 235; Potassium 4.4; Sodium 129   Wt Readings from Last 3 Encounters:  03/10/17 151 lb 6.4 oz (68.7 kg)  02/17/17 151 lb (68.5 kg)  02/05/17 140 lb 14.4 oz (63.9 kg)     Other studies Reviewed: Additional studies/ records that were reviewed today include: office notes, hospital records   Assessment and Plan:  1.  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 Scheduled for tomorrow with Dr Graciela HusbandsKlein I do think extraction site is healed enough at this point to proceed with re-implant. Will forward note to Dr Graciela HusbandsKlein for review and ask his nurse to call patient back this afternoon with final decision.    Current medicines are reviewed at length with the patient today.   The patient does not have concerns regarding his medicines.  The following changes were made today:  none  Labs/ tests ordered today include: pre-procedure labs - to be drawn via PICC tomorrow at hospital  No orders of the defined types were placed in this encounter.    Disposition:   Follow up with Dr Graciela HusbandsKlein [facility]     Signed, Gypsy BalsamAmber Seiler, NP 03/10/2017 10:26 AM  [facility] [address] [place] [address] [phone] (office) [phone] (fax)

## 2017-03-10 NOTE — Patient Instructions (Signed)
Medication Instructions:   Your physician recommends that you continue on your current medications as directed. Please refer to the Current Medication list given to you today.   If you need a refill on your cardiac medications before your next appointment, please call your pharmacy.  Labwork:  NONE ORDERED  TODAY    Testing/Procedures: SEE LETTER FOR DEVICE IMPLANTATION 03-11-2017    Follow-Up: AFTER 03-11-2017   10 DAY WOUND CHECK WITH DEVICE CLINIC  91 DAY PACER PHYS CHECK WITH DR Marikay AlarKLIEN    Any Other Special Instructions Will Be Listed Below (If Applicable).

## 2017-03-10 NOTE — Telephone Encounter (Signed)
Patient son returning your call.

## 2017-03-10 NOTE — Progress Notes (Signed)
Electrophysiology Office Note Date: 03/10/2017  ID:  Patrick Knox Ruedas, DOB 02/05/1941, MRN [id number]  PCP: Kirby FunkGriffin, John, MD Primary Cardiologist: Jens Somrenshaw Electrophysiologist: Graciela HusbandsKlein  CC: pacemaker followup  Patrick Knox Lascano is a 76 y.o. male seen today for Dr Graciela HusbandsKlein.  He presents today for routine electrophysiology followup.  He has a complicated history outlined well in note from 02/17/17.  At that time, his pacemaker extraction site was not completely healed and device re-implant was put off until tomorrow.  He has not had recent fevers or chills.  He denies chest pain, palpitations, dyspnea, PND, orthopnea, nausea, vomiting, dizziness, syncope, edema, weight gain, or early satiety.  Device History: STJ CRTP implanted 2017 for CHB, CHF; device system extraction 01/2017 2/2 MSSA bacteremia with temp perm pacemaker placed    Past Medical History:  Diagnosis Date  . Atrial fibrillation (HCC)   . CHF (congestive heart failure) (HCC)   . CKD (chronic kidney disease) stage 3, GFR 30-59 ml/min (HCC)   . High cholesterol   . Hypertension   . Hyponatremia 01/2017   Past Surgical History:  Procedure Laterality Date  . CORONARY ARTERY BYPASS GRAFT    . HERNIA REPAIR      Current Outpatient Medications  Medication Sig Dispense Refill  . carvedilol (COREG) 3.125 MG tablet Take 1 tablet (3.125 mg total) by mouth 2 (two) times daily. 60 tablet 0  . lisinopril (PRINIVIL,ZESTRIL) 5 MG tablet Take 5 mg by mouth daily.    . nitroGLYCERIN (NITROSTAT) 0.4 MG SL tablet Place 0.4 mg under the tongue every 5 (five) minutes as needed for chest pain.   1  . rosuvastatin (CRESTOR) 40 MG tablet Take 1 tablet (40 mg total) by mouth every morning. 90 tablet 3  . warfarin (COUMADIN) 5 MG tablet Take 5 mg daily by mouth.  3   No current facility-administered medications for this visit.     Allergies:   Patient has no known allergies.   Social History: Social History   Socioeconomic History  .  Marital status: Married    Spouse name: Not on file  . Number of children: Not on file  . Years of education: Not on file  . Highest education level: Not on file  Social Needs  . Financial resource strain: Not on file  . Food insecurity - worry: Not on file  . Food insecurity - inability: Not on file  . Transportation needs - medical: Not on file  . Transportation needs - non-medical: Not on file  Occupational History  . Not on file  Tobacco Use  . Smoking status: Current Every Day Smoker    Packs/day: 0.50    Types: Cigarettes  . Smokeless tobacco: Never Used  Substance and Sexual Activity  . Alcohol use: No  . Drug use: No  . Sexual activity: Not on file  Other Topics Concern  . Not on file  Social History Narrative  . Not on file    Family History: Family History  Problem Relation Age of Onset  . Hypertension Mother   . Hypertension Father   . Breast cancer Sister      Review of Systems: All other systems reviewed and are otherwise negative except as noted above.   Physical Exam: VS:  BP (!) 164/88   Pulse 60   Ht 6\' 1"  (1.854 m)   Wt 151 lb 6.4 oz (68.7 kg)   SpO2 99%   BMI 19.97 kg/m  , BMI Body mass index is 19.97 kg/m.  GEN- The patient is elderly, thin, chronically ill appearing, alert and oriented x 3 today.   HEENT: normocephalic, atraumatic; sclera clear, conjunctiva pink; hearing intact; oropharynx clear; neck supple  Lungs- Clear to ausculation bilaterally, normal work of breathing.  No wheezes, rales, rhonchi Heart- Regular rate and rhythm (paced) GI- soft, non-tender, non-distended, bowel sounds present  Extremities- no clubbing, cyanosis, or edema  MS- no significant deformity or atrophy Skin- warm and dry, no rash or lesion; PPM pocket extraction site with very small area not yet fully closed. This does not tunnel.  Temp perm site with tegarderm in place. Psych- euthymic mood, full affect Neuro- strength and sensation are intact  PPM  Interrogation- reviewed in detail today,  See PACEART report  EKG:  EKG is not ordered today.  Recent Labs: 01/21/2017: B Natriuretic Peptide 764.7 01/22/2017: TSH 1.867 01/24/2017: ALT 29 02/04/2017: BUN 24; Creatinine, Ser 1.07; Hemoglobin 9.6; Platelets 235; Potassium 4.4; Sodium 129   Wt Readings from Last 3 Encounters:  03/10/17 151 lb 6.4 oz (68.7 kg)  02/17/17 151 lb (68.5 kg)  02/05/17 140 lb 14.4 oz (63.9 kg)     Other studies Reviewed: Additional studies/ records that were reviewed today include: office notes, hospital records   Assessment and Plan:  1.  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 Scheduled for tomorrow with Dr Klein I do think extraction site is healed enough at this point to proceed with re-implant. Will forward note to Dr Klein for review and ask his nurse to call patient back this afternoon with final decision.    Current medicines are reviewed at length with the patient today.   The patient does not have concerns regarding his medicines.  The following changes were made today:  none  Labs/ tests ordered today include: pre-procedure labs - to be drawn via PICC tomorrow at hospital  No orders of the defined types were placed in this encounter.    Disposition:   Follow up with Dr Klein after PPM implant     Signed, Amber Seiler, NP 03/10/2017 10:26 AM  [facility] [address] [place] [address] [phone] (office) [phone] (fax)  

## 2017-03-10 NOTE — Telephone Encounter (Signed)
[facility] Patrick Knox[facility]  Left detailed message notifying that Dr. Graciela HusbandsKlein states ok to place device tomorrow 03/11/2017. Call placed to Pt wife per DPR.  Notified that Dr. Graciela HusbandsKlein states ok to place device 03/11/2017.  Verified arrival time of 6:00 am.  No special medication instructions.  Pt wife indicates understanding.

## 2017-03-10 NOTE — Telephone Encounter (Signed)
Returned son's call.  Notified that his parents were aware to go ahead with procedure 11/9.  Dr. Graciela HusbandsKlein in agreement.  No call back needed unless he has a question.  My direct # left.

## 2017-03-11 ENCOUNTER — Ambulatory Visit (HOSPITAL_COMMUNITY)
Admission: RE | Admit: 2017-03-11 | Discharge: 2017-03-11 | Disposition: A | Discharge status: Home / Self Care | Payer: Medicare PPO | Source: Ambulatory Visit | Attending: Emergency Medicine | Admitting: Emergency Medicine

## 2017-03-11 ENCOUNTER — Ambulatory Visit (HOSPITAL_COMMUNITY): Payer: Medicare PPO

## 2017-03-11 ENCOUNTER — Encounter (HOSPITAL_COMMUNITY)
Admission: RE | Disposition: A | Discharge status: Home / Self Care | Payer: Self-pay | Source: Ambulatory Visit | Attending: Emergency Medicine

## 2017-03-11 ENCOUNTER — Encounter (HOSPITAL_COMMUNITY): Payer: Self-pay

## 2017-03-11 ENCOUNTER — Other Ambulatory Visit: Payer: Self-pay

## 2017-03-11 DIAGNOSIS — J90 Pleural effusion, not elsewhere classified: Secondary | ICD-10-CM | POA: Diagnosis not present

## 2017-03-11 DIAGNOSIS — Z8249 Family history of ischemic heart disease and other diseases of the circulatory system: Secondary | ICD-10-CM | POA: Diagnosis not present

## 2017-03-11 DIAGNOSIS — Z538 Procedure and treatment not carried out for other reasons: Secondary | ICD-10-CM | POA: Insufficient documentation

## 2017-03-11 DIAGNOSIS — R1012 Left upper quadrant pain: Secondary | ICD-10-CM | POA: Insufficient documentation

## 2017-03-11 DIAGNOSIS — G8929 Other chronic pain: Secondary | ICD-10-CM | POA: Diagnosis not present

## 2017-03-11 DIAGNOSIS — N183 Chronic kidney disease, stage 3 (moderate): Secondary | ICD-10-CM | POA: Diagnosis not present

## 2017-03-11 DIAGNOSIS — M549 Dorsalgia, unspecified: Secondary | ICD-10-CM | POA: Diagnosis not present

## 2017-03-11 DIAGNOSIS — Z79899 Other long term (current) drug therapy: Secondary | ICD-10-CM | POA: Insufficient documentation

## 2017-03-11 DIAGNOSIS — I509 Heart failure, unspecified: Secondary | ICD-10-CM | POA: Diagnosis not present

## 2017-03-11 DIAGNOSIS — E871 Hypo-osmolality and hyponatremia: Secondary | ICD-10-CM | POA: Diagnosis not present

## 2017-03-11 DIAGNOSIS — I480 Paroxysmal atrial fibrillation: Secondary | ICD-10-CM | POA: Diagnosis not present

## 2017-03-11 DIAGNOSIS — N281 Cyst of kidney, acquired: Secondary | ICD-10-CM | POA: Diagnosis not present

## 2017-03-11 DIAGNOSIS — F17210 Nicotine dependence, cigarettes, uncomplicated: Secondary | ICD-10-CM | POA: Insufficient documentation

## 2017-03-11 DIAGNOSIS — Z951 Presence of aortocoronary bypass graft: Secondary | ICD-10-CM | POA: Insufficient documentation

## 2017-03-11 DIAGNOSIS — R0789 Other chest pain: Secondary | ICD-10-CM | POA: Insufficient documentation

## 2017-03-11 DIAGNOSIS — Z7901 Long term (current) use of anticoagulants: Secondary | ICD-10-CM | POA: Diagnosis not present

## 2017-03-11 DIAGNOSIS — I700 Atherosclerosis of aorta: Secondary | ICD-10-CM | POA: Diagnosis not present

## 2017-03-11 DIAGNOSIS — I255 Ischemic cardiomyopathy: Secondary | ICD-10-CM | POA: Diagnosis present

## 2017-03-11 DIAGNOSIS — I442 Atrioventricular block, complete: Secondary | ICD-10-CM | POA: Insufficient documentation

## 2017-03-11 DIAGNOSIS — Z959 Presence of cardiac and vascular implant and graft, unspecified: Secondary | ICD-10-CM | POA: Diagnosis present

## 2017-03-11 DIAGNOSIS — Z803 Family history of malignant neoplasm of breast: Secondary | ICD-10-CM | POA: Diagnosis not present

## 2017-03-11 DIAGNOSIS — I130 Hypertensive heart and chronic kidney disease with heart failure and stage 1 through stage 4 chronic kidney disease, or unspecified chronic kidney disease: Secondary | ICD-10-CM | POA: Insufficient documentation

## 2017-03-11 DIAGNOSIS — Z950 Presence of cardiac pacemaker: Secondary | ICD-10-CM | POA: Insufficient documentation

## 2017-03-11 DIAGNOSIS — R079 Chest pain, unspecified: Secondary | ICD-10-CM

## 2017-03-11 DIAGNOSIS — E7800 Pure hypercholesterolemia, unspecified: Secondary | ICD-10-CM | POA: Diagnosis not present

## 2017-03-11 DIAGNOSIS — I459 Conduction disorder, unspecified: Secondary | ICD-10-CM | POA: Diagnosis present

## 2017-03-11 LAB — COMPREHENSIVE METABOLIC PANEL
ALBUMIN: 2.9 g/dL — AB (ref 3.5–5.0)
ALK PHOS: 69 U/L (ref 38–126)
ALT: 5 U/L — ABNORMAL LOW (ref 17–63)
ANION GAP: 8 (ref 5–15)
AST: 19 U/L (ref 15–41)
BUN: 12 mg/dL (ref 6–20)
CALCIUM: 9 mg/dL (ref 8.9–10.3)
CO2: 22 mmol/L (ref 22–32)
Chloride: 107 mmol/L (ref 101–111)
Creatinine, Ser: 1.21 mg/dL (ref 0.61–1.24)
GFR calc Af Amer: >60 mL/min (ref >60)
GFR calc non Af Amer: 57 mL/min — ABNORMAL LOW (ref >60)
GLUCOSE: 109 mg/dL — AB (ref 65–99)
Potassium: 4.4 mmol/L (ref 3.5–5.1)
SODIUM: 137 mmol/L (ref 135–145)
Total Bilirubin: 0.8 mg/dL (ref 0.3–1.2)
Total Protein: 6.8 g/dL (ref 6.5–8.1)

## 2017-03-11 LAB — URINALYSIS, ROUTINE W REFLEX MICROSCOPIC
BILIRUBIN URINE: NEGATIVE
Glucose, UA: NEGATIVE mg/dL
Hgb urine dipstick: NEGATIVE
KETONES UR: NEGATIVE mg/dL
Leukocytes, UA: NEGATIVE
NITRITE: NEGATIVE
PH: 7 (ref 5.0–8.0)
Protein, ur: 100 mg/dL — AB
SPECIFIC GRAVITY, URINE: 1.011 (ref 1.005–1.030)
Squamous Epithelial / LPF: NONE SEEN

## 2017-03-11 LAB — CBC WITH DIFFERENTIAL/PLATELET
BASOS ABS: 0 10*3/uL (ref 0.0–0.1)
BASOS PCT: 0 %
EOS PCT: 2 %
Eosinophils Absolute: 0.1 10*3/uL (ref 0.0–0.7)
HCT: 32.4 % — ABNORMAL LOW (ref 39.0–52.0)
Hemoglobin: 10 g/dL — ABNORMAL LOW (ref 13.0–17.0)
LYMPHS ABS: 1.4 10*3/uL (ref 0.7–4.0)
LYMPHS PCT: 27 %
MCH: 27.5 pg (ref 26.0–34.0)
MCHC: 30.9 g/dL (ref 30.0–36.0)
MCV: 89 fL (ref 78.0–100.0)
Monocytes Absolute: 0.3 10*3/uL (ref 0.1–1.0)
Monocytes Relative: 5 %
NEUTROS PCT: 66 %
Neutro Abs: 3.4 10*3/uL (ref 1.7–7.7)
PLATELETS: 261 10*3/uL (ref 150–400)
RBC: 3.64 MIL/uL — AB (ref 4.22–5.81)
RDW: 16.8 % — AB (ref 11.5–15.5)
WBC: 5.2 10*3/uL (ref 4.0–10.5)

## 2017-03-11 LAB — BRAIN NATRIURETIC PEPTIDE: B NATRIURETIC PEPTIDE 5: 988.5 pg/mL — AB (ref 0.0–100.0)

## 2017-03-11 LAB — SURGICAL PCR SCREEN: MRSA, PCR: INVALID — AB

## 2017-03-11 LAB — LIPASE, BLOOD: Lipase: 20 U/L (ref 11–51)

## 2017-03-11 LAB — PROTIME-INR
INR: 1.18
PROTHROMBIN TIME: 15 s (ref 11.4–15.2)

## 2017-03-11 LAB — TROPONIN I: Troponin I: <0.03 ng/mL (ref <0.03)

## 2017-03-11 SURGERY — PACEMAKER IMPLANT
Anesthesia: LOCAL

## 2017-03-11 MED ORDER — ONDANSETRON HCL 4 MG/2ML IJ SOLN: 4.0000 mg | Freq: Once | INTRAMUSCULAR | Status: DC | PRN

## 2017-03-11 MED ORDER — CEFAZOLIN SODIUM-DEXTROSE 2-4 GM/100ML-% IV SOLN
INTRAVENOUS | Status: AC
  Filled 2017-03-11: qty 100

## 2017-03-11 MED ORDER — FUROSEMIDE 10 MG/ML IJ SOLN
40.0000 mg | Freq: Once | INTRAMUSCULAR | Status: AC
  Administered 2017-03-11: 40 mg via INTRAVENOUS
  Filled 2017-03-11: qty 4

## 2017-03-11 MED ORDER — CHLORHEXIDINE GLUCONATE 4 % EX LIQD
60.0000 mL | Freq: Once | CUTANEOUS | Status: DC
  Filled 2017-03-11: qty 60

## 2017-03-11 MED ORDER — HEPARIN (PORCINE) IN NACL 2-0.9 UNIT/ML-% IJ SOLN
INTRAMUSCULAR | Status: AC
  Filled 2017-03-11: qty 500

## 2017-03-11 MED ORDER — SODIUM CHLORIDE 0.9 % IR SOLN
Status: AC
  Filled 2017-03-11: qty 2

## 2017-03-11 MED ORDER — CEFAZOLIN SODIUM-DEXTROSE 2-4 GM/100ML-% IV SOLN
2.0000 g | INTRAVENOUS | Status: DC
  Filled 2017-03-11: qty 100

## 2017-03-11 MED ORDER — IOPAMIDOL (ISOVUE-300) INJECTION 61%
INTRAVENOUS | Status: AC
  Administered 2017-03-11: 100 mL
  Filled 2017-03-11: qty 100

## 2017-03-11 MED ORDER — ONDANSETRON HCL 4 MG/2ML IJ SOLN
4.0000 mg | Freq: Once | INTRAMUSCULAR | Status: AC
  Administered 2017-03-11: 4 mg via INTRAVENOUS

## 2017-03-11 MED ORDER — SODIUM CHLORIDE 0.9 % IV SOLN
INTRAVENOUS | Status: DC
  Administered 2017-03-11: 07:00:00 via INTRAVENOUS

## 2017-03-11 MED ORDER — ONDANSETRON 4 MG PO TBDP: 4.0000 mg | ORAL_TABLET | Freq: Three times a day (TID) | ORAL | 0 refills | Status: AC | PRN

## 2017-03-11 MED ORDER — SODIUM CHLORIDE 0.9 % IV BOLUS (SEPSIS)
500.0000 mL | Freq: Once | INTRAVENOUS | Status: AC
  Administered 2017-03-11: 500 mL via INTRAVENOUS

## 2017-03-11 MED ORDER — ONDANSETRON HCL 4 MG/2ML IJ SOLN
INTRAMUSCULAR | Status: AC
Start: 2017-03-11 — End: 2017-03-11
  Filled 2017-03-11: qty 2

## 2017-03-11 MED ORDER — LIDOCAINE HCL (PF) 1 % IJ SOLN
INTRAMUSCULAR | Status: AC
  Filled 2017-03-11: qty 60

## 2017-03-11 MED ORDER — MUPIROCIN 2 % EX OINT
TOPICAL_OINTMENT | CUTANEOUS | Status: AC
  Administered 2017-03-11: 07:00:00
  Filled 2017-03-11: qty 22

## 2017-03-11 MED ORDER — SODIUM CHLORIDE 0.9 % IR SOLN: 80.0000 mg | Status: DC

## 2017-03-11 NOTE — Progress Notes (Signed)
Transferring patient to ED per Dr. Graciela HusbandsKlein.

## 2017-03-11 NOTE — ED Provider Notes (Signed)
[facility] Provider Note   CSN: [id number] Arrival date & time: 03/11/17  0901     History   Chief Complaint Chief Complaint  Patient presents with  . Abdominal Pain    HPI Patrick Knox is a 76 y.o. male.  HPI  76 year old male with a history of CHF, CKD, A. fib on warfarin presents from the OR.  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  He started feeling nauseated just prior to the procedure as well as having some left lower chest pain and so he was sent here.  The patient states that the pain he has is left upper abdomen/left lower chest.  It started yesterday afternoon but was milder.  He would be able to take a deep breath and it seemed to improve.  However this morning that did not seem to help.  He describes as a sharp pain, about 8 out of 10.  Nausea resolved with Zofran in the OR but then seemed to come back but now is currently resolved.  During the exam, the patient denies any current pain.  There is no pleuritic pain.  He has not had any vomiting.  He has had a small bowel obstruction before but this feels nothing like it.  He is never had any symptoms like this before.  Has chronic back pain but no new back pain with this.  No diarrhea or fevers.  Has a chronic mild cough for the last several weeks but no shortness of breath.  Past Medical History:  Diagnosis Date  . Atrial fibrillation (HCC)   . CHF (congestive heart failure) (HCC)   . CKD (chronic kidney disease) stage 3, GFR 30-59 ml/min (HCC)   . High cholesterol   . Hypertension   . Hyponatremia 01/2017    Patient Active Problem List   Diagnosis Date Noted  . Cardiac device in situ   . Chronic systolic CHF (congestive heart failure) (HCC) 01/21/2017  . Hyponatremia 01/21/2017  . Elevated troponin 01/21/2017  . Tobacco abuse 01/21/2017  . Encounter for therapeutic drug monitoring  06/21/2016  . Paroxysmal atrial fibrillation (HCC) 06/08/2016  . Cardiomyopathy, ischemic   . 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 Heart block   . Hypertension   . High cholesterol     Past Surgical History:  Procedure Laterality Date  . CORONARY ARTERY BYPASS GRAFT    . HERNIA REPAIR         Home Medications    Prior to Admission medications   Medication Sig Start Date End Date Taking? Authorizing Provider  carvedilol (COREG) 3.125 MG tablet Take 1 tablet (3.125 mg total) by mouth 2 (two) times daily. 02/04/17  Yes Alwyn RenMathews, Elizabeth G, MD  lisinopril (PRINIVIL,ZESTRIL) 5 MG tablet Take 5 mg by mouth daily. 12/01/16  Yes [provider]  nitroGLYCERIN (NITROSTAT) 0.4 MG SL tablet Place 0.4 mg under the tongue every 5 (five) minutes as needed for chest pain.  12/30/16  Yes [provider]  rosuvastatin (CRESTOR) 40 MG tablet Take 1 tablet (40 mg total) by mouth every morning. 02/25/16  Yes Lewayne Buntingrenshaw, Brian S, MD  warfarin (COUMADIN) 5 MG tablet Take 5 mg daily by mouth. 01/12/17  Yes [provider]  ondansetron (ZOFRAN ODT) 4 MG disintegrating tablet Take 1 tablet (4 mg total) every 8 (eight) hours as needed by mouth for nausea or vomiting. 03/11/17  Pricilla LovelessGoldston, Mckinzey Entwistle, MD    Family History Family History  Problem Relation Age of Onset  . Hypertension Mother   . Hypertension Father   . Breast cancer Sister     Social History Social History   Tobacco Use  . Smoking status: Current Every Day Smoker    Packs/day: 0.50    Types: Cigarettes  . Smokeless tobacco: Never Used  . Tobacco comment: "trying to quit"  Substance Use Topics  . Alcohol use: No  . Drug use: No     Allergies   Patient has no known allergies.   Review of Systems Review of Systems  Constitutional: Negative for fever.  Respiratory: Positive for cough. Negative for shortness of breath.   Cardiovascular: Positive for chest pain. Negative for leg swelling.    Gastrointestinal: Positive for nausea. Negative for abdominal pain, diarrhea and vomiting.  All other systems reviewed and are negative.    Physical Exam Updated Vital Signs BP (!) 154/98   Pulse (!) 59   Temp 98.3 F (36.8 C) (Oral)   Resp 16   Ht 6\' 1"  (1.854 m)   Wt 64 kg (141 lb)   SpO2 100%   BMI 18.60 kg/m   Physical Exam  Constitutional: He is oriented to person, place, and time. He appears well-developed and well-nourished.  Non-toxic appearance. He does not appear ill.  HENT:  Head: Normocephalic and atraumatic.  Right Ear: External ear normal.  Left Ear: External ear normal.  Nose: Nose normal.  Eyes: Right eye exhibits no discharge. Left eye exhibits no discharge.  Neck: Neck supple.  Cardiovascular: Normal rate, regular rhythm and normal heart sounds.  Pulmonary/Chest: Effort normal and breath sounds normal. He exhibits no tenderness.    Abdominal: Soft. There is no tenderness.  Musculoskeletal: He exhibits no edema.  Neurological: He is alert and oriented to person, place, and time.  Skin: Skin is warm and dry.  Nursing note and vitals reviewed.    ED Treatments / Results  Labs (all labs ordered are listed, but only abnormal results are displayed) Labs Reviewed  SURGICAL PCR SCREEN - Abnormal; Notable for the following components:      Result Value   MRSA, PCR INVALID RESULTS, SPECIMEN SENT FOR CULTURE (*)    Staphylococcus aureus RESULT CALLED TO, READ BACK BY AND VERIFIED WITH: (*)    All other components within normal limits  COMPREHENSIVE METABOLIC PANEL - Abnormal; Notable for the following components:   Glucose, Bld 109 (*)    Albumin 2.9 (*)    ALT 5 (*)    GFR calc non Af Amer 57 (*)    All other components within normal limits  CBC WITH DIFFERENTIAL/PLATELET - Abnormal; Notable for the following components:   RBC 3.64 (*)    Hemoglobin 10.0 (*)    HCT 32.4 (*)    RDW 16.8 (*)    All other components within normal limits  URINALYSIS,  ROUTINE W REFLEX MICROSCOPIC - Abnormal; Notable for the following components:   Protein, ur 100 (*)    Bacteria, UA RARE (*)    All other components within normal limits  BRAIN NATRIURETIC PEPTIDE - Abnormal; Notable for the following components:   B Natriuretic Peptide 988.5 (*)    All other components within normal limits  MRSA CULTURE  LIPASE, BLOOD  TROPONIN I  PROTIME-INR    EKG  EKG Interpretation  Date/Time:  Friday March 11 2017 09:07:39 EST Ventricular Rate:  60 PR Interval:  QRS Duration: 191 QT Interval:  515 QTC Calculation: 515 R Axis:   -46 Text Interpretation:  Ventricular-paced rhythm No further analysis attempted due to paced rhythm Confirmed by Pricilla Loveless [phone]) on 03/11/2017 9:15:49 AM Also confirmed by Pricilla Loveless [phone]), editor Elita Quick ([id number])  on 03/11/2017 9:35:22 AM       Radiology Dg Chest 2 View  Result Date: 03/11/2017 CLINICAL DATA:  Recent nausea EXAM: CHEST  2 VIEW COMPARISON:  02/01/2017 FINDINGS: Cardiac shadow is enlarged but stable. Postsurgical changes are again seen and stable. Right-sided PICC line is again identified but has withdrawn slightly to the proximal superior vena cava. Temporary pacemaker is noted unchanged from prior study. The lungs are well aerated without pneumothorax. Small posteriorly layering effusions right greater than left are noted. Underlying atelectatic changes are present as well. Previously seen patchy changes in the right upper lobe resolved. IMPRESSION: Bilateral pleural effusions and bibasilar atelectatic changes right greater than left. Electronically Signed   By: Alcide Clever M.D.   On: 03/11/2017 09:56   Ct Abdomen Pelvis W Contrast  Result Date: 03/11/2017 CLINICAL DATA:  Patient arrived to cath lab with nausea and gagging. Left rib pain with back pain and abdominal pain. EXAM: CT ABDOMEN AND PELVIS WITH CONTRAST TECHNIQUE: Multidetector CT imaging of the abdomen and pelvis was  performed using the standard protocol following bolus administration of intravenous contrast. CONTRAST:  ISOVUE-300 IOPAMIDOL (ISOVUE-300) INJECTION 61% COMPARISON:  CT 12/19/2015 FINDINGS: Lower chest: Lung bases demonstrate a moderate size right effusion and tiny amount of left pleural fluid with associated basilar atelectasis. Mild cardiomegaly with cardiac pacer leads present. Median sternotomy wires are present. Hepatobiliary: Subcentimeter hypodensity over the left lobe of the liver too small to characterize but likely a cyst. Gallbladder is contracted. Biliary tree is within normal. Pancreas: Within normal. Spleen: Within normal. Adrenals/Urinary Tract: Adrenal glands are normal. Kidneys are normal in size without hydronephrosis or nephrolithiasis. Stable 1.4 cm cyst over the upper pole left kidney. Ureters and bladder are within normal. Stomach/Bowel: Stomach and small bowel are unremarkable. Appendix is normal. Colon is within normal. Vascular/Lymphatic: There is mild calcified plaque over the abdominal aorta and iliac arteries. No significant adenopathy. Reproductive: Radiation seed implants over the prostate gland. Other: No free fluid or focal inflammatory change. Musculoskeletal: Moderate spondylosis of the spine with multilevel disc disease over the lumbar spine. Mild degenerate change of the hips. IMPRESSION: No acute findings in the abdomen/pelvis. Moderate size right effusion and tiny amount of left pleural fluid with associated basilar atelectasis. Stable 1.4 cm left renal cyst. Subcentimeter left liver hypodensity too small to characterize but likely a cyst. Aortic Atherosclerosis (ICD10-I70.0). Electronically Signed   By: Elberta Fortis M.D.   On: 03/11/2017 15:20   Dg Abd 2 Views  Result Date: 03/11/2017 CLINICAL DATA:  Nausea and pain EXAM: ABDOMEN - 2 VIEW COMPARISON:  None. FINDINGS: Scattered large and small bowel gas is noted. No free air is seen. No obstructive changes are noted.  Degenerative changes of the lumbar spine are noted. Prostate brachytherapy seeds are seen. IMPRESSION: No acute abnormality noted. Electronically Signed   By: Alcide Clever M.D.   On: 03/11/2017 09:58    Procedures Procedures (including critical care time)  Medications Ordered in ED Medications  ondansetron (ZOFRAN) injection 4 mg (not administered)  mupirocin ointment (BACTROBAN) 2 % (  Given 03/11/17 0724)  ondansetron (ZOFRAN) injection 4 mg (4 mg Intravenous Given 03/11/17 0749)  sodium chloride 0.9 % bolus 500  mL (0 mLs Intravenous Stopped 03/11/17 1022)  furosemide (LASIX) injection 40 mg (40 mg Intravenous Given 03/11/17 1225)  iopamidol (ISOVUE-300) 61 % injection (100 mLs  Contrast Given 03/11/17 1431)     Initial Impression / Assessment and Plan / ED Course  I have reviewed the triage vital signs and the nursing notes.  Pertinent labs & imaging results that were available during my care of the patient were reviewed by me and considered in my medical decision making (see chart for details).     Patient's pain was apparently pretty severe while waiting in the operating room prior to come to the ED per Dr Graciela Husbands.  While he has no reproducible pain now, given significant past medical history a CT was obtained that shows no acute pathology.  I did discuss his case with his cardiologist, Dr. Graciela Husbands, and after discussion we will give a dose of IV Lasix and he will follow the patient up as an outpatient.  Advises against discharging with Lasix.  The patient has ambulated without difficulty and has no complaints of shortness of breath and no hypoxia.  Unclear how long these pleural effusions have been there.  Otherwise, he will be given Zofran for nausea at home but otherwise has no complaints.  Doubt this was ACS, PE.  Highly doubt dissection or aneurysm.  No signs of obstruction.  Discharge home with return precautions.  Final Clinical Impressions(s) / ED Diagnoses   Final diagnoses:  Left  sided chest pain  Left upper quadrant abdominal pain of unknown etiology  Bilateral pleural effusion    ED Discharge Orders        Ordered    ondansetron (ZOFRAN ODT) 4 MG disintegrating tablet  Every 8 hours PRN     03/11/17 1553       Pricilla Loveless, MD 03/11/17 1648

## 2017-03-11 NOTE — ED Notes (Signed)
ED Provider at bedside. 

## 2017-03-11 NOTE — ED Notes (Signed)
MD Advises to stop fluids r/t imaging

## 2017-03-11 NOTE — Progress Notes (Addendum)
Pt in cath holding pre PTVP insertion. Developed nausea . Dr Graciela HusbandsKlein paged. V.O obtained. 4 mg zofran given IV. Pt reports left lower quadrant pain 7/10 since yesterday. Nausea stated in cath holding. Dr Graciela HusbandsKlein is in to see patient.

## 2017-03-11 NOTE — Interval H&P Note (Signed)
History and Physical Interval Note:  03/11/2017 7:48 AM  Lanice SchwabHarvie Scovill  has presented today for surgery, with the diagnosis of bradycardia  The various methods of treatment have been discussed with the patient and family. After consideration of risks, benefits and other options for treatment, the patient has consented to  Procedure(s): PACEMAKER IMPLANT (N/A) PPM GENERATOR REMOVAL (N/A) as a surgical intervention .  The patient's history has been reviewed, patient examined, no change in status, stable for surgery.  I have reviewed the patient's chart and labs.  Questions were answered to the patient's satisfaction.     Sherryl MangesSteven Klein S/p extraction for infected device having presented with sepsis, mental status changes and found to have MSSA bacteremia   There had been slow healing of extraction site  Now here for reimplantation of pacemaker-- report suggested a single high lateral vein, may be occluded but will attempt CRT remimplant  The benefits and risks were reviewed including but not limited to death,  perforation, infection, lead dislodgement and device malfunction.  The patient understands agrees and is willing to proceed.

## 2017-03-11 NOTE — Progress Notes (Signed)
As of 0830 patient is pain free and being observed in cath holding. Nausea is gone as of now.

## 2017-03-11 NOTE — ED Notes (Signed)
Ambulated pt in hallway, pt ambulated with steady gait and SpO2 stayed 99%. Notified Dr. Criss AlvineGoldston

## 2017-03-11 NOTE — Interval H&P Note (Signed)
History and Physical Interval Note:  03/11/2017 8:27 AM  Lanice SchwabHarvie Gavel  has presented today for surgery, with the diagnosis of bradycardia  The various methods of treatment have been discussed with the patient and family. After consideration of risks, benefits and other options for treatment, the patient has consented to  Procedure(s): PACEMAKER IMPLANT (N/A) PPM GENERATOR REMOVAL (N/A) as a surgical intervention .  The patient's history has been reviewed, patient examined, no change in status, stable for surgery.  I have reviewed the patient's chart and labs.  Questions were answered to the patient's satisfaction.     Sherryl MangesSteven Hilde Churchman  Pt for CRT reimplant and removal of temp perm.  PICC in place This am developed nausea >> assoc with LUQ pain.  Apparently started yesterday and resolved only to recur this am accompanied by nausea Denies prior similar events but hx of SBO in past  Will send to er  PE now notable for +BS and soft abdomen Small punctate lesion on L incision   Spoke with ER Dr Patria Maneampos who will faciliate evaluation

## 2017-03-11 NOTE — ED Notes (Signed)
Notified Lab of need for BNP

## 2017-03-11 NOTE — ED Notes (Signed)
CT made aware pt is ready for scan

## 2017-03-11 NOTE — ED Triage Notes (Signed)
Pt arrived from cath lab where pt was intending to have pacemaker placement today. Once arrived to cath lab, pt was having nausea and gagging (reporting he had nothing to throw up). C/O sharpe pain in left ribs and back. Pt has external temporary pacemaker and PICC Line on left upper arm.

## 2017-03-11 NOTE — ED Notes (Signed)
Patient able to ambulate independently  

## 2017-03-11 NOTE — ED Notes (Signed)
Patient transported to X-ray 

## 2017-03-13 LAB — MRSA CULTURE: CULTURE: NOT DETECTED

## 2017-03-14 ENCOUNTER — Telehealth: Payer: Self-pay | Admitting: Internal Medicine

## 2017-03-14 NOTE — Telephone Encounter (Signed)
Post ED Visit - Positive Culture Follow-up  Culture report reviewed by antimicrobial stewardship pharmacist:  []  Patrick Knox, Pharm.D. []  Patrick Knox, Pharm.D., BCPS AQ-ID []  Patrick Knox, Pharm.D., BCPS [x]  Patrick Knox, [address] BCPS []  Patrick Knox, [address] BCPS, AAHIVP []  Patrick Knox, Pharm.D., BCPS, AAHIVP []  Patrick Knox, PharmD, BCPS []  Patrick Knox, PharmD, BCPS []  Patrick Knox, PharmD, BCPS  Positive mrsa culture Treated with none, negative, no further patient follow-up is required at this time.  Patrick Knox, Patrick Knox 03/14/2017, 12:27 PM

## 2017-03-14 NOTE — Telephone Encounter (Signed)
Patient calling to see if he can reschedule his Pacemaker Implant that was cancelled on 03/11/17. He states that when he went in for his procedure that he had other complications that prevented him from getting it done. Made patient aware that a message will be sent to Dr. Graciela HusbandsKlein for review and approval. Patient verbalized understanding and thanked me for the call.

## 2017-03-14 NOTE — Telephone Encounter (Signed)
New Message     Pt calling to reschedule having device implanted, it was cancelled on November 9th, please call

## 2017-03-15 ENCOUNTER — Inpatient Hospital Stay: Payer: Medicare PPO | Admitting: Infectious Diseases

## 2017-03-16 ENCOUNTER — Telehealth: Payer: Self-pay | Admitting: Internal Medicine

## 2017-03-16 NOTE — Telephone Encounter (Signed)
New Message     Do you have a date for him to have his device changed yet?  Needs to have appts cancelled if not.

## 2017-03-16 NOTE — Telephone Encounter (Signed)
I spoke with pt. Pt was scheduled to have pacemaker implanted on 11/9 but when he arrived for procedure had abdominal pain and was evaluated in ED.  He is calling to see when Dr. Graciela HusbandsKlein would like to reschedule procedure.  I told him I would cancel previously scheduled appointments for wound check and Feb follow up with Dr. Graciela HusbandsKlein Will send message to Dr. Raiford NobleKlein/Amber Seiler regarding timing of procedure.

## 2017-03-17 ENCOUNTER — Ambulatory Visit: Payer: Medicare PPO | Admitting: Internal Medicine

## 2017-03-17 NOTE — Telephone Encounter (Signed)
Called pateint to schedule Temp/Perm Explant and PPM implant.  Pt is agreable to implant date [facility] for a 10:30 am procedure Went over pre procedure instructions with patient. He verbalized understanding.  Pt had labs in hospital on 11/9 so no need for new labs.  Pt is scheduled for wound check on 04/06/17  Pt is scheduled for 91 day post implant OV with Dr. Graciela HusbandsKlein 2/22/119. He is aware and agreeable to all information.

## 2017-03-23 ENCOUNTER — Ambulatory Visit: Payer: Medicare PPO

## 2017-03-23 ENCOUNTER — Ambulatory Visit (HOSPITAL_COMMUNITY): Admission: RE | Admit: 2017-03-23 | Payer: Medicare PPO | Source: Ambulatory Visit | Admitting: Internal Medicine

## 2017-03-23 SURGERY — PACEMAKER IMPLANT

## 2017-04-02 DEATH — deceased

## 2017-04-06 ENCOUNTER — Ambulatory Visit: Payer: Medicare PPO

## 2017-06-10 ENCOUNTER — Encounter: Payer: Medicare PPO | Admitting: Internal Medicine

## 2017-06-29 ENCOUNTER — Encounter: Payer: Medicare PPO | Admitting: Internal Medicine

## 2018-10-09 IMAGING — CT CT HEAD W/O CM
4 series · 17 of 47 positions shown, 19 images · non-contrast
Comparison: None.

CLINICAL DATA: Altered level consciousness.

EXAM:
CT HEAD WITHOUT CONTRAST
TECHNIQUE: Contiguous axial images were obtained from the base of the skull
through the vertex without intravenous contrast.

[Series 3: head without · axial · non-contrast · 0.41mm/px · z∈[-71,+49]mm · 7 of 33 slices shown, 9 images]
[im 5/33  brain]
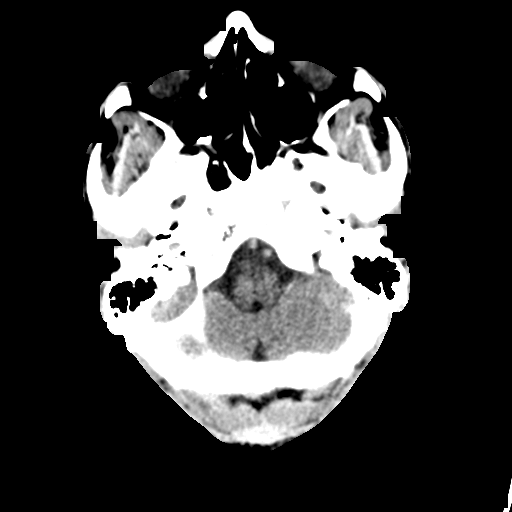
[im 5/33  bone]
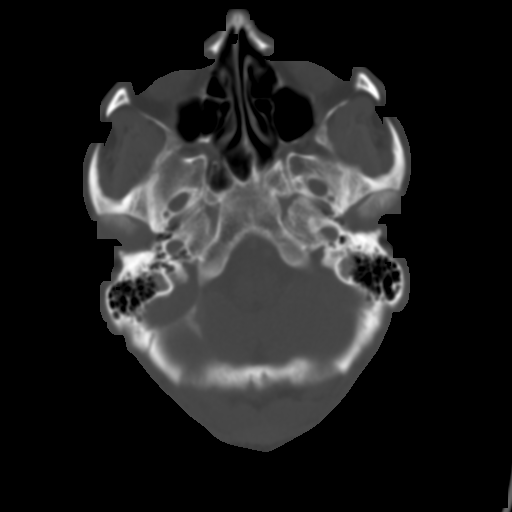
[im 9/33  brain]
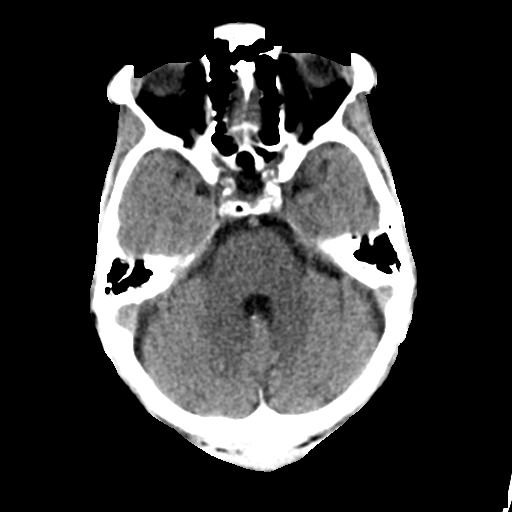
[im 13/33  brain]
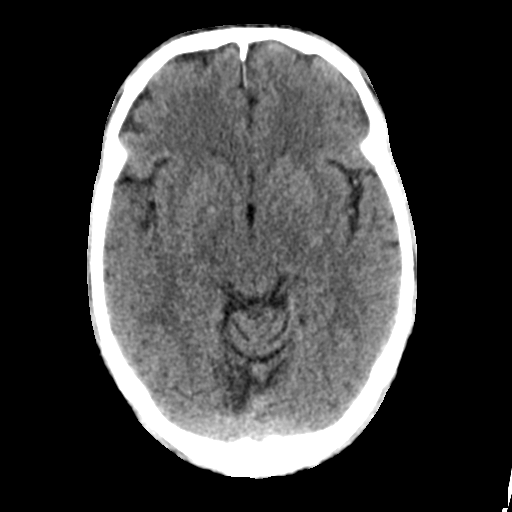
[im 17/33  brain]
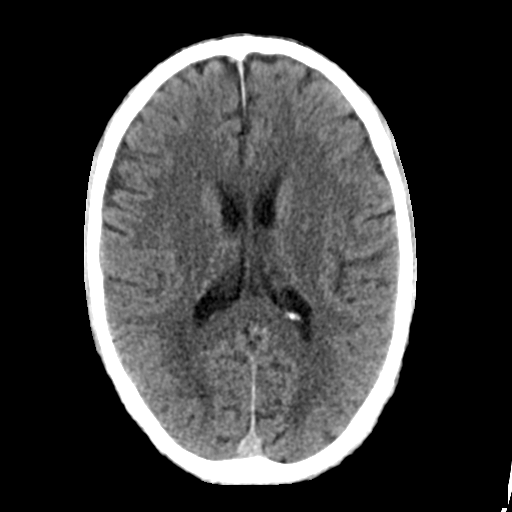
[im 21/33  brain]
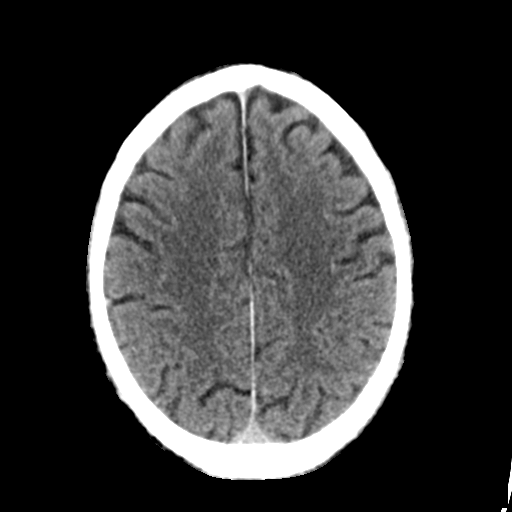
[im 21/33  bone]
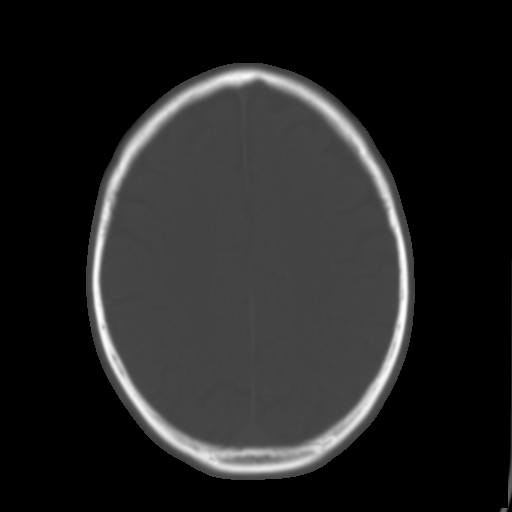
[im 25/33  brain]
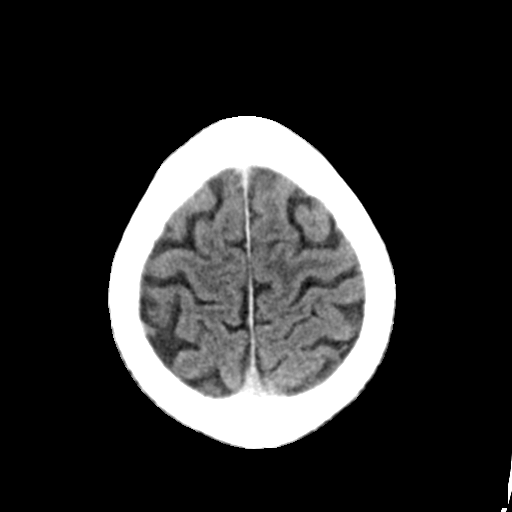
[im 29/33  brain]
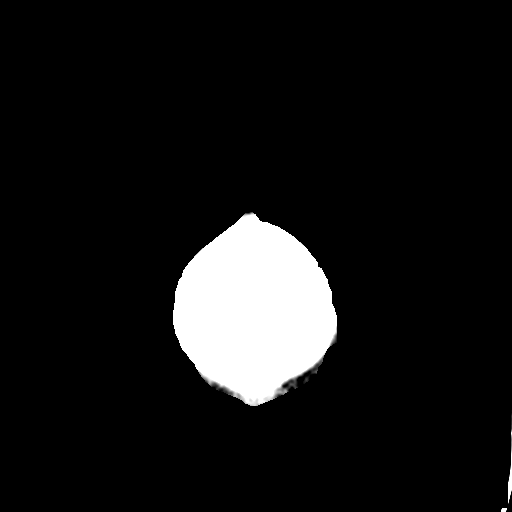

[Series 4: head bone · axial · 0.41mm/px · z∈[-75,-19]mm · 4 of 82 slices shown]
[im 9/82  bone]
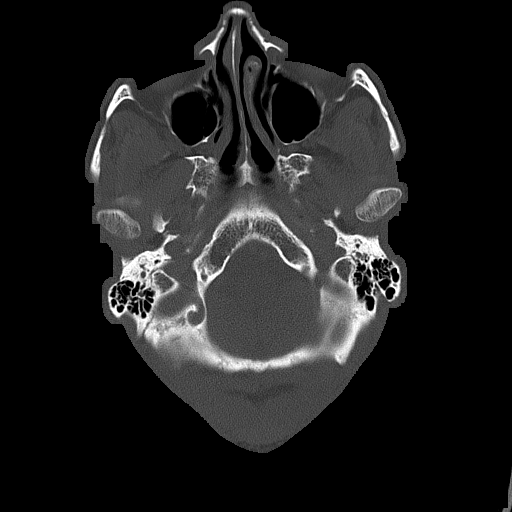
[im 17/82  bone]
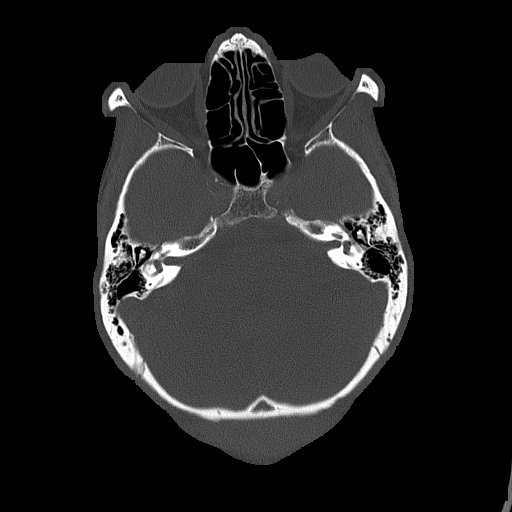
[im 25/82  bone]
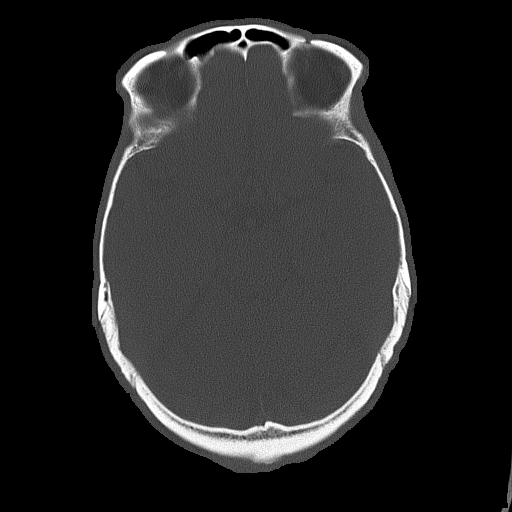
[im 37/82  bone]
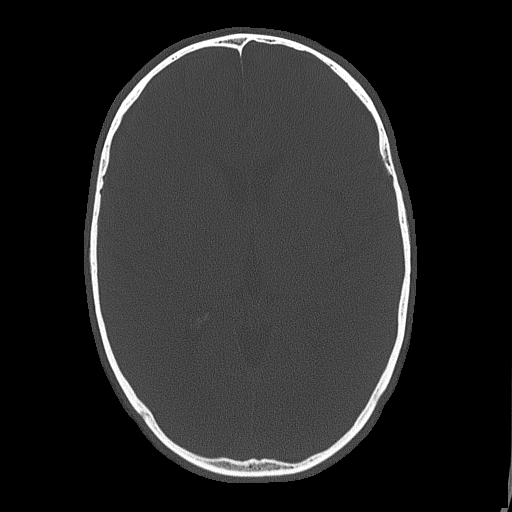

[Series 5: head without cor · coronal · non-contrast · 0.30mm/px · 3 of 70 slices shown]
[im 24/70  brain]
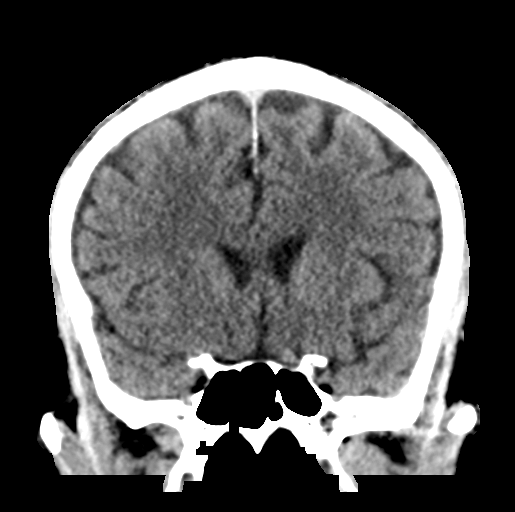
[im 31/70  brain]
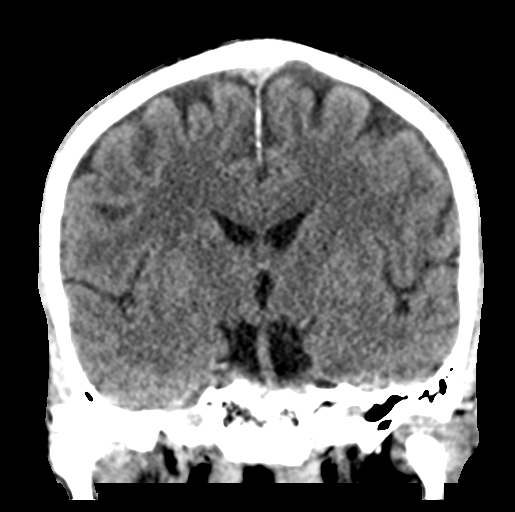
[im 39/70  brain]
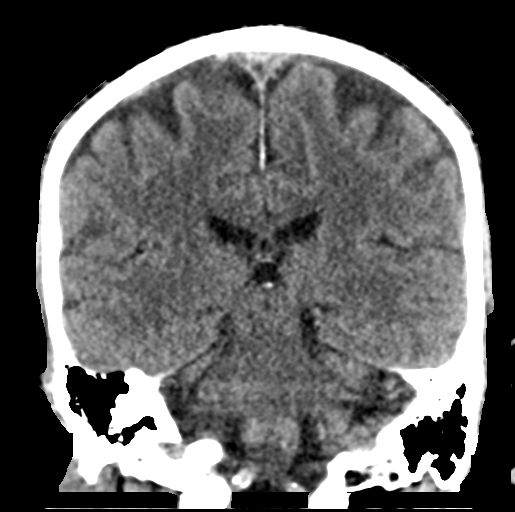

[Series 6: head without sag · sagittal · non-contrast · 0.31mm/px · 3 of 53 slices shown]
[im 18/53  brain]
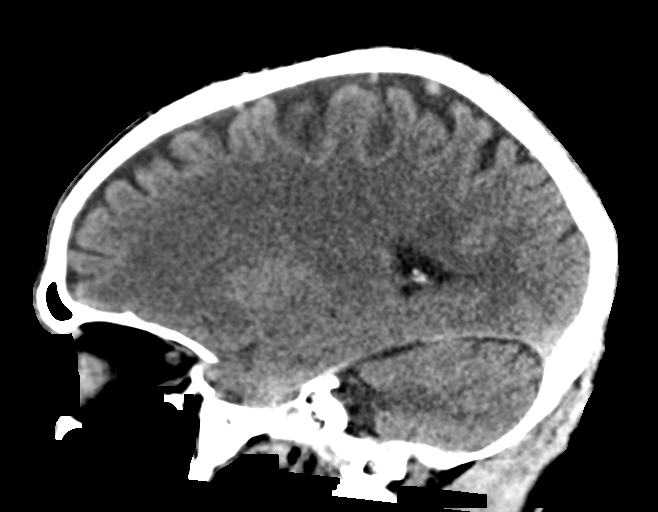
[im 27/53  brain]
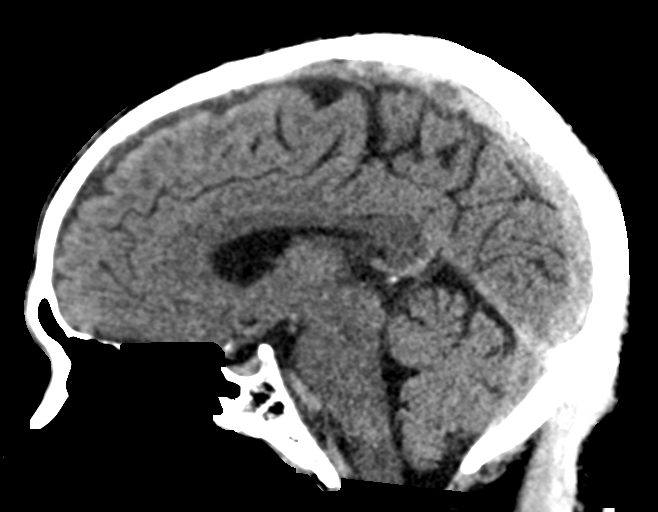
[im 35/53  brain]
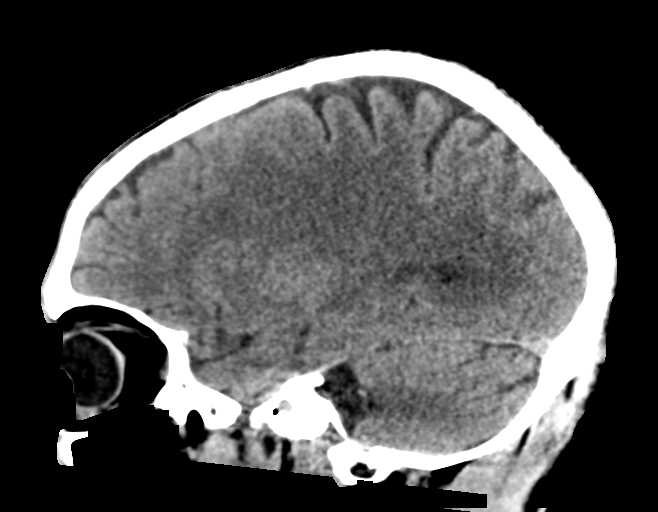

[17 of 47 positions shown; findings below may reference images not displayed]

FINDINGS: Brain: No acute intracranial hemorrhage. No focal mass lesion. No CT
evidence of acute infarction. No midline shift or mass effect. No
hydrocephalus. Basilar cisterns are patent.

Vascular: No hyperdense vessel or unexpected calcification.

Skull: Normal. Negative for fracture or focal lesion.

Sinuses/Orbits: Paranasal sinuses and mastoid air cells are clear.
Orbits are clear.

Other: None.
IMPRESSION: No acute intracranial findings.
